# Patient Record
Sex: Male | Born: 1965 | Race: White | Hispanic: No | Marital: Married | Smoking: Former smoker
Health system: Southern US, Community
[De-identification: ages and names within clinical notes are randomized; demographics above are authoritative.]

## PROBLEM LIST (undated history)

## (undated) DIAGNOSIS — E785 Hyperlipidemia, unspecified: Secondary | ICD-10-CM

## (undated) DIAGNOSIS — R002 Palpitations: Secondary | ICD-10-CM

## (undated) DIAGNOSIS — R609 Edema, unspecified: Secondary | ICD-10-CM

## (undated) DIAGNOSIS — I447 Left bundle-branch block, unspecified: Secondary | ICD-10-CM

## (undated) DIAGNOSIS — I2510 Atherosclerotic heart disease of native coronary artery without angina pectoris: Secondary | ICD-10-CM

## (undated) DIAGNOSIS — E118 Type 2 diabetes mellitus with unspecified complications: Secondary | ICD-10-CM

## (undated) DIAGNOSIS — I700 Atherosclerosis of aorta: Secondary | ICD-10-CM

## (undated) DIAGNOSIS — I10 Essential (primary) hypertension: Secondary | ICD-10-CM

## (undated) DIAGNOSIS — G8929 Other chronic pain: Secondary | ICD-10-CM

## (undated) DIAGNOSIS — J449 Chronic obstructive pulmonary disease, unspecified: Secondary | ICD-10-CM

## (undated) DIAGNOSIS — M5135 Other intervertebral disc degeneration, thoracolumbar region: Secondary | ICD-10-CM

## (undated) DIAGNOSIS — K635 Polyp of colon: Secondary | ICD-10-CM

## (undated) DIAGNOSIS — Z1379 Encounter for other screening for genetic and chromosomal anomalies: Principal | ICD-10-CM

## (undated) DIAGNOSIS — M109 Gout, unspecified: Secondary | ICD-10-CM

## (undated) DIAGNOSIS — I872 Venous insufficiency (chronic) (peripheral): Secondary | ICD-10-CM

## (undated) DIAGNOSIS — E119 Type 2 diabetes mellitus without complications: Secondary | ICD-10-CM

## (undated) DIAGNOSIS — G4733 Obstructive sleep apnea (adult) (pediatric): Secondary | ICD-10-CM

## (undated) DIAGNOSIS — I471 Supraventricular tachycardia, unspecified: Secondary | ICD-10-CM

## (undated) DIAGNOSIS — M47814 Spondylosis without myelopathy or radiculopathy, thoracic region: Secondary | ICD-10-CM

## (undated) DIAGNOSIS — K219 Gastro-esophageal reflux disease without esophagitis: Secondary | ICD-10-CM

## (undated) DIAGNOSIS — C4492 Squamous cell carcinoma of skin, unspecified: Secondary | ICD-10-CM

## (undated) DIAGNOSIS — R600 Localized edema: Secondary | ICD-10-CM

## (undated) HISTORY — DX: Venous insufficiency (chronic) (peripheral): I87.2

## (undated) HISTORY — DX: Gout, unspecified: M10.9

## (undated) HISTORY — DX: Chronic obstructive pulmonary disease, unspecified: J44.9

## (undated) HISTORY — DX: Left bundle-branch block, unspecified: I44.7

## (undated) HISTORY — DX: Palpitations: R00.2

## (undated) HISTORY — PX: WISDOM TOOTH EXTRACTION: SHX21

## (undated) HISTORY — DX: Encounter for other screening for genetic and chromosomal anomalies: Z13.79

## (undated) HISTORY — DX: Supraventricular tachycardia, unspecified: I47.10

## (undated) HISTORY — DX: Squamous cell carcinoma of skin, unspecified: C44.92

## (undated) HISTORY — DX: Gastro-esophageal reflux disease without esophagitis: K21.9

## (undated) HISTORY — DX: Morbid (severe) obesity due to excess calories: E66.01

## (undated) HISTORY — DX: Polyp of colon: K63.5

## (undated) HISTORY — DX: Type 2 diabetes mellitus with unspecified complications: E11.8

---

## 2011-11-09 ENCOUNTER — Ambulatory Visit: Payer: Self-pay | Admitting: Family

## 2014-08-28 ENCOUNTER — Encounter: Payer: Self-pay | Admitting: Surgery

## 2014-09-08 ENCOUNTER — Encounter: Payer: Self-pay | Admitting: Surgery

## 2014-10-07 ENCOUNTER — Encounter
Admit: 2014-10-07 | Disposition: A | Discharge status: Home / Self Care | Payer: Self-pay | Attending: Surgery | Admitting: Surgery

## 2014-11-07 ENCOUNTER — Encounter
Admit: 2014-11-07 | Disposition: A | Discharge status: Home / Self Care | Payer: Self-pay | Attending: Surgery | Admitting: Surgery

## 2015-01-12 ENCOUNTER — Other Ambulatory Visit: Payer: Self-pay

## 2015-01-12 MED ORDER — PANTOPRAZOLE SODIUM 40 MG PO TBEC: 40.0000 mg | DELAYED_RELEASE_TABLET | Freq: Every day | ORAL | Status: DC

## 2015-01-12 MED ORDER — ALLOPURINOL 100 MG PO TABS: 100.0000 mg | ORAL_TABLET | Freq: Two times a day (BID) | ORAL | Status: DC

## 2015-01-12 NOTE — Telephone Encounter (Signed)
Dr. Manuella Ghazi approved both rx on printed rs refills and I sent them through system.

## 2015-03-05 ENCOUNTER — Telehealth: Payer: Self-pay | Admitting: Family Medicine

## 2015-03-05 MED ORDER — TORSEMIDE 20 MG PO TABS: 20.0000 mg | ORAL_TABLET | Freq: Two times a day (BID) | ORAL | Status: DC

## 2015-03-05 NOTE — Telephone Encounter (Signed)
Medication has been refilled and sent to [facility] and patient has an appointment scheduled for 05/11/2015

## 2015-04-20 ENCOUNTER — Telehealth: Payer: Self-pay | Admitting: Family Medicine

## 2015-04-20 ENCOUNTER — Other Ambulatory Visit: Payer: Self-pay

## 2015-04-20 MED ORDER — TORSEMIDE 20 MG PO TABS: 20.0000 mg | ORAL_TABLET | Freq: Two times a day (BID) | ORAL | Status: DC

## 2015-04-20 NOTE — Telephone Encounter (Signed)
Medication has been refilled and sent to [facility]

## 2015-04-20 NOTE — Telephone Encounter (Signed)
Patient requesting refill. 

## 2015-05-11 ENCOUNTER — Ambulatory Visit (INDEPENDENT_AMBULATORY_CARE_PROVIDER_SITE_OTHER): Payer: BLUE CROSS/BLUE SHIELD | Admitting: Family Medicine

## 2015-05-11 ENCOUNTER — Encounter: Payer: Self-pay | Admitting: Family Medicine

## 2015-05-11 VITALS — BP 140/76 | HR 77 | Temp 97.7°F | Resp 19 | Ht 70.0 in | Wt >= 6400 oz

## 2015-05-11 DIAGNOSIS — K219 Gastro-esophageal reflux disease without esophagitis: Secondary | ICD-10-CM | POA: Diagnosis not present

## 2015-05-11 DIAGNOSIS — M1A2720 Drug-induced chronic gout, left ankle and foot, without tophus (tophi): Secondary | ICD-10-CM

## 2015-05-11 DIAGNOSIS — J449 Chronic obstructive pulmonary disease, unspecified: Secondary | ICD-10-CM

## 2015-05-11 DIAGNOSIS — Z23 Encounter for immunization: Secondary | ICD-10-CM | POA: Diagnosis not present

## 2015-05-11 DIAGNOSIS — M109 Gout, unspecified: Secondary | ICD-10-CM | POA: Insufficient documentation

## 2015-05-11 DIAGNOSIS — E785 Hyperlipidemia, unspecified: Secondary | ICD-10-CM | POA: Insufficient documentation

## 2015-05-11 DIAGNOSIS — M7989 Other specified soft tissue disorders: Secondary | ICD-10-CM

## 2015-05-11 DIAGNOSIS — E7800 Pure hypercholesterolemia, unspecified: Secondary | ICD-10-CM

## 2015-05-11 MED ORDER — ALLOPURINOL 100 MG PO TABS: 100.0000 mg | ORAL_TABLET | Freq: Two times a day (BID) | ORAL | Status: DC

## 2015-05-11 MED ORDER — TORSEMIDE 20 MG PO TABS: 20.0000 mg | ORAL_TABLET | Freq: Two times a day (BID) | ORAL | Status: DC

## 2015-05-11 NOTE — Progress Notes (Signed)
Name: Michael Boyle   MRN: [id number]    DOB: [birth year]   Date:05/11/2015       Progress Note Subjective  Chief Complaint  Chief Complaint  Patient presents with  . Follow-up  . Edema  . Gastrophageal Reflux  . Gout  . COPD    Gastrophageal Reflux He complains of belching (frequent hiccups) and heartburn. He reports no abdominal pain, no chest pain, no coughing, no dysphagia, no nausea or no wheezing. This is a chronic problem. The problem has been gradually worsening. The symptoms are aggravated by certain foods. Pertinent negatives include no melena or weight loss. Risk factors include obesity. He has tried a PPI for the symptoms. The treatment provided moderate relief. Past procedures do not include an abdominal ultrasound or an EGD.  Bilateral Leg Swelling Chronic bilateral leg swelling, present for many years. Swelling when sedentary and resolves with walking. No shortness of breath, chest pain, or other symptoms.  Gout Chronic gout, no recent flare-ups. Last Uric Acid level was 8.7, following which the dose of Allopurinol was increased from 100 mg once daily to twice daily. Stable since then. Normal kidney function. COPD Pt. With chronic COPD, stable in regards to symptoms (no shortness of breath, chest pain, or wheezing). He has quit smoking since 2013, takes Advair once daily.   Past Medical History  Diagnosis Date  . GERD (gastroesophageal reflux disease)   . COPD (chronic obstructive pulmonary disease) ([place])    History reviewed. No pertinent past surgical history.  Family History  Problem Relation Age of Onset  . Cancer Sister     lymphoma  . Diabetes Sister   . Cirrhosis Sister     Social History   Social History  . Marital Status: Married    Spouse Name: N/A  . Number of Children: N/A  . Years of Education: N/A   Occupational History  . Not on file.   Social History Main Topics  . Smoking status: Former Smoker -- 3 years    Types: Cigarettes  .  Smokeless tobacco: Never Used  . Alcohol Use: No  . Drug Use: No  . Sexual Activity: Not on file   Other Topics Concern  . Not on file   Social History Narrative  . No narrative on file    Current outpatient prescriptions:  .  allopurinol (ZYLOPRIM) 100 MG tablet, Take 1 tablet (100 mg total) by mouth 2 (two) times daily., Disp: 180 tablet, Rfl: 1 .  aspirin 325 MG tablet, Take by mouth., Disp: , Rfl:  .  Fluticasone-Salmeterol (ADVAIR DISKUS) 500-50 MCG/DOSE AEPB, Inhale into the lungs., Disp: , Rfl:  .  pantoprazole (PROTONIX) 40 MG tablet, Take 1 tablet (40 mg total) by mouth daily., Disp: 90 tablet, Rfl: 1 .  potassium chloride SA (KLOR-CON M20) 20 MEQ tablet, Take by mouth., Disp: , Rfl:  .  torsemide (DEMADEX) 20 MG tablet, Take 1 tablet (20 mg total) by mouth 2 (two) times daily., Disp: 60 tablet, Rfl: 0 .  verapamil (CALAN-SR) 180 MG CR tablet, Take by mouth., Disp: , Rfl:   No Known Allergies   Review of Systems  Constitutional: Negative for fever, chills, weight loss and malaise/fatigue.  Respiratory: Negative for cough, shortness of breath and wheezing.   Cardiovascular: Negative for chest pain.  Gastrointestinal: Positive for heartburn. Negative for dysphagia, nausea, vomiting, abdominal pain, blood in stool and melena.  Musculoskeletal: Positive for back pain. Negative for joint pain.   Objective  Filed Vitals:  05/11/15 0813  BP: 140/76  Pulse: 77  Temp: 97.7 F (36.5 C)  TempSrc: Oral  Resp: 19  Height: 5\' 10"  (1.778 m)  Weight: 403 lb 11.2 oz (183.117 kg)  SpO2: 96%    Physical Exam  Constitutional: He is oriented to person, place, and time and well-developed, well-nourished, and in no distress.  Cardiovascular: Normal rate and regular rhythm.   Pulmonary/Chest: Effort normal and breath sounds normal.  Abdominal: Soft. Bowel sounds are normal.  Musculoskeletal: Normal range of motion.       Right ankle: He exhibits swelling. Tenderness.       Left  ankle: He exhibits swelling. Tenderness.  Neurological: He is alert and oriented to person, place, and time.  Nursing note and vitals reviewed.  Assessment & Plan  1. Need for immunization against influenza  - Flu Vaccine QUAD 36+ mos PF IM (Fluarix & Fluzone Quad PF)  2. Mild chronic obstructive pulmonary disease ([place]) Patient using Advair daily. Symptoms are stable.  3. Gastroesophageal reflux disease, esophagitis presence not specified Breakthrough and recurrent symptoms of acid reflux on high-dose PPI therapy. Patient has tried Dexilant in the past. Referral to gastroenterology for endoscopy. - Ambulatory referral to Gastroenterology  4. 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 - Uric acid - allopurinol (ZYLOPRIM) 100 MG tablet; Take 1 tablet (100 mg total) by mouth 2 (two) times daily.  Dispense: 180 tablet; Refill: 1  5. Hypercholesterolemia  - Comprehensive Metabolic Panel (CMET) - Lipid Profile  6. Morbid obesity due to excess calories [facility]) Patient admits to "binge eating", especially at night and referral to [place] for management of obesity with dietary and lifestyle changes. - Amb ref to Medical Nutrition Therapy-MNT  7. Leg swelling  - torsemide (DEMADEX) 20 MG tablet; Take 1 tablet (20 mg total) by mouth 2 (two) times daily.  Dispense: 180 tablet; Refill: 0   [name] [facility] 05/11/2015 8:28 AM

## 2015-05-12 LAB — COMPREHENSIVE METABOLIC PANEL
A/G RATIO: 1.2 (ref 1.1–2.5)
ALT: 27 IU/L (ref 0–44)
AST: 22 IU/L (ref 0–40)
Albumin: 4.1 g/dL (ref 3.5–5.5)
Alkaline Phosphatase: 77 IU/L (ref 39–117)
BILIRUBIN TOTAL: 0.4 mg/dL (ref 0.0–1.2)
BUN/Creatinine Ratio: 11 (ref 9–20)
BUN: 11 mg/dL (ref 6–24)
CO2: 27 mmol/L (ref 18–29)
Calcium: 9.4 mg/dL (ref 8.7–10.2)
Chloride: 98 mmol/L (ref 97–108)
Creatinine, Ser: 1.02 mg/dL (ref 0.76–1.27)
GFR calc Af Amer: 99 mL/min/{1.73_m2} (ref >59)
GFR, EST NON AFRICAN AMERICAN: 85 mL/min/{1.73_m2} (ref >59)
Globulin, Total: 3.3 g/dL (ref 1.5–4.5)
Glucose: 119 mg/dL — ABNORMAL HIGH (ref 65–99)
POTASSIUM: 4.4 mmol/L (ref 3.5–5.2)
SODIUM: 143 mmol/L (ref 134–144)
TOTAL PROTEIN: 7.4 g/dL (ref 6.0–8.5)

## 2015-05-12 LAB — URIC ACID: URIC ACID: 7.6 mg/dL (ref 3.7–8.6)

## 2015-05-12 LAB — LIPID PANEL
CHOL/HDL RATIO: 4.6 ratio (ref 0.0–5.0)
Cholesterol, Total: 167 mg/dL (ref 100–199)
HDL: 36 mg/dL — ABNORMAL LOW (ref >39)
LDL CALC: 104 mg/dL — AB (ref 0–99)
Triglycerides: 133 mg/dL (ref 0–149)
VLDL CHOLESTEROL CAL: 27 mg/dL (ref 5–40)

## 2015-06-08 ENCOUNTER — Encounter: Payer: BLUE CROSS/BLUE SHIELD | Attending: Family Medicine | Admitting: Dietician

## 2015-06-08 ENCOUNTER — Encounter: Payer: Self-pay | Admitting: Dietician

## 2015-06-08 NOTE — Patient Instructions (Addendum)
Balance meals with protein, 3-4 servings of carbohydrate and "free vegetables". Estimate carbohydrate servings at meals and snacks. Include 3-4 servings of carbohydrate at meals and 1-2 at snacks. Spread 15-17 servings of carbohydrate over 3 meals and 1-2snacks. Measure some portions of carbohydrate foods, especially starches. Read labels for carbohydrate grams remembering that 15 gms of carbohydrate equals one serving. If eating a higher fat food at a meal, balance with "free" vegetables, fruit or yogurt. Limit added fats such as salad dressings, mayonnaise, sour cream and margarine.

## 2015-06-08 NOTE — Progress Notes (Signed)
Medical Nutrition Therapy: Visit start time: 13:30  end time: 14:30 Assessment:  Diagnosis: obesity Past medical history: mild COPD, GERD,  Psychosocial issues/ stress concerns: Rates his stress as low and indicates "very well" as to how he is dealing with his stress. Preferred learning method:  . Visual  Current weight: 403.9 lbs  Height: 70 in Medications, supplements: see list Progress and evaluation: Patient in for initial nutrition assessment. Reports that he turned 39 in July and decided for his health he had better make some diet changes in order to lose weight. Reports that he has been overweight all his life, and stayed stable at 340-350 lbs for years until he quit smoking 3 years ago and has since gained at least 50 lbs. He has tried extremely carbohydrate restrictive diets in the past and has lost weight but always regains even more than his pre-diet weight once he starts re-introducing carbohydrates. On week days he eats all of his lunch meals "out" usually making high fat choices. He eats a smaller dinner but states that excessive snacking in the evening while watching TV is a problem area for him. He likes most fruits and vegetables but his diet is often low in both. He rates his motivation to lose weight as a "9" on a 0-10 scale.  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  Dietary Intake:  Usual eating pattern includes 2 meals and 3-4 snacks per day. Dining out frequency: 6 meals per week.  Breakfast: 7:30am- "nabs" , water or Sprite Lunch: 11:30am- [employer] - chicken pie or turkey/dressing with  mac.'n cheese, sweet potato casserole, or fried chicken sandwich, fries, soda or spaghetti, salad with thousand island drsg Supper: 2 ham or bologna sandwiches, chips, decaff. Tea or 2 Manwich sandwiches with tator tots or fries (baked) for example Snack: chips,cookies, candy Beverages: water, Sprite, Decaff.  tea  Nutrition Care Education:  Weight control: Instructed on a meal plan with a range of 2000-2200 calories to promote weight loss including carbohydrate counting and the need to balance carbohydrate, protein and non-starchy vegetables. Discussed calorie content of some of his high fat choices and discussed lower fat choices as well as combining a higher fat choice with a lower fat choice with examples. Encouraged him to take advantage of fact that he likes a variety of fruits/vegetables so these can be balanced with higher calorie foods.  Nutritional Diagnosis:  NI-1.5 Excessive energy intake As related to high calorie lunch meals and evening snacking.  As evidenced by diet history..  Intervention: Balance meals with protein, 3-4 servings of carbohydrate and "free vegetables". Estimate carbohydrate servings at meals and snacks. Include 3-4 servings of carbohydrate at meals and 1-2 at snacks. Spread 15-17 servings of carbohydrate over 3 meals and 1-2snacks. Measure some portions of carbohydrate foods, especially starches. Read labels for carbohydrate grams remembering that 15 gms of carbohydrate equals one serving. If eating a higher fat food at a meal, balance with "free" vegetables, fruit or yogurt. Limit added fats such as salad dressings, mayonnaise, sour cream and margarine.  Education Materials given:  .  Marland Kitchen Food lists/ Planning A Balanced Meal . Sample meal pattern/ menus . Goals/ instructions  Learner/ who was taught:  . Patient   Level of understanding: . Partial understanding; needs review/ practice Learning barriers: . None  Willingness to learn/ readiness for change: . Acceptance, ready for change  Monitoring and Evaluation:  Dietary intake, exercise,and body weight  follow up: 07/07/15 at 1:30pm

## 2015-06-18 DIAGNOSIS — I10 Essential (primary) hypertension: Secondary | ICD-10-CM | POA: Insufficient documentation

## 2015-06-18 DIAGNOSIS — I499 Cardiac arrhythmia, unspecified: Secondary | ICD-10-CM | POA: Insufficient documentation

## 2015-06-18 DIAGNOSIS — Z8679 Personal history of other diseases of the circulatory system: Secondary | ICD-10-CM | POA: Insufficient documentation

## 2015-07-07 ENCOUNTER — Ambulatory Visit: Payer: BLUE CROSS/BLUE SHIELD | Admitting: Dietician

## 2015-07-30 ENCOUNTER — Telehealth: Payer: Self-pay | Admitting: Family Medicine

## 2015-07-30 MED ORDER — PANTOPRAZOLE SODIUM 40 MG PO TBEC: 40.0000 mg | DELAYED_RELEASE_TABLET | Freq: Every day | ORAL | Status: DC

## 2015-07-30 NOTE — Telephone Encounter (Signed)
I have sent refill for 3 month supply to pt pharmacy.

## 2015-07-30 NOTE — Telephone Encounter (Signed)
Pt needs refill on Pentadrazole to be sent to [facility] in [place].

## 2015-07-30 NOTE — Telephone Encounter (Signed)
LMOM to inform pt °

## 2015-08-14 ENCOUNTER — Ambulatory Visit: Payer: BC Managed Care – PPO | Admitting: Anesthesiology

## 2015-08-14 ENCOUNTER — Ambulatory Visit
Admission: RE | Admit: 2015-08-14 | Discharge: 2015-08-14 | Disposition: A | Discharge status: Home / Self Care | Payer: BC Managed Care – PPO | Source: Ambulatory Visit | Attending: Gastroenterology | Admitting: Gastroenterology

## 2015-08-14 ENCOUNTER — Encounter: Payer: Self-pay | Admitting: *Deleted

## 2015-08-14 ENCOUNTER — Encounter
Admission: RE | Disposition: A | Discharge status: Home / Self Care | Payer: Self-pay | Source: Ambulatory Visit | Attending: Gastroenterology

## 2015-08-14 DIAGNOSIS — K635 Polyp of colon: Secondary | ICD-10-CM | POA: Diagnosis not present

## 2015-08-14 DIAGNOSIS — D122 Benign neoplasm of ascending colon: Secondary | ICD-10-CM | POA: Insufficient documentation

## 2015-08-14 DIAGNOSIS — Z7982 Long term (current) use of aspirin: Secondary | ICD-10-CM | POA: Insufficient documentation

## 2015-08-14 DIAGNOSIS — Z79899 Other long term (current) drug therapy: Secondary | ICD-10-CM | POA: Diagnosis not present

## 2015-08-14 DIAGNOSIS — K5730 Diverticulosis of large intestine without perforation or abscess without bleeding: Secondary | ICD-10-CM | POA: Insufficient documentation

## 2015-08-14 DIAGNOSIS — J449 Chronic obstructive pulmonary disease, unspecified: Secondary | ICD-10-CM | POA: Insufficient documentation

## 2015-08-14 DIAGNOSIS — K228 Other specified diseases of esophagus: Secondary | ICD-10-CM | POA: Insufficient documentation

## 2015-08-14 DIAGNOSIS — D124 Benign neoplasm of descending colon: Secondary | ICD-10-CM | POA: Diagnosis not present

## 2015-08-14 DIAGNOSIS — K621 Rectal polyp: Secondary | ICD-10-CM | POA: Insufficient documentation

## 2015-08-14 DIAGNOSIS — K219 Gastro-esophageal reflux disease without esophagitis: Secondary | ICD-10-CM | POA: Diagnosis present

## 2015-08-14 DIAGNOSIS — I4891 Unspecified atrial fibrillation: Secondary | ICD-10-CM | POA: Insufficient documentation

## 2015-08-14 DIAGNOSIS — D125 Benign neoplasm of sigmoid colon: Secondary | ICD-10-CM | POA: Diagnosis not present

## 2015-08-14 DIAGNOSIS — K2950 Unspecified chronic gastritis without bleeding: Secondary | ICD-10-CM | POA: Diagnosis not present

## 2015-08-14 DIAGNOSIS — Z1211 Encounter for screening for malignant neoplasm of colon: Secondary | ICD-10-CM | POA: Diagnosis not present

## 2015-08-14 DIAGNOSIS — D123 Benign neoplasm of transverse colon: Secondary | ICD-10-CM | POA: Insufficient documentation

## 2015-08-14 HISTORY — PX: ESOPHAGOGASTRODUODENOSCOPY (EGD) WITH PROPOFOL: SHX5813

## 2015-08-14 HISTORY — PX: COLONOSCOPY WITH PROPOFOL: SHX5780

## 2015-08-14 SURGERY — COLONOSCOPY WITH PROPOFOL
Anesthesia: General

## 2015-08-14 MED ORDER — GLYCOPYRROLATE 0.2 MG/ML IJ SOLN
INTRAMUSCULAR | Status: DC | PRN
  Administered 2015-08-14: 0.2 mg via INTRAVENOUS

## 2015-08-14 MED ORDER — PROPOFOL 10 MG/ML IV BOLUS
INTRAVENOUS | Status: DC | PRN
  Administered 2015-08-14: 40 mg via INTRAVENOUS
  Administered 2015-08-14 (×3): 50 mg via INTRAVENOUS
  Administered 2015-08-14: 40 mg via INTRAVENOUS
  Administered 2015-08-14: 50 mg via INTRAVENOUS
  Administered 2015-08-14: 150 mg via INTRAVENOUS

## 2015-08-14 MED ORDER — PROPOFOL 500 MG/50ML IV EMUL
INTRAVENOUS | Status: DC | PRN
  Administered 2015-08-14: 150 ug/kg/min via INTRAVENOUS

## 2015-08-14 MED ORDER — SODIUM CHLORIDE 0.9 % IV SOLN: INTRAVENOUS | Status: DC

## 2015-08-14 MED ORDER — ASPIRIN 325 MG PO TABS: 325.0000 mg | ORAL_TABLET | Freq: Every day | ORAL | Status: DC

## 2015-08-14 MED ORDER — LIDOCAINE HCL (CARDIAC) 20 MG/ML IV SOLN
INTRAVENOUS | Status: DC | PRN
  Administered 2015-08-14: 100 mg via INTRAVENOUS

## 2015-08-14 MED ORDER — SODIUM CHLORIDE 0.9 % IV SOLN
INTRAVENOUS | Status: DC
  Administered 2015-08-14 (×2): via INTRAVENOUS

## 2015-08-14 NOTE — Op Note (Signed)
[facility] Gastroenterology Patient Name: Michael Boyle Procedure Date: 08/14/2015 1:52 PM MRN: [id number] Account #: [id number] Date of Birth: June 11, 1966 Admit Type: Outpatient Age: 50 Room: [facility] ENDO ROOM 3 Gender: Male Note Status: Finalized Procedure:         Colonoscopy Indications:       Screening for colorectal malignant neoplasm, This is the                     patient's first colonoscopy Providers:         Lollie Sails, MD Referring MD:      Otila Back. Manuella Ghazi (Referring MD) Medicines:         Monitored Anesthesia Care Complications:     No immediate complications. Procedure:         Pre-Anesthesia Assessment:                    - ASA Grade Assessment: III - A patient with severe                     systemic disease.                    After obtaining informed consent, the colonoscope was                     passed under direct vision. Throughout the procedure, the                     patient's blood pressure, pulse, and oxygen saturations                     were monitored continuously. The Olympus PCF-H180AL                     colonoscope ( S#: [id number] ) was introduced through the                     anus and advanced to the the ileocecal valve. The                     colonoscopy was unusually difficult due to poor bowel                     prep, significant looping, a tortuous colon and the                     patient's body habitus. Successful completion of the                     procedure was aided by applying abdominal pressure and                     lavage. Findings:      A 3 mm polyp was found in the sigmoid colon. The polyp was sessile. The       polyp was removed with a cold biopsy forceps. Resection and retrieval       were complete.      A 3 mm polyp was found in the descending colon. The polyp was sessile.       The polyp was removed with a cold biopsy forceps. Resection and       retrieval were complete.      A 5 mm polyp was found  in the transverse colon. The polyp was  pedunculated. The polyp was removed with a cold snare. Resection and       retrieval were complete.      A 6 mm polyp was found in the proximal transverse colon. The polyp was       pedunculated. The polyp was removed with a cold snare. Resection and       retrieval were complete.      A 9 mm polyp was found in the proximal transverse colon. The polyp was       pedunculated. The polyp was removed with a hot snare. Resection and       retrieval were complete.      A 4 mm polyp was found in the proximal transverse colon. The polyp was       sessile. The polyp was removed with a cold snare. Resection and       retrieval were complete.      Two pedunculated polyps were found in the distal ascending colon. The       polyps were 3 to 6 mm in size. These polyps were removed with a cold       snare. Resection and retrieval were complete.      A 10 mm polyp was found in the distal ascending colon. The polyp was       sessile. The polyp was removed with a hot snare. Resection and retrieval       were complete. For hemostasis, one hemostatic clip was successfully       placed. There was no bleeding at the end of the maneuver.      A 11 mm polyp was found in the transverse colon. The polyp was       pedunculated. The polyp was removed with a hot snare. Resection and       retrieval were complete. For hemostasis, one hemostatic clip was       successfully placed. There was no bleeding at the end of the maneuver.      Four pedunculated polyps were found at the splenic flexure. The polyps       were 3 to 8 mm in size. These polyps were removed with a hot snare.       Resection and retrieval were complete. For hemostasis, one hemostatic       clip was successfully placed. There was no bleeding at the end of the       maneuver.      A 13 mm polyp was found in the descending colon. The polyp was       semi-pedunculated. The polyp was removed with a hot snare.  Resection and       retrieval were complete. For hemostasis, one hemostatic clip was       successfully placed. There was no bleeding at the end of the maneuver.      A 5 mm polyp was found in the mid descending colon. The polyp was       semi-pedunculated. The polyp was removed with a cold snare. Resection       and retrieval were complete.      A 3 mm polyp was found in the distal descending colon. The polyp was       sessile. The polyp was removed with a cold biopsy forceps. Resection and       retrieval were complete.      Two sessile polyps were found in the rectum. The polyps were 2 mm in  size. These polyps were removed with a cold biopsy forceps. Resection       and retrieval were complete.      The digital rectal exam was normal.      Multiple medium-mouthed diverticula were found in the sigmoid colon and       in the descending colon. Impression:        - One 3 mm polyp in the sigmoid colon. Resected and                     retrieved.                    - One 3 mm polyp in the descending colon. Resected and                     retrieved.                    - One 5 mm polyp in the transverse colon. Resected and                     retrieved.                    - One 6 mm polyp in the proximal transverse colon.                     Resected and retrieved.                    - One 9 mm polyp in the proximal transverse colon.                     Resected and retrieved.                    - One 4 mm polyp in the proximal transverse colon.                     Resected and retrieved.                    - Two 3 to 6 mm polyps in the distal ascending colon.                     Resected and retrieved.                    - One 10 mm polyp in the distal ascending colon. Resected                     and retrieved. Clip was placed.                    - One 11 mm polyp in the transverse colon. Resected and                     retrieved. Clip was placed.                    - Four 3 to  8 mm polyps at the splenic flexure. Resected                     and retrieved. Clip was placed.                    - One 13 mm polyp in  the descending colon. Resected and                     retrieved. Clip was placed.                    - One 5 mm polyp in the mid descending colon. Resected and                     retrieved.                    - One 3 mm polyp in the distal descending colon. Resected                     and retrieved.                    - Two 2 mm polyps in the rectum. Resected and retrieved. Recommendation:    - Discharge patient to home.                    - Clear liquid diet for 2 days.                    - Full liquid diet for 3 days.                    - Soft diet for 1 week. Procedure Code(s): --- Professional ---                    [phone], Colonoscopy, flexible; with removal of tumor(s),                     polyp(s), or other lesion(s) by snare technique                    [id number], 45, Colonoscopy, flexible; with biopsy, single or                     multiple CPT copyright 2014 American Medical Association. All rights reserved. The codes documented in this report are preliminary and upon coder review may  be revised to meet current compliance requirements. Lollie Sails, MD 08/14/2015 3:58:09 PM This report has been signed electronically. Number of Addenda: 0 Note Initiated On: 08/14/2015 1:52 PM Total Procedure Duration: 1 hour 31 minutes 15 seconds       [facility]

## 2015-08-14 NOTE — Transfer of Care (Signed)
Immediate Anesthesia Transfer of Care Note  Patient: Michael Boyle  Procedure(s) Performed: Procedure(s): COLONOSCOPY WITH PROPOFOL (N/A) ESOPHAGOGASTRODUODENOSCOPY (EGD) WITH PROPOFOL (N/A)  Patient Location: Endoscopy Unit  Anesthesia Type:General  Level of Consciousness: awake and alert   Airway & Oxygen Therapy: Patient Spontanous Breathing and Patient connected to nasal cannula oxygen  Post-op Assessment: Report given to RN and Post -op Vital signs reviewed and stable  Post vital signs: Reviewed and stable  Last Vitals:  Filed Vitals:   08/14/15 1326  BP: 157/83  Pulse: 67  Temp: 36.7 C  Resp: 18    Complications: No apparent anesthesia complications

## 2015-08-14 NOTE — Anesthesia Preprocedure Evaluation (Signed)
Anesthesia Evaluation  Patient identified by MRN, date of birth, ID band Patient awake    Reviewed: Allergy & Precautions, H&P , NPO status , Patient's Chart, lab work & pertinent test results, reviewed documented beta blocker date and time   History of Anesthesia Complications Negative for: history of anesthetic complications  Airway Mallampati: II  TM Distance: >3 FB Neck ROM: full    Dental no notable dental hx. (+) Partial Upper, Missing   Pulmonary neg shortness of breath, sleep apnea and Continuous Positive Airway Pressure Ventilation , COPD, neg recent URI, former smoker,    Pulmonary exam normal breath sounds clear to auscultation       Cardiovascular Exercise Tolerance: Good (-) hypertension(-) angina(-) CAD, (-) Past MI, (-) Cardiac Stents and (-) CABG Normal cardiovascular exam+ dysrhythmias Atrial Fibrillation (-) Valvular Problems/Murmurs Rhythm:regular Rate:Normal     Neuro/Psych negative neurological ROS  negative psych ROS   GI/Hepatic Neg liver ROS, GERD  Medicated and Controlled,  Endo/Other  neg diabetesMorbid obesity  Renal/GU negative Renal ROS  negative genitourinary   Musculoskeletal   Abdominal   Peds  Hematology negative hematology ROS (+)   Anesthesia Other Findings Past Medical History:   GERD (gastroesophageal reflux disease)                       COPD (chronic obstructive pulmonary disease) (*              Atrial fibrillation (HCC)                                    Reproductive/Obstetrics negative OB ROS                             Anesthesia Physical Anesthesia Plan  ASA: III  Anesthesia Plan: General   Post-op Pain Management:    Induction:   Airway Management Planned:   Additional Equipment:   Intra-op Plan:   Post-operative Plan:   Informed Consent: I have reviewed the patients History and Physical, chart, labs and discussed the procedure  including the risks, benefits and alternatives for the proposed anesthesia with the patient or authorized representative who has indicated his/her understanding and acceptance.   Dental Advisory Given  Plan Discussed with: Anesthesiologist, CRNA and Surgeon  Anesthesia Plan Comments:         Anesthesia Quick Evaluation

## 2015-08-14 NOTE — H&P (Addendum)
Outpatient short stay form Pre-procedure 08/14/2015 1:52 PM Lollie Sails MD  Primary Physician: Dr. Keith Rake  Reason for visit:  EGD and colonoscopy  History of present illness:  Patient is a 50 year old male presenting today for screening colonoscopy as well as further evaluation in regards to his reflux symptoms. He seems worse over the period past 6 months or so. He is currently taking Protonix 40 mg once a day. He tolerated his prep well. Stop taking aspirin about 10 days ago.  He denies use of other aspirin products or blood thinning agents.   Current facility-administered medications:  .  0.9 %  sodium chloride infusion, , Intravenous, Continuous, Lollie Sails, MD, Last Rate: 20 mL/hr at 08/14/15 1344 .  0.9 %  sodium chloride infusion, , Intravenous, Continuous, Lollie Sails, MD  Prescriptions prior to admission  Medication Sig Dispense Refill Last Dose  . allopurinol (ZYLOPRIM) 100 MG tablet Take 1 tablet (100 mg total) by mouth 2 (two) times daily. 180 tablet 1 08/13/2015 at 2100  . verapamil (CALAN-SR) 180 MG CR tablet Take by mouth.   08/13/2015 at 2100  . aspirin 325 MG tablet Take by mouth.   08/03/2015  . Fluticasone-Salmeterol (ADVAIR DISKUS) 500-50 MCG/DOSE AEPB Inhale into the lungs.   Taking  . pantoprazole (PROTONIX) 40 MG tablet Take 1 tablet (40 mg total) by mouth daily. 90 tablet 0   . potassium chloride SA (KLOR-CON M20) 20 MEQ tablet Take by mouth.   Taking  . torsemide (DEMADEX) 20 MG tablet Take 1 tablet (20 mg total) by mouth 2 (two) times daily. 180 tablet 0      No Known Allergies   Past Medical History  Diagnosis Date  . GERD (gastroesophageal reflux disease)   . COPD (chronic obstructive pulmonary disease) ([place])   . Atrial fibrillation ([place])     Review of systems:      Physical Exam    Heart and lungs: Regular rate and rhythm without rub or gallop, lungs are bilaterally clear   HEENT: Normocephalic atraumatic eyes are anicteric  Other: Morbidly obese   Pertinant exam for procedure: Regular rate and rhythm without rub or gallop lungs bilaterally clear abdomen is nontender bowel sounds are positive. Is not possible to palpate internal organs.   Planned proceedures: EGD and colonoscopy with indicated procedures.   Lollie Sails, MD Gastroenterology 08/14/2015  1:52 PM      I have discussed the risks benefits and complications of procedures to include not limited to bleeding, infection, perforation and the risk of sedation and the patient wishes to proceed. I have discussed the risks benefits and complications of procedures to include not limited to bleeding, infection, perforation and the risk of sedation and the patient wishes to proceed.

## 2015-08-14 NOTE — Op Note (Signed)
[facility] Gastroenterology Patient Name: Michael Boyle Procedure Date: 08/14/2015 1:52 PM MRN: [id number] Account #: [id number] Date of Birth: 09/07/1965 Admit Type: Outpatient Age: 50 Room: [facility] ENDO ROOM 3 Gender: Male Note Status: Finalized Procedure:         Upper GI endoscopy Indications:       Gastro-esophageal reflux disease Providers:         Lollie Sails, MD Referring MD:      Otila Back. Manuella Ghazi (Referring MD) Medicines:         Monitored Anesthesia Care Complications:     No immediate complications. Procedure:         Pre-Anesthesia Assessment:                    - ASA Grade Assessment: III - A patient with severe                     systemic disease.                    After obtaining informed consent, the endoscope was passed                     under direct vision. Throughout the procedure, the                     patient's blood pressure, pulse, and oxygen saturations                     were monitored continuously. The Endoscope was introduced                     through the mouth, and advanced to the third part of                     duodenum. The patient tolerated the procedure well. Findings:      The Z-line was irregular. Biopsies were taken with a cold forceps for       histology.      The exam of the esophagus was otherwise normal.      Patchy mild inflammation characterized by congestion (edema) and       erythema was found in the gastric antrum. Biopsies were taken with a       cold forceps for histology. Biopsies were taken with a cold forceps for       Helicobacter pylori testing.      Minimal inflammation characterized by congestion (edema) was found in       the gastric body. Biopsies were taken with a cold forceps for histology.      The cardia and gastric fundus were normal on retroflexion.      The examined duodenum was normal. Impression:        - Z-line irregular. Biopsied.                    - Gastritis. Biopsied.         - Gastritis. Biopsied.                    - Normal examined duodenum. Recommendation:    - Return to GI clinic in 1 month.                    - Use Protonix (pantoprazole) 40 mg PO BID daily. Procedure Code(s): --- Professional ---  [id number], Esophagogastroduodenoscopy, flexible, transoral;                     with biopsy, single or multiple Diagnosis Code(s): --- Professional ---                    K22.8, Other specified diseases of esophagus                    K29.70, Gastritis, unspecified, without bleeding                    K21.9, Gastro-esophageal reflux disease without esophagitis CPT copyright 2014 American Medical Association. All rights reserved. The codes documented in this report are preliminary and upon coder review may  be revised to meet current compliance requirements. Lollie Sails, MD 08/14/2015 2:10:40 PM This report has been signed electronically. Number of Addenda: 0 Note Initiated On: 08/14/2015 1:52 PM      [facility]

## 2015-08-16 NOTE — Anesthesia Postprocedure Evaluation (Signed)
Anesthesia Post Note  Patient: Louann Liv  Procedure(s) Performed: Procedure(s) (LRB): COLONOSCOPY WITH PROPOFOL (N/A) ESOPHAGOGASTRODUODENOSCOPY (EGD) WITH PROPOFOL (N/A)  Patient location during evaluation: Endoscopy Anesthesia Type: General Level of consciousness: awake and alert Pain management: pain level controlled Vital Signs Assessment: post-procedure vital signs reviewed and stable Respiratory status: spontaneous breathing, nonlabored ventilation, respiratory function stable and patient connected to nasal cannula oxygen Cardiovascular status: blood pressure returned to baseline and stable Postop Assessment: no signs of nausea or vomiting Anesthetic complications: no    Last Vitals:  Filed Vitals:   08/14/15 1609 08/14/15 1619  BP: 116/71 133/75  Pulse: 54 58  Temp:    Resp: 15 15    Last Pain:  Filed Vitals:   08/15/15 1019  PainSc: 0-No pain                 Martha Clan

## 2015-08-18 LAB — SURGICAL PATHOLOGY

## 2015-08-20 ENCOUNTER — Encounter: Payer: Self-pay | Admitting: Gastroenterology

## 2015-08-26 ENCOUNTER — Ambulatory Visit: Payer: Self-pay | Admitting: Dietician

## 2015-08-28 ENCOUNTER — Encounter: Payer: Self-pay | Admitting: Dietician

## 2015-08-28 ENCOUNTER — Other Ambulatory Visit: Payer: Self-pay | Admitting: Family Medicine

## 2015-08-28 DIAGNOSIS — M7989 Other specified soft tissue disorders: Secondary | ICD-10-CM

## 2015-08-28 NOTE — Telephone Encounter (Signed)
Patient is requesting refill on Verapamil and torsemide. Stated that the pharmacy has been requesting these prescriptions since last week. He will be completely out of verapamil by Monday. Please send to [facility]

## 2015-09-01 MED ORDER — TORSEMIDE 20 MG PO TABS: 20.0000 mg | ORAL_TABLET | Freq: Two times a day (BID) | ORAL | Status: DC

## 2015-09-01 MED ORDER — VERAPAMIL HCL ER 180 MG PO TBCR: 180.0000 mg | EXTENDED_RELEASE_TABLET | Freq: Every day | ORAL | Status: DC

## 2015-09-01 NOTE — Telephone Encounter (Signed)
Medication has been refilled and sent to [facility]

## 2015-09-01 NOTE — Telephone Encounter (Signed)
PT HAS CALLED AGAIN DUE TO HAS NOT RECEIVED ANY RESPONSE BACK ABOUT HIS MEDICATION REQUEST. HE WILL BE OUT TONIGHT OF HIS VERAPAMIL ( FOR RAPID HEART BEAT)Also, he is going to need the other rx to but has a few of them left. HE SAYS THAT THE PHARM ( [facility] IN [place]) HAS ALSO SENT OVER REQUEST WITH NO ANSWER BACK PER PATIENT.

## 2015-09-14 ENCOUNTER — Other Ambulatory Visit: Payer: Self-pay

## 2015-09-14 MED ORDER — VERAPAMIL HCL ER 180 MG PO TBCR: 180.0000 mg | EXTENDED_RELEASE_TABLET | Freq: Two times a day (BID) | ORAL | Status: DC

## 2015-09-23 ENCOUNTER — Encounter: Payer: Self-pay | Admitting: Family Medicine

## 2015-09-23 ENCOUNTER — Ambulatory Visit
Admission: RE | Admit: 2015-09-23 | Discharge: 2015-09-23 | Disposition: A | Discharge status: Home / Self Care | Payer: BC Managed Care – PPO | Source: Ambulatory Visit | Attending: Family Medicine | Admitting: Family Medicine

## 2015-09-23 ENCOUNTER — Ambulatory Visit (INDEPENDENT_AMBULATORY_CARE_PROVIDER_SITE_OTHER): Payer: BC Managed Care – PPO | Admitting: Family Medicine

## 2015-09-23 VITALS — BP 132/79 | HR 68 | Temp 98.0°F | Resp 18 | Ht 71.0 in | Wt >= 6400 oz

## 2015-09-23 DIAGNOSIS — G8929 Other chronic pain: Secondary | ICD-10-CM

## 2015-09-23 DIAGNOSIS — I471 Supraventricular tachycardia: Secondary | ICD-10-CM | POA: Insufficient documentation

## 2015-09-23 DIAGNOSIS — M546 Pain in thoracic spine: Secondary | ICD-10-CM | POA: Diagnosis not present

## 2015-09-23 DIAGNOSIS — R9431 Abnormal electrocardiogram [ECG] [EKG]: Secondary | ICD-10-CM

## 2015-09-23 DIAGNOSIS — E876 Hypokalemia: Secondary | ICD-10-CM | POA: Diagnosis not present

## 2015-09-23 DIAGNOSIS — M1A2720 Drug-induced chronic gout, left ankle and foot, without tophus (tophi): Secondary | ICD-10-CM | POA: Diagnosis not present

## 2015-09-23 DIAGNOSIS — M7989 Other specified soft tissue disorders: Secondary | ICD-10-CM

## 2015-09-23 DIAGNOSIS — E7800 Pure hypercholesterolemia, unspecified: Secondary | ICD-10-CM | POA: Diagnosis not present

## 2015-09-23 DIAGNOSIS — I479 Paroxysmal tachycardia, unspecified: Secondary | ICD-10-CM

## 2015-09-23 DIAGNOSIS — T502X5A Adverse effect of carbonic-anhydrase inhibitors, benzothiadiazides and other diuretics, initial encounter: Secondary | ICD-10-CM

## 2015-09-23 MED ORDER — VERAPAMIL HCL ER 180 MG PO TBCR: 180.0000 mg | EXTENDED_RELEASE_TABLET | Freq: Two times a day (BID) | ORAL | Status: DC

## 2015-09-23 MED ORDER — POTASSIUM CHLORIDE CRYS ER 20 MEQ PO TBCR: 20.0000 meq | EXTENDED_RELEASE_TABLET | Freq: Every day | ORAL | Status: DC

## 2015-09-23 MED ORDER — ALLOPURINOL 100 MG PO TABS: 100.0000 mg | ORAL_TABLET | Freq: Two times a day (BID) | ORAL | Status: DC

## 2015-09-23 MED ORDER — TORSEMIDE 20 MG PO TABS: 20.0000 mg | ORAL_TABLET | Freq: Two times a day (BID) | ORAL | Status: DC

## 2015-09-23 NOTE — Progress Notes (Signed)
Name: Michael Boyle   MRN: [id number]    DOB: Jan 16, 1966   Date:09/23/2015       Progress Note  Subjective  Chief Complaint  Chief Complaint  Patient presents with  . Follow-up    6 mo  . Gastroesophageal Reflux  . Gout    HPI  Leg Swelling Bilateral leg swelling, uses support hoses, takes Torsemide daily, relieves swelling.  Gout No active attack at this time, long history of gout. Last Uric Acid level was 7.6 in October 2016. He does eat some red meat but does eat cheese. Taking Allopurinol BID. Tachycardia  Pt. Has long history of tachycardia (over 15 years), controlled on Verapamil 180mg  two tablets at bedtime. Last episode of tachycardia was 3-4 months ago, lasted for 10 seconds or more. Does not see a Film/video editor.  Past Medical History  Diagnosis Date  . GERD (gastroesophageal reflux disease)   . COPD (chronic obstructive pulmonary disease) ([place])   . Atrial fibrillation [facility])     Past Surgical History  Procedure Laterality Date  . Wisdom tooth extraction    . Colonoscopy with propofol N/A 08/14/2015    Procedure: COLONOSCOPY WITH PROPOFOL;  Surgeon: Lollie Sails, MD;  Location: [facility] ENDOSCOPY;  Service: Endoscopy;  Laterality: N/A;  . Esophagogastroduodenoscopy (egd) with propofol N/A 08/14/2015    Procedure: ESOPHAGOGASTRODUODENOSCOPY (EGD) WITH PROPOFOL;  Surgeon: Lollie Sails, MD;  Location: [facility] ENDOSCOPY;  Service: Endoscopy;  Laterality: N/A;    Family History  Problem Relation Age of Onset  . Cancer Sister     lymphoma  . Diabetes Sister   . Cirrhosis Sister     Social History   Social History  . Marital Status: Married    Spouse Name: N/A  . Number of Children: N/A  . Years of Education: N/A   Occupational History  . Not on file.   Social History Main Topics  . Smoking status: Former Smoker -- 3 years    Types: Cigarettes    Quit date: 06/13/2012  . Smokeless tobacco: Never Used  . Alcohol Use: No  . Drug Use: No  . Sexual  Activity: Not on file   Other Topics Concern  . Not on file   Social History Narrative     Current outpatient prescriptions:  .  allopurinol (ZYLOPRIM) 100 MG tablet, Take 1 tablet (100 mg total) by mouth 2 (two) times daily., Disp: 180 tablet, Rfl: 1 .  aspirin 325 MG tablet, Take 1 tablet (325 mg total) by mouth daily. Do not restart for 6 days after colonoscopy., Disp: 30 tablet, Rfl: 11 .  Fluticasone-Salmeterol (ADVAIR DISKUS) 500-50 MCG/DOSE AEPB, Inhale into the lungs., Disp: , Rfl:  .  pantoprazole (PROTONIX) 40 MG tablet, Take 1 tablet (40 mg total) by mouth daily., Disp: 90 tablet, Rfl: 0 .  potassium chloride SA (KLOR-CON M20) 20 MEQ tablet, Take by mouth., Disp: , Rfl:  .  torsemide (DEMADEX) 20 MG tablet, Take 1 tablet (20 mg total) by mouth 2 (two) times daily., Disp: 60 tablet, Rfl: 0 .  verapamil (CALAN-SR) 180 MG CR tablet, Take 1 tablet (180 mg total) by mouth 2 (two) times daily., Disp: 60 tablet, Rfl: 0  No Known Allergies   Review of Systems  Constitutional: Negative for fever, chills and weight loss.  Cardiovascular: Negative for chest pain and palpitations.  Gastrointestinal: Negative for heartburn and abdominal pain.  Musculoskeletal: Positive for back pain (Upper back pain on-going for several months, mostly at night, mostly supine,  improves with activity, No radiclular symptoms.). Negative for joint pain.    Objective  Filed Vitals:   09/23/15 0846  BP: 132/79  Pulse: 68  Temp: 98 F (36.7 C)  TempSrc: Oral  Resp: 18  Height: 5\' 11"  (1.803 m)  Weight: 401 lb (181.892 kg)  SpO2: 96%    Physical Exam  Constitutional: He is oriented to person, place, and time and well-developed, well-nourished, and in no distress.  Cardiovascular: Normal rate and regular rhythm.   Pulmonary/Chest: Effort normal and breath sounds normal.  Musculoskeletal: He exhibits edema (1+ pitting edema).       Thoracic back: He exhibits tenderness. He exhibits no pain and no  spasm.       Back:  Neurological: He is alert and oriented to person, place, and time.  Nursing note and vitals reviewed.       Assessment & Plan  1. 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 Discussed dietary measures to avoid foods which in uric acid. Repeat levels today - Uric acid - allopurinol (ZYLOPRIM) 100 MG tablet; Take 1 tablet (100 mg total) by mouth 2 (two) times daily.  Dispense: 180 tablet; Refill: 1  2. Hypercholesterolemia LDL above goal at 104, recheck today, and calculate 10 year risk of CVD and consider starting on statin tx. - Lipid Profile  3. Leg swelling  - torsemide (DEMADEX) 20 MG tablet; Take 1 tablet (20 mg total) by mouth 2 (two) times daily.  Dispense: 60 tablet; Refill: 0  4. Tachycardia, paroxysmal (HCC)  - EKG 12-Lead - verapamil (CALAN-SR) 180 MG CR tablet; Take 1 tablet (180 mg total) by mouth 2 (two) times daily.  Dispense: 60 tablet; Refill: 0  5. Diuretic-induced hypokalemia  - Comprehensive Metabolic Panel (CMET) - potassium chloride SA (KLOR-CON M20) 20 MEQ tablet; Take 1 tablet (20 mEq total) by mouth daily.  Dispense: 90 tablet; Refill: 1  6. Chronic right-sided thoracic back pain Likely musculoskeletal. Rule out arthritis - DG Thoracic Spine W/Swimmers; Future  7. Abnormal finding on EKG EKG shows LBBB. No prior EKG available for comparison. Referral to cardiology - Ambulatory referral to Cardiology   [facility] 09/23/2015 8:55 AM

## 2015-09-24 LAB — LIPID PANEL
CHOL/HDL RATIO: 5 ratio (ref 0.0–5.0)
Cholesterol, Total: 184 mg/dL (ref 100–199)
HDL: 37 mg/dL — AB (ref >39)
LDL Calculated: 119 mg/dL — ABNORMAL HIGH (ref 0–99)
Triglycerides: 138 mg/dL (ref 0–149)
VLDL CHOLESTEROL CAL: 28 mg/dL (ref 5–40)

## 2015-09-24 LAB — COMPREHENSIVE METABOLIC PANEL
A/G RATIO: 1.4 (ref 1.1–2.5)
ALT: 24 IU/L (ref 0–44)
AST: 17 IU/L (ref 0–40)
Albumin: 4.3 g/dL (ref 3.5–5.5)
Alkaline Phosphatase: 88 IU/L (ref 39–117)
BUN/Creatinine Ratio: 10 (ref 9–20)
BUN: 10 mg/dL (ref 6–24)
Bilirubin Total: 0.4 mg/dL (ref 0.0–1.2)
CO2: 26 mmol/L (ref 18–29)
Calcium: 9.1 mg/dL (ref 8.7–10.2)
Chloride: 97 mmol/L (ref 96–106)
Creatinine, Ser: 0.98 mg/dL (ref 0.76–1.27)
GFR calc non Af Amer: 90 mL/min/{1.73_m2} (ref >59)
GFR, EST AFRICAN AMERICAN: 103 mL/min/{1.73_m2} (ref >59)
Globulin, Total: 3 g/dL (ref 1.5–4.5)
Glucose: 106 mg/dL — ABNORMAL HIGH (ref 65–99)
POTASSIUM: 4.3 mmol/L (ref 3.5–5.2)
Sodium: 140 mmol/L (ref 134–144)
TOTAL PROTEIN: 7.3 g/dL (ref 6.0–8.5)

## 2015-09-24 LAB — URIC ACID: URIC ACID: 7.1 mg/dL (ref 3.7–8.6)

## 2015-10-01 ENCOUNTER — Encounter: Payer: Self-pay | Admitting: *Deleted

## 2015-11-09 ENCOUNTER — Ambulatory Visit: Payer: BLUE CROSS/BLUE SHIELD | Admitting: Family Medicine

## 2015-11-13 ENCOUNTER — Encounter: Payer: Self-pay | Admitting: Cardiovascular Disease

## 2015-11-13 ENCOUNTER — Ambulatory Visit (INDEPENDENT_AMBULATORY_CARE_PROVIDER_SITE_OTHER): Payer: BC Managed Care – PPO | Admitting: Cardiovascular Disease

## 2015-11-13 ENCOUNTER — Other Ambulatory Visit: Payer: Self-pay | Admitting: Family Medicine

## 2015-11-13 VITALS — BP 138/74 | HR 58 | Ht 71.0 in | Wt >= 6400 oz

## 2015-11-13 DIAGNOSIS — I479 Paroxysmal tachycardia, unspecified: Secondary | ICD-10-CM | POA: Diagnosis not present

## 2015-11-13 DIAGNOSIS — R9431 Abnormal electrocardiogram [ECG] [EKG]: Secondary | ICD-10-CM

## 2015-11-13 DIAGNOSIS — R0602 Shortness of breath: Secondary | ICD-10-CM

## 2015-11-13 NOTE — Patient Instructions (Signed)
Medication Instructions:  Your physician recommends that you continue on your current medications as directed. Please refer to the Current Medication list given to you today.   Labwork: none  Testing/Procedures: Your physician has requested that you have an echocardiogram. Echocardiography is a painless test that uses sound waves to create images of your heart. It provides your doctor with information about the size and shape of your heart and how well your heart's chambers and valves are working. This procedure takes approximately one hour. There are no restrictions for this procedure.    Follow-Up: Your physician recommends that you schedule a follow-up appointment as needed.    Any Other Special Instructions Will Be Listed Below (If Applicable).     If you need a refill on your cardiac medications before your next appointment, please call your pharmacy.  Echocardiogram An echocardiogram, or echocardiography, uses sound waves (ultrasound) to produce an image of your heart. The echocardiogram is simple, painless, obtained within a short period of time, and offers valuable information to your health care provider. The images from an echocardiogram can provide information such as:  Evidence of coronary artery disease (CAD).  Heart size.  Heart muscle function.  Heart valve function.  Aneurysm detection.  Evidence of a past heart attack.  Fluid buildup around the heart.  Heart muscle thickening.  Assess heart valve function. LET YOUR HEALTH CARE PROVIDER KNOW ABOUT:  Any allergies you have.  All medicines you are taking, including vitamins, herbs, eye drops, creams, and over-the-counter medicines.  Previous problems you or members of your family have had with the use of anesthetics.  Any blood disorders you have.  Previous surgeries you have had.  Medical conditions you have.  Possibility of pregnancy, if this applies. BEFORE THE PROCEDURE  No special  preparation is needed. Eat and drink normally.  PROCEDURE   In order to produce an image of your heart, gel will be applied to your chest and a wand-like tool (transducer) will be moved over your chest. The gel will help transmit the sound waves from the transducer. The sound waves will harmlessly bounce off your heart to allow the heart images to be captured in real-time motion. These images will then be recorded.  You may need an IV to receive a medicine that improves the quality of the pictures. AFTER THE PROCEDURE You may return to your normal schedule including diet, activities, and medicines, unless your health care provider tells you otherwise.   This information is not intended to replace advice given to you by your health care provider. Make sure you discuss any questions you have with your health care provider.   Document Released: 07/22/2000 Document Revised: 08/15/2014 Document Reviewed: 04/01/2013 Elsevier Interactive Patient Education 2016 Elsevier Inc.  

## 2015-11-13 NOTE — Progress Notes (Signed)
Cardiology Office Note   Date:  11/13/2015   ID:  Michael Boyle, DOB 02-02-1966, MRN [id number]  PCP:  Keith Rake, MD  Cardiologist:  New   Chief Complaint  Patient presents with  . other    Ref by Dr. Manuella Ghazi for abnormal EKG. Pt. has been to Dr. Ubaldo Glassing in the past about 10 years ago for rapid heart beats. Pt. c/o rapid heart beats at times.       History of Present Illness: Michael Boyle is a 50 y.o. male who was referred by Dr. Manuella Ghazi for evaluation of abnormal EKG. The patient reports previous history of paroxysmal tachycardia likely paroxysmal supraventricular tachycardia as he describes having to receive adenosine for termination. He was seen in the past by Dr. Ubaldo Glassing  who prescribed verapamil. The patient had no recurrent tachycardia on this medication. He has chronic medical conditions that include chronic leg edema, morbid obesity and previous tobacco use.   he had a routine EKG done recently which showed incomplete left bundle branch block. The patient's symptoms include mild exertional dyspnea without orthopnea or PND. He takes torsemide 20 mg twice daily for leg edema. He denies any substernal chest pain or discomfort.   he quit smoking more than 3 years ago. He has no family history of premature coronary artery disease.   Past Medical History  Diagnosis Date  . GERD (gastroesophageal reflux disease)   . COPD (chronic obstructive pulmonary disease) ([place])   . Atrial fibrillation [facility])     Past Surgical History  Procedure Laterality Date  . Wisdom tooth extraction    . Colonoscopy with propofol N/A 08/14/2015    Procedure: COLONOSCOPY WITH PROPOFOL;  Surgeon: Lollie Sails, MD;  Location: [facility] ENDOSCOPY;  Service: Endoscopy;  Laterality: N/A;  . Esophagogastroduodenoscopy (egd) with propofol N/A 08/14/2015    Procedure: ESOPHAGOGASTRODUODENOSCOPY (EGD) WITH PROPOFOL;  Surgeon: Lollie Sails, MD;  Location: [facility] ENDOSCOPY;  Service: Endoscopy;  Laterality: N/A;      Current Outpatient Prescriptions  Medication Sig Dispense Refill  . allopurinol (ZYLOPRIM) 100 MG tablet Take 1 tablet (100 mg total) by mouth 2 (two) times daily. 180 tablet 1  . aspirin 325 MG tablet Take 1 tablet (325 mg total) by mouth daily. Do not restart for 6 days after colonoscopy. 30 tablet 11  . Fluticasone-Salmeterol (ADVAIR DISKUS) 500-50 MCG/DOSE AEPB Inhale into the lungs.    . pantoprazole (PROTONIX) 40 MG tablet Take 1 tablet (40 mg total) by mouth daily. 90 tablet 0  . potassium chloride SA (KLOR-CON M20) 20 MEQ tablet Take 1 tablet (20 mEq total) by mouth daily. 90 tablet 1  . torsemide (DEMADEX) 20 MG tablet Take 1 tablet (20 mg total) by mouth 2 (two) times daily. 60 tablet 0  . verapamil (CALAN-SR) 180 MG CR tablet Take 1 tablet (180 mg total) by mouth 2 (two) times daily. 60 tablet 0   No current facility-administered medications for this visit.    Allergies:   Review of patient's allergies indicates no known allergies.    Social History:  The patient  reports that he quit smoking about 3 years ago. His smoking use included Cigarettes. He quit after 3 years of use. He has never used smokeless tobacco. He reports that he does not drink alcohol or use illicit drugs.   Family History:  The patient's family history includes Cancer in his sister; Cirrhosis in his sister; Diabetes in his sister.    ROS:  Please see the  history of present illness.   Otherwise, review of systems are positive for none.   All other systems are reviewed and negative.    PHYSICAL EXAM: VS:  Ht 5\' 11"  (1.803 m)  Wt 403 lb (182.8 kg)  BMI 56.23 kg/m2  SpO2 96% , BMI Body mass index is 56.23 kg/(m^2). GEN: Well nourished, well developed, in no acute distress HEENT: normal Neck: no JVD, carotid bruits, or masses Cardiac: RRR; no murmurs, rubs, or gallops. There is mild leg edema  Respiratory:  clear to auscultation bilaterally, normal work of breathing GI: soft, nontender,  nondistended, + BS MS: no deformity or atrophy Skin: warm and dry, no rash Neuro:  Strength and sensation are intact Psych: euthymic mood, full affect   EKG:  EKG is ordered today. The ekg ordered today demonstratesSinus rhythm with sinus arrhythmia and borderline prolonged QRS   Recent Labs: 09/23/2015: ALT 24; BUN 10; Creatinine, Ser 0.98; Potassium 4.3; Sodium 140    Lipid Panel    Component Value Date/Time   CHOL 184 09/23/2015 1011   TRIG 138 09/23/2015 1011   HDL 37* 09/23/2015 1011   CHOLHDL 5.0 09/23/2015 1011   LDLCALC 119* 09/23/2015 1011      Wt Readings from Last 3 Encounters:  11/13/15 403 lb (182.8 kg)  09/23/15 401 lb (181.892 kg)  08/14/15 400 lb (181.439 kg)       ASSESSMENT AND PLAN:  1.  Exertional dyspnea: Mildly abnormal EKG with incomplete left bundle branch block. The patient's symptoms could be due to physical deconditioning. I requested an echocardiogram for evaluation. The patient has no symptoms of chest pain and overall suspicion for ischemic heart diseases is low. Thus, I did not request ischemic cardiac evaluation.  2. Paroxysmal supraventricular tachycardia: No recurrent episodes on verapamil.  3. Morbid obesity: Weight loss is recommended.    Disposition:   FU with me as needed.    Signed,  Kathlyn Sacramento, MD  11/13/2015 2:17 PM    [facility]

## 2015-11-16 ENCOUNTER — Telehealth: Payer: Self-pay | Admitting: Family Medicine

## 2015-11-16 NOTE — Telephone Encounter (Signed)
Patient is completely out of Torsemide and Verapamil. One is his heart medication and is requesting to refill it right away. Please send to [place] and call to inform him that it has been completed.

## 2015-11-17 NOTE — Telephone Encounter (Signed)
Medication has been refilled on 11/16/2015 and sent to [facility]

## 2015-11-25 ENCOUNTER — Other Ambulatory Visit: Payer: Self-pay

## 2015-11-25 ENCOUNTER — Ambulatory Visit (INDEPENDENT_AMBULATORY_CARE_PROVIDER_SITE_OTHER): Payer: BC Managed Care – PPO

## 2015-11-25 DIAGNOSIS — R0602 Shortness of breath: Secondary | ICD-10-CM | POA: Diagnosis not present

## 2015-12-15 ENCOUNTER — Other Ambulatory Visit: Payer: Self-pay | Admitting: Family Medicine

## 2015-12-16 ENCOUNTER — Other Ambulatory Visit: Payer: Self-pay | Admitting: Family Medicine

## 2015-12-22 ENCOUNTER — Encounter: Payer: Self-pay | Admitting: Family Medicine

## 2015-12-22 ENCOUNTER — Ambulatory Visit (INDEPENDENT_AMBULATORY_CARE_PROVIDER_SITE_OTHER): Payer: BC Managed Care – PPO | Admitting: Family Medicine

## 2015-12-22 VITALS — BP 126/80 | HR 68 | Temp 97.9°F | Resp 18 | Ht 71.0 in | Wt >= 6400 oz

## 2015-12-22 DIAGNOSIS — M7989 Other specified soft tissue disorders: Secondary | ICD-10-CM | POA: Diagnosis not present

## 2015-12-22 DIAGNOSIS — M1A2720 Drug-induced chronic gout, left ankle and foot, without tophus (tophi): Secondary | ICD-10-CM

## 2015-12-22 DIAGNOSIS — I479 Paroxysmal tachycardia, unspecified: Secondary | ICD-10-CM

## 2015-12-22 MED ORDER — TORSEMIDE 20 MG PO TABS: 20.0000 mg | ORAL_TABLET | Freq: Two times a day (BID) | ORAL | Status: DC

## 2015-12-22 MED ORDER — VERAPAMIL HCL ER 180 MG PO TBCR: 180.0000 mg | EXTENDED_RELEASE_TABLET | Freq: Every day | ORAL | Status: DC

## 2015-12-22 NOTE — Progress Notes (Signed)
Name: Michael Boyle   MRN: [id number]    DOB: [birth year]   Date:12/22/2015       Progress Note  Subjective  Chief Complaint  Chief Complaint  Patient presents with  . Hypertension    3 month follow up    HPI  Chronic Gout: Last Uric Acid level was 7.1 in February 2017, now on Allopurinol 100 mg twice daily, no gout attacks. Will repeat Uric Acid level today.  Tachycardia: Evaluated by Cardiology, normal Echo, continue on Verapamil 180 mg tablet. No episodes of tachycardia.  Leg Swelling: Bilateral lower extremity edema, controlled on Torsemide 20 mg twice daily.  Past Medical History  Diagnosis Date  . GERD (gastroesophageal reflux disease)   . COPD (chronic obstructive pulmonary disease) ([place])   . Atrial fibrillation [facility])     Past Surgical History  Procedure Laterality Date  . Wisdom tooth extraction    . Colonoscopy with propofol N/A 08/14/2015    Procedure: COLONOSCOPY WITH PROPOFOL;  Surgeon: Lollie Sails, MD;  Location: [facility] ENDOSCOPY;  Service: Endoscopy;  Laterality: N/A;  . Esophagogastroduodenoscopy (egd) with propofol N/A 08/14/2015    Procedure: ESOPHAGOGASTRODUODENOSCOPY (EGD) WITH PROPOFOL;  Surgeon: Lollie Sails, MD;  Location: [facility] ENDOSCOPY;  Service: Endoscopy;  Laterality: N/A;    Family History  Problem Relation Age of Onset  . Cancer Sister     lymphoma  . Diabetes Sister   . Cirrhosis Sister     Social History   Social History  . Marital Status: Married    Spouse Name: N/A  . Number of Children: N/A  . Years of Education: N/A   Occupational History  . Not on file.   Social History Main Topics  . Smoking status: Former Smoker -- 3 years    Types: Cigarettes    Quit date: 06/13/2012  . Smokeless tobacco: Never Used  . Alcohol Use: No  . Drug Use: No  . Sexual Activity: Not on file   Other Topics Concern  . Not on file   Social History Narrative     Current outpatient prescriptions:  .  allopurinol (ZYLOPRIM) 100 MG  tablet, Take 1 tablet (100 mg total) by mouth 2 (two) times daily., Disp: 180 tablet, Rfl: 1 .  aspirin 325 MG tablet, Take 1 tablet (325 mg total) by mouth daily. Do not restart for 6 days after colonoscopy., Disp: 30 tablet, Rfl: 11 .  Fluticasone-Salmeterol (ADVAIR DISKUS) 500-50 MCG/DOSE AEPB, Inhale into the lungs., Disp: , Rfl:  .  pantoprazole (PROTONIX) 40 MG tablet, Take 1 tablet (40 mg total) by mouth daily., Disp: 90 tablet, Rfl: 0 .  potassium chloride SA (KLOR-CON M20) 20 MEQ tablet, Take 1 tablet (20 mEq total) by mouth daily., Disp: 90 tablet, Rfl: 1 .  torsemide (DEMADEX) 20 MG tablet, Take 1 tablet (20 mg total) by mouth 2 (two) times daily., Disp: 60 tablet, Rfl: 0 .  verapamil (CALAN-SR) 180 MG CR tablet, Take 1 tablet (180 mg total) by mouth 2 (two) times daily., Disp: 60 tablet, Rfl: 0  No Known Allergies   Review of Systems  Constitutional: Negative for fever, chills and malaise/fatigue.  Respiratory: Negative for cough and shortness of breath.   Cardiovascular: Negative for chest pain, palpitations and leg swelling.  Musculoskeletal: Negative for joint pain.    Objective  Filed Vitals:   12/22/15 0930  BP: 126/80  Pulse: 68  Temp: 97.9 F (36.6 C)  TempSrc: Oral  Resp: 18  Height: 5\' 11"  (1.803  m)  Weight: 405 lb 9.6 oz (183.979 kg)  SpO2: 93%    Physical Exam  Constitutional: He is well-developed, well-nourished, and in no distress.  Cardiovascular: Normal heart sounds.   Pulmonary/Chest: Breath sounds normal.  Musculoskeletal:       Right ankle: He exhibits swelling. Tenderness.       Left ankle: He exhibits swelling. Tenderness.  1+ pitting edema bilateral lower extremities.  Nursing note and vitals reviewed.     Assessment & Plan  1. 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 Tachycardia, paroxysmal (HCC)  - verapamil (CALAN-SR) 180 MG CR tablet; Take 1 tablet (180 mg total) by mouth at bedtime.  Dispense:  90 tablet; Refill: 1  3. Leg swelling  - torsemide (DEMADEX) 20 MG tablet; Take 1 tablet (20 mg total) by mouth 2 (two) times daily.  Dispense: 180 tablet; Refill: 1   Kreg Earhart [facility] 12/22/2015 9:38 AM

## 2015-12-23 LAB — URIC ACID: URIC ACID: 7.4 mg/dL (ref 3.7–8.6)

## 2016-01-11 ENCOUNTER — Telehealth: Payer: Self-pay | Admitting: Family Medicine

## 2016-01-11 ENCOUNTER — Telehealth: Payer: Self-pay

## 2016-01-11 DIAGNOSIS — I479 Paroxysmal tachycardia, unspecified: Secondary | ICD-10-CM

## 2016-01-11 MED ORDER — VERAPAMIL HCL ER 180 MG PO TBCR: 180.0000 mg | EXTENDED_RELEASE_TABLET | Freq: Two times a day (BID) | ORAL | Status: DC

## 2016-01-11 MED ORDER — FLUTICASONE-SALMETEROL 500-50 MCG/DOSE IN AEPB: 1.0000 | INHALATION_SPRAY | Freq: Two times a day (BID) | RESPIRATORY_TRACT | Status: DC

## 2016-01-11 NOTE — Telephone Encounter (Signed)
Patient was prescribed Verapamil. It was sent in as once daily, however it is normally 2x daily. The pharmacy went ahead and gave him the prescription that you had written but it will only hold him for 15 days. Please send corrected prescription to [facility].

## 2016-01-11 NOTE — Telephone Encounter (Signed)
Medication has been refilled and sent to [place] [facility]

## 2016-01-11 NOTE — Telephone Encounter (Signed)
Corrected prescription for verapamil 180 mg 2 times daily is sent to patient's pharmacy

## 2016-01-11 NOTE — Telephone Encounter (Signed)
Patient informed that prescription has been sent to pharmacy 

## 2016-01-15 ENCOUNTER — Other Ambulatory Visit: Payer: Self-pay | Admitting: Family Medicine

## 2016-03-24 ENCOUNTER — Ambulatory Visit (INDEPENDENT_AMBULATORY_CARE_PROVIDER_SITE_OTHER): Payer: BC Managed Care – PPO | Admitting: Family Medicine

## 2016-03-24 ENCOUNTER — Encounter: Payer: Self-pay | Admitting: Family Medicine

## 2016-03-24 VITALS — BP 122/76 | HR 72 | Temp 98.5°F | Resp 16 | Wt 398.7 lb

## 2016-03-24 DIAGNOSIS — J449 Chronic obstructive pulmonary disease, unspecified: Secondary | ICD-10-CM | POA: Diagnosis not present

## 2016-03-24 DIAGNOSIS — T502X5A Adverse effect of carbonic-anhydrase inhibitors, benzothiadiazides and other diuretics, initial encounter: Secondary | ICD-10-CM | POA: Diagnosis not present

## 2016-03-24 DIAGNOSIS — K219 Gastro-esophageal reflux disease without esophagitis: Secondary | ICD-10-CM

## 2016-03-24 DIAGNOSIS — I479 Paroxysmal tachycardia, unspecified: Secondary | ICD-10-CM | POA: Diagnosis not present

## 2016-03-24 DIAGNOSIS — M7989 Other specified soft tissue disorders: Secondary | ICD-10-CM | POA: Diagnosis not present

## 2016-03-24 DIAGNOSIS — M1A2720 Drug-induced chronic gout, left ankle and foot, without tophus (tophi): Secondary | ICD-10-CM

## 2016-03-24 DIAGNOSIS — E876 Hypokalemia: Secondary | ICD-10-CM | POA: Diagnosis not present

## 2016-03-24 MED ORDER — TORSEMIDE 20 MG PO TABS: 20.0000 mg | ORAL_TABLET | Freq: Two times a day (BID) | ORAL | 1 refills | Status: DC

## 2016-03-24 MED ORDER — VERAPAMIL HCL ER 180 MG PO TBCR: 180.0000 mg | EXTENDED_RELEASE_TABLET | Freq: Two times a day (BID) | ORAL | 1 refills | Status: DC

## 2016-03-24 MED ORDER — POTASSIUM CHLORIDE CRYS ER 20 MEQ PO TBCR: 20.0000 meq | EXTENDED_RELEASE_TABLET | Freq: Every day | ORAL | 1 refills | Status: DC

## 2016-03-24 MED ORDER — PANTOPRAZOLE SODIUM 40 MG PO TBEC: 40.0000 mg | DELAYED_RELEASE_TABLET | Freq: Every day | ORAL | 0 refills | Status: DC

## 2016-03-24 MED ORDER — ALLOPURINOL 100 MG PO TABS: 100.0000 mg | ORAL_TABLET | Freq: Two times a day (BID) | ORAL | 1 refills | Status: DC

## 2016-03-24 MED ORDER — FLUTICASONE-SALMETEROL 500-50 MCG/DOSE IN AEPB: 1.0000 | INHALATION_SPRAY | Freq: Two times a day (BID) | RESPIRATORY_TRACT | 0 refills | Status: DC

## 2016-03-24 NOTE — Progress Notes (Signed)
Name: Michael Boyle   MRN: [id number]    DOB: 09-01-1965   Date:03/24/2016       Progress Note  Subjective  Chief Complaint  Chief Complaint  Patient presents with  . Follow-up  . Hypertension    HPI  Supraventricular Tachycardia: Initially diagnosed with SVT many years ago, started on Verapamil 180 mg twice daily. Heart rate stays within normal range for the most part but sometimes has a brief run of 'flutter' which resolves spontaneously.  He has followed with Cardiology, had EKG and Echocardiogram and everything was fine.   Past Medical History:  Diagnosis Date  . Atrial fibrillation ([place])   . COPD (chronic obstructive pulmonary disease) ([place])   . GERD (gastroesophageal reflux disease)     Past Surgical History:  Procedure Laterality Date  . COLONOSCOPY WITH PROPOFOL N/A 08/14/2015   Procedure: COLONOSCOPY WITH PROPOFOL;  Surgeon: Lollie Sails, MD;  Location: [facility] ENDOSCOPY;  Service: Endoscopy;  Laterality: N/A;  . ESOPHAGOGASTRODUODENOSCOPY (EGD) WITH PROPOFOL N/A 08/14/2015   Procedure: ESOPHAGOGASTRODUODENOSCOPY (EGD) WITH PROPOFOL;  Surgeon: Lollie Sails, MD;  Location: [facility] ENDOSCOPY;  Service: Endoscopy;  Laterality: N/A;  . WISDOM TOOTH EXTRACTION      Family History  Problem Relation Age of Onset  . Cancer Sister     lymphoma  . Diabetes Sister   . Cirrhosis Sister     Social History   Social History  . Marital status: Married    Spouse name: N/A  . Number of children: N/A  . Years of education: N/A   Occupational History  . Not on file.   Social History Main Topics  . Smoking status: Former Smoker    Years: 3.00    Types: Cigarettes    Quit date: 06/13/2012  . Smokeless tobacco: Never Used  . Alcohol use No  . Drug use: No  . Sexual activity: Not on file   Other Topics Concern  . Not on file   Social History Narrative  . No narrative on file     Current Outpatient Prescriptions:  .  allopurinol (ZYLOPRIM) 100 MG tablet, Take  1 tablet (100 mg total) by mouth 2 (two) times daily., Disp: 180 tablet, Rfl: 1 .  aspirin 325 MG tablet, Take 1 tablet (325 mg total) by mouth daily. Do not restart for 6 days after colonoscopy., Disp: 30 tablet, Rfl: 11 .  Fluticasone-Salmeterol (ADVAIR DISKUS) 500-50 MCG/DOSE AEPB, Inhale 1 puff into the lungs 2 (two) times daily., Disp: 60 each, Rfl: 0 .  pantoprazole (PROTONIX) 40 MG tablet, Take 1 tablet (40 mg total) by mouth daily., Disp: 90 tablet, Rfl: 0 .  potassium chloride SA (KLOR-CON M20) 20 MEQ tablet, Take 1 tablet (20 mEq total) by mouth daily., Disp: 90 tablet, Rfl: 1 .  torsemide (DEMADEX) 20 MG tablet, Take 1 tablet (20 mg total) by mouth 2 (two) times daily., Disp: 180 tablet, Rfl: 1 .  verapamil (CALAN-SR) 180 MG CR tablet, Take 1 tablet (180 mg total) by mouth 2 (two) times daily., Disp: 180 tablet, Rfl: 1  No Known Allergies   Review of Systems  Constitutional: Negative for chills, fever and malaise/fatigue.  Respiratory: Negative for shortness of breath and wheezing.   Cardiovascular: Positive for palpitations and leg swelling. Negative for chest pain.  Gastrointestinal: Negative for abdominal pain, heartburn, nausea and vomiting.  Musculoskeletal: Negative for joint pain.    Objective  Vitals:   03/24/16 0850  BP: 122/76  Pulse: 72  Resp: 16  Temp: 98.5 F (36.9 C)  TempSrc: Oral  SpO2: 96%  Weight: (!) 398 lb 11.2 oz (180.8 kg)    Physical Exam  Constitutional: He is oriented to person, place, and time and well-developed, well-nourished, and in no distress.  HENT:  Head: Normocephalic and atraumatic.  Cardiovascular: Normal rate, regular rhythm and normal heart sounds.   No murmur heard. Pulmonary/Chest: Effort normal and breath sounds normal. He has no wheezes.  Abdominal: Soft. Bowel sounds are normal. There is no tenderness.  Musculoskeletal: He exhibits edema and tenderness.  Neurological: He is alert and oriented to person, place, and time.   Psychiatric: Mood, memory, affect and judgment normal.  Nursing note and vitals reviewed.   Assessment & Plan  1. Diuretic-induced hypokalemia  - potassium chloride SA (KLOR-CON M20) 20 MEQ tablet; Take 1 tablet (20 mEq total) by mouth daily.  Dispense: 90 tablet; Refill: 1  2. 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  Dispense: 180 tablet; Refill: 1  3. Leg swelling  - torsemide (DEMADEX) 20 MG tablet; Take 1 tablet (20 mg total) by mouth 2 (two) times daily.  Dispense: 180 tablet; Refill: 1  4. Tachycardia, paroxysmal (HCC) Story of SVT, stable on verapamil, encouraged to follow up with cardiology annually. - verapamil (CALAN-SR) 180 MG CR tablet; Take 1 tablet (180 mg total) by mouth 2 (two) times daily.  Dispense: 180 tablet; Refill: 1  5. Chronic obstructive pulmonary disease, unspecified COPD type (HCC)  - Fluticasone-Salmeterol (ADVAIR DISKUS) 500-50 MCG/DOSE AEPB; Inhale 1 puff into the lungs 2 (two) times daily.  Dispense: 60 each; Refill: 0  6. Gastroesophageal reflux disease, esophagitis presence not specified  - pantoprazole (PROTONIX) 40 MG tablet; Take 1 tablet (40 mg total) by mouth daily.  Dispense: 90 tablet; Refill: 0 .  Diedre Maclellan Asad A. [facility] 03/24/2016 8:58 AM

## 2016-05-30 ENCOUNTER — Other Ambulatory Visit: Payer: Self-pay | Admitting: Family Medicine

## 2016-06-29 ENCOUNTER — Other Ambulatory Visit: Payer: Self-pay | Admitting: Family Medicine

## 2016-06-29 DIAGNOSIS — K219 Gastro-esophageal reflux disease without esophagitis: Secondary | ICD-10-CM

## 2016-08-09 ENCOUNTER — Other Ambulatory Visit: Payer: Self-pay | Admitting: Family Medicine

## 2016-09-23 ENCOUNTER — Encounter: Payer: Self-pay | Admitting: Family Medicine

## 2016-09-23 ENCOUNTER — Ambulatory Visit (INDEPENDENT_AMBULATORY_CARE_PROVIDER_SITE_OTHER): Payer: BC Managed Care – PPO | Admitting: Family Medicine

## 2016-09-23 VITALS — BP 130/72 | HR 81 | Temp 98.0°F | Resp 18 | Ht 71.0 in | Wt >= 6400 oz

## 2016-09-23 DIAGNOSIS — I479 Paroxysmal tachycardia, unspecified: Secondary | ICD-10-CM

## 2016-09-23 DIAGNOSIS — M7989 Other specified soft tissue disorders: Secondary | ICD-10-CM | POA: Diagnosis not present

## 2016-09-23 DIAGNOSIS — M1A2720 Drug-induced chronic gout, left ankle and foot, without tophus (tophi): Secondary | ICD-10-CM

## 2016-09-23 DIAGNOSIS — T502X5A Adverse effect of carbonic-anhydrase inhibitors, benzothiadiazides and other diuretics, initial encounter: Secondary | ICD-10-CM | POA: Diagnosis not present

## 2016-09-23 DIAGNOSIS — E876 Hypokalemia: Secondary | ICD-10-CM

## 2016-09-23 DIAGNOSIS — K219 Gastro-esophageal reflux disease without esophagitis: Secondary | ICD-10-CM | POA: Diagnosis not present

## 2016-09-23 DIAGNOSIS — R739 Hyperglycemia, unspecified: Secondary | ICD-10-CM | POA: Diagnosis not present

## 2016-09-23 DIAGNOSIS — E7800 Pure hypercholesterolemia, unspecified: Secondary | ICD-10-CM

## 2016-09-23 DIAGNOSIS — J449 Chronic obstructive pulmonary disease, unspecified: Secondary | ICD-10-CM | POA: Diagnosis not present

## 2016-09-23 LAB — LIPID PANEL
Cholesterol: 158 mg/dL (ref <200)
HDL: 36 mg/dL — AB (ref >40)
LDL Cholesterol: 98 mg/dL (ref <100)
TRIGLYCERIDES: 118 mg/dL (ref <150)
Total CHOL/HDL Ratio: 4.4 Ratio (ref <5.0)
VLDL: 24 mg/dL (ref <30)

## 2016-09-23 LAB — COMPLETE METABOLIC PANEL WITH GFR
ALT: 30 U/L (ref 9–46)
AST: 25 U/L (ref 10–35)
Albumin: 4 g/dL (ref 3.6–5.1)
Alkaline Phosphatase: 79 U/L (ref 40–115)
BILIRUBIN TOTAL: 0.5 mg/dL (ref 0.2–1.2)
BUN: 9 mg/dL (ref 7–25)
CALCIUM: 9.2 mg/dL (ref 8.6–10.3)
CO2: 29 mmol/L (ref 20–31)
CREATININE: 0.97 mg/dL (ref 0.70–1.33)
Chloride: 99 mmol/L (ref 98–110)
GFR, Est African American: >89 mL/min (ref >=60)
GFR, Est Non African American: >89 mL/min (ref >=60)
Glucose, Bld: 118 mg/dL — ABNORMAL HIGH (ref 65–99)
Potassium: 4.5 mmol/L (ref 3.5–5.3)
Sodium: 140 mmol/L (ref 135–146)
TOTAL PROTEIN: 7.4 g/dL (ref 6.1–8.1)

## 2016-09-23 LAB — POCT GLYCOSYLATED HEMOGLOBIN (HGB A1C): Hemoglobin A1C: 7.5

## 2016-09-23 LAB — URIC ACID: URIC ACID, SERUM: 6.6 mg/dL (ref 4.0–8.0)

## 2016-09-23 MED ORDER — TORSEMIDE 20 MG PO TABS: 20.0000 mg | ORAL_TABLET | Freq: Two times a day (BID) | ORAL | 1 refills | Status: DC

## 2016-09-23 MED ORDER — ALLOPURINOL 100 MG PO TABS: 100.0000 mg | ORAL_TABLET | Freq: Two times a day (BID) | ORAL | 1 refills | Status: DC

## 2016-09-23 MED ORDER — VERAPAMIL HCL ER 180 MG PO TBCR: 180.0000 mg | EXTENDED_RELEASE_TABLET | Freq: Two times a day (BID) | ORAL | 1 refills | Status: DC

## 2016-09-23 MED ORDER — FLUTICASONE-SALMETEROL 500-50 MCG/DOSE IN AEPB: 1.0000 | INHALATION_SPRAY | Freq: Two times a day (BID) | RESPIRATORY_TRACT | 0 refills | Status: DC

## 2016-09-23 MED ORDER — PANTOPRAZOLE SODIUM 40 MG PO TBEC: 40.0000 mg | DELAYED_RELEASE_TABLET | Freq: Every day | ORAL | 0 refills | Status: DC

## 2016-09-23 MED ORDER — POTASSIUM CHLORIDE CRYS ER 20 MEQ PO TBCR: 20.0000 meq | EXTENDED_RELEASE_TABLET | Freq: Every day | ORAL | 1 refills | Status: DC

## 2016-09-23 NOTE — Progress Notes (Signed)
Name: Michael Boyle   MRN: [id number]    DOB: 03/08/1966   Date:09/23/2016       Progress Note  Subjective  Chief Complaint  Chief Complaint  Patient presents with  . Follow-up    6 month recheck  . Medication Refill    HPI  COPD: Pt. Presents for follow up on COPD, gets short of breath with strenuous activity, has a rescue inhaler for emergency use. Otherwise takes Advair inhaler every day. No dyspnea at rest, no cough, increased sputum production or fevers/chills.  Paroxysmal Tachycardia: Pt. Has paroxysmal tachycardia, has been followed by a cardiologist, last echo was within normal limits, had tow episodes of tachycardia in the last month each lasting for less than 10 seconds.   GERD: Pt. Has gastroesophageal reflux takes Pantoprazole 40 mg daily, no symptoms of heartburn, cough, sore throat, abdominal pain, nausea or vomiting. Had an endoscopy last year was normal.  Gout: Pt. has chronic gout, takes Allopurinol 100 mg twice daily, no recent gout attack. Last Uric acid level was 7.4 mg/dL.   Past Medical History:  Diagnosis Date  . Atrial fibrillation ([place])   . COPD (chronic obstructive pulmonary disease) ([place])   . GERD (gastroesophageal reflux disease)     Past Surgical History:  Procedure Laterality Date  . COLONOSCOPY WITH PROPOFOL N/A 08/14/2015   Procedure: COLONOSCOPY WITH PROPOFOL;  Surgeon: Lollie Sails, MD;  Location: [facility] ENDOSCOPY;  Service: Endoscopy;  Laterality: N/A;  . ESOPHAGOGASTRODUODENOSCOPY (EGD) WITH PROPOFOL N/A 08/14/2015   Procedure: ESOPHAGOGASTRODUODENOSCOPY (EGD) WITH PROPOFOL;  Surgeon: Lollie Sails, MD;  Location: [facility] ENDOSCOPY;  Service: Endoscopy;  Laterality: N/A;  . WISDOM TOOTH EXTRACTION      Family History  Problem Relation Age of Onset  . Cancer Sister     lymphoma  . Diabetes Sister   . Cirrhosis Sister     Social History   Social History  . Marital status: Married    Spouse name: N/A  . Number of children: N/A   . Years of education: N/A   Occupational History  . Not on file.   Social History Main Topics  . Smoking status: Former Smoker    Years: 3.00    Types: Cigarettes    Quit date: 06/13/2012  . Smokeless tobacco: Never Used  . Alcohol use No  . Drug use: No  . Sexual activity: Not on file   Other Topics Concern  . Not on file   Social History Narrative  . No narrative on file     Current Outpatient Prescriptions:  .  allopurinol (ZYLOPRIM) 100 MG tablet, Take 1 tablet (100 mg total) by mouth 2 (two) times daily., Disp: 180 tablet, Rfl: 1 .  aspirin 325 MG tablet, Take 1 tablet (325 mg total) by mouth daily. Do not restart for 6 days after colonoscopy., Disp: 30 tablet, Rfl: 11 .  Fluticasone-Salmeterol (ADVAIR DISKUS) 500-50 MCG/DOSE AEPB, Inhale 1 puff into the lungs 2 (two) times daily., Disp: 60 each, Rfl: 0 .  potassium chloride SA (KLOR-CON M20) 20 MEQ tablet, Take 1 tablet (20 mEq total) by mouth daily., Disp: 90 tablet, Rfl: 1 .  torsemide (DEMADEX) 20 MG tablet, Take 1 tablet (20 mg total) by mouth 2 (two) times daily., Disp: 180 tablet, Rfl: 1 .  verapamil (CALAN-SR) 180 MG CR tablet, Take 1 tablet (180 mg total) by mouth 2 (two) times daily., Disp: 180 tablet, Rfl: 1 .  pantoprazole (PROTONIX) 40 MG tablet, Take 1 tablet (40  mg total) by mouth daily. (Patient not taking: Reported on 09/23/2016), Disp: 90 tablet, Rfl: 0  No Known Allergies   ROS  Please see history of present illness for complete discussion of ROS  Objective  Vitals:   09/23/16 0822  BP: 130/72  Pulse: 81  Resp: 18  Temp: 98 F (36.7 C)  TempSrc: Oral  SpO2: 94%  Weight: (!) 415 lb 12.8 oz (188.6 kg)  Height: 5\' 11"  (1.803 m)    Physical Exam  Constitutional: He is oriented to person, place, and time and well-developed, well-nourished, and in no distress.  HENT:  Head: Normocephalic and atraumatic.  Cardiovascular: Normal rate, regular rhythm and normal heart sounds.   No murmur  heard. Pulmonary/Chest: Effort normal and breath sounds normal. He has no wheezes.  Musculoskeletal:       Right ankle: He exhibits swelling. Tenderness.       Left ankle: He exhibits swelling. Tenderness.  Neurological: He is alert and oriented to person, place, and time.  Psychiatric: Mood, memory, affect and judgment normal.  Nursing note and vitals reviewed.     Assessment & Plan  1. Chronic obstructive pulmonary disease, unspecified COPD type ([place]) Stable, taking maintenance therapy every day, has not needed rescue inhaler as much. Refills provided - Fluticasone-Salmeterol (ADVAIR DISKUS) 500-50 MCG/DOSE AEPB; Inhale 1 puff into the lungs 2 (two) times daily.  Dispense: 60 each; Refill: 0  2. Gastroesophageal reflux disease, esophagitis presence not specified Symptoms responsive to PPI - pantoprazole (PROTONIX) 40 MG tablet; Take 1 tablet (40 mg total) by mouth daily.  Dispense: 90 tablet; Refill: 0  3. 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 - allopurinol (ZYLOPRIM) 100 MG tablet; Take 1 tablet (100 mg total) by mouth 2 (two) times daily.  Dispense: 180 tablet; Refill: 1 - Uric acid - COMPLETE METABOLIC PANEL WITH GFR  4. Diuretic-induced hypokalemia  - potassium chloride SA (KLOR-CON M20) 20 MEQ tablet; Take 1 tablet (20 mEq total) by mouth daily.  Dispense: 90 tablet; Refill: 1  5. Leg swelling Stable on diuretic therapy - torsemide (DEMADEX) 20 MG tablet; Take 1 tablet (20 mg total) by mouth 2 (two) times daily.  Dispense: 180 tablet; Refill: 1  6. Tachycardia, paroxysmal (HCC) Infrequent episodes, being followed by cardiology. Continue on verapamil as prescribed - verapamil (CALAN-SR) 180 MG CR tablet; Take 1 tablet (180 mg total) by mouth 2 (two) times daily.  Dispense: 180 tablet; Refill: 1  7. Hypercholesterolemia  - Lipid panel  8. Hyperglycemia A1c  7.5%, consistent with diabetes, we will schedule an office visit to discuss and start treatment. - POCT glycosylated hemoglobin (Hb A1C)   Symone Cornman Asad A. [facility] 09/23/2016 8:28 AM

## 2016-10-03 ENCOUNTER — Encounter: Payer: Self-pay | Admitting: Family Medicine

## 2016-10-03 ENCOUNTER — Ambulatory Visit (INDEPENDENT_AMBULATORY_CARE_PROVIDER_SITE_OTHER): Payer: BC Managed Care – PPO | Admitting: Family Medicine

## 2016-10-03 DIAGNOSIS — IMO0001 Reserved for inherently not codable concepts without codable children: Secondary | ICD-10-CM

## 2016-10-03 DIAGNOSIS — E1165 Type 2 diabetes mellitus with hyperglycemia: Secondary | ICD-10-CM | POA: Diagnosis not present

## 2016-10-03 DIAGNOSIS — E785 Hyperlipidemia, unspecified: Secondary | ICD-10-CM | POA: Insufficient documentation

## 2016-10-03 MED ORDER — METFORMIN HCL 500 MG PO TABS: 500.0000 mg | ORAL_TABLET | Freq: Every day | ORAL | 0 refills | Status: DC

## 2016-10-03 NOTE — Progress Notes (Signed)
Name: Michael Boyle   MRN: [id number]    DOB: 1966/03/09   Date:10/03/2016       Progress Note  Subjective  Chief Complaint  Chief Complaint  Patient presents with  . Follow-up    Discuss medication for elevated A1C    Diabetes  He presents for his initial diabetic visit. He has type 2 diabetes mellitus. Disease course: new diagnosis. There are no hypoglycemic associated symptoms. Pertinent negatives for hypoglycemia include no tremors. Pertinent negatives for diabetes include no chest pain, no fatigue, no foot paresthesias, no polydipsia, no polyphagia, no polyuria and no visual change. Pertinent negatives for diabetic complications include no CVA, heart disease, impotence, peripheral neuropathy or retinopathy. When asked about current treatments, none were reported. His weight is decreasing steadily. He is following a generally unhealthy diet. Frequency home blood tests: does not check blood glucose.     Past Medical History:  Diagnosis Date  . Atrial fibrillation ([place])   . COPD (chronic obstructive pulmonary disease) ([place])   . GERD (gastroesophageal reflux disease)     Past Surgical History:  Procedure Laterality Date  . COLONOSCOPY WITH PROPOFOL N/A 08/14/2015   Procedure: COLONOSCOPY WITH PROPOFOL;  Surgeon: Lollie Sails, MD;  Location: [facility] ENDOSCOPY;  Service: Endoscopy;  Laterality: N/A;  . ESOPHAGOGASTRODUODENOSCOPY (EGD) WITH PROPOFOL N/A 08/14/2015   Procedure: ESOPHAGOGASTRODUODENOSCOPY (EGD) WITH PROPOFOL;  Surgeon: Lollie Sails, MD;  Location: [facility] ENDOSCOPY;  Service: Endoscopy;  Laterality: N/A;  . WISDOM TOOTH EXTRACTION      Family History  Problem Relation Age of Onset  . Cancer Sister     lymphoma  . Diabetes Sister   . Cirrhosis Sister     Social History   Social History  . Marital status: Married    Spouse name: N/A  . Number of children: N/A  . Years of education: N/A   Occupational History  . Not on file.   Social History Main Topics   . Smoking status: Former Smoker    Years: 3.00    Types: Cigarettes    Quit date: 06/13/2012  . Smokeless tobacco: Never Used  . Alcohol use No  . Drug use: No  . Sexual activity: Not on file   Other Topics Concern  . Not on file   Social History Narrative  . No narrative on file     Current Outpatient Prescriptions:  .  allopurinol (ZYLOPRIM) 100 MG tablet, Take 1 tablet (100 mg total) by mouth 2 (two) times daily., Disp: 180 tablet, Rfl: 1 .  aspirin 325 MG tablet, Take 1 tablet (325 mg total) by mouth daily. Do not restart for 6 days after colonoscopy., Disp: 30 tablet, Rfl: 11 .  Fluticasone-Salmeterol (ADVAIR DISKUS) 500-50 MCG/DOSE AEPB, Inhale 1 puff into the lungs 2 (two) times daily., Disp: 60 each, Rfl: 0 .  pantoprazole (PROTONIX) 40 MG tablet, Take 1 tablet (40 mg total) by mouth daily., Disp: 90 tablet, Rfl: 0 .  potassium chloride SA (KLOR-CON M20) 20 MEQ tablet, Take 1 tablet (20 mEq total) by mouth daily., Disp: 90 tablet, Rfl: 1 .  torsemide (DEMADEX) 20 MG tablet, Take 1 tablet (20 mg total) by mouth 2 (two) times daily., Disp: 180 tablet, Rfl: 1 .  verapamil (CALAN-SR) 180 MG CR tablet, Take 1 tablet (180 mg total) by mouth 2 (two) times daily., Disp: 180 tablet, Rfl: 1  No Known Allergies   Review of Systems  Constitutional: Negative for fatigue.  Cardiovascular: Negative for chest pain.  Genitourinary: Negative for impotence.  Neurological: Negative for tremors.  Endo/Heme/Allergies: Negative for polydipsia and polyphagia.    Objective  Vitals:   10/03/16 1017  BP: 132/73  Pulse: 68  Resp: 16  Temp: 97.6 F (36.4 C)  TempSrc: Oral  SpO2: 98%  Weight: (!) 403 lb 8 oz (183 kg)  Height: 5\' 11"  (1.803 m)    Physical Exam  Constitutional: He is oriented to person, place, and time and well-developed, well-nourished, and in no distress.  HENT:  Head: Normocephalic and atraumatic.  Cardiovascular: Normal rate, regular rhythm and normal heart  sounds.   No murmur heard. Pulmonary/Chest: Effort normal and breath sounds normal. He has no wheezes.  Abdominal: Soft. Bowel sounds are normal. There is no tenderness.  Neurological: He is alert and oriented to person, place, and time.  Psychiatric: Mood, memory, affect and judgment normal.  Nursing note and vitals reviewed.        Assessment & Plan  1. Uncontrolled type 2 diabetes mellitus without complication, without long-term current use of insulin ([place]) Newly dx T2DM with likely insulin resistance. Start on Metformin 500 mg daily, advised to start physical activity to lose weight, dietary changes to decrease sugar consumption, follow up in 3 months. - metFORMIN (GLUCOPHAGE) 500 MG tablet; Take 1 tablet (500 mg total) by mouth daily after supper.  Dispense: 90 tablet; Refill: 0   Jesyka Slaght Asad A. [facility] 10/03/2016 10:46 AM

## 2016-11-28 ENCOUNTER — Other Ambulatory Visit: Payer: Self-pay | Admitting: Family Medicine

## 2016-11-28 DIAGNOSIS — J449 Chronic obstructive pulmonary disease, unspecified: Secondary | ICD-10-CM

## 2017-01-03 ENCOUNTER — Ambulatory Visit (INDEPENDENT_AMBULATORY_CARE_PROVIDER_SITE_OTHER): Payer: BC Managed Care – PPO | Admitting: Family Medicine

## 2017-01-03 ENCOUNTER — Encounter: Payer: Self-pay | Admitting: Family Medicine

## 2017-01-03 VITALS — BP 134/73 | HR 69 | Temp 98.1°F | Resp 16 | Ht 71.0 in | Wt 395.9 lb

## 2017-01-03 DIAGNOSIS — E119 Type 2 diabetes mellitus without complications: Secondary | ICD-10-CM

## 2017-01-03 DIAGNOSIS — T502X5A Adverse effect of carbonic-anhydrase inhibitors, benzothiadiazides and other diuretics, initial encounter: Secondary | ICD-10-CM

## 2017-01-03 DIAGNOSIS — E876 Hypokalemia: Secondary | ICD-10-CM | POA: Diagnosis not present

## 2017-01-03 DIAGNOSIS — I479 Paroxysmal tachycardia, unspecified: Secondary | ICD-10-CM | POA: Diagnosis not present

## 2017-01-03 DIAGNOSIS — K219 Gastro-esophageal reflux disease without esophagitis: Secondary | ICD-10-CM

## 2017-01-03 DIAGNOSIS — M7989 Other specified soft tissue disorders: Secondary | ICD-10-CM

## 2017-01-03 DIAGNOSIS — J449 Chronic obstructive pulmonary disease, unspecified: Secondary | ICD-10-CM

## 2017-01-03 DIAGNOSIS — M1A2720 Drug-induced chronic gout, left ankle and foot, without tophus (tophi): Secondary | ICD-10-CM

## 2017-01-03 LAB — GLUCOSE, POCT (MANUAL RESULT ENTRY): POC Glucose: 122 mg/dl — AB (ref 70–99)

## 2017-01-03 LAB — POCT GLYCOSYLATED HEMOGLOBIN (HGB A1C): HEMOGLOBIN A1C: 6.6

## 2017-01-03 MED ORDER — FLUTICASONE-SALMETEROL 500-50 MCG/DOSE IN AEPB: 1.0000 | INHALATION_SPRAY | Freq: Two times a day (BID) | RESPIRATORY_TRACT | 0 refills | Status: DC

## 2017-01-03 MED ORDER — POTASSIUM CHLORIDE CRYS ER 20 MEQ PO TBCR: 20.0000 meq | EXTENDED_RELEASE_TABLET | Freq: Every day | ORAL | 1 refills | Status: DC

## 2017-01-03 MED ORDER — TORSEMIDE 20 MG PO TABS: 20.0000 mg | ORAL_TABLET | Freq: Two times a day (BID) | ORAL | 1 refills | Status: DC

## 2017-01-03 MED ORDER — VERAPAMIL HCL ER 180 MG PO TBCR: 180.0000 mg | EXTENDED_RELEASE_TABLET | Freq: Two times a day (BID) | ORAL | 1 refills | Status: DC

## 2017-01-03 MED ORDER — METFORMIN HCL 500 MG PO TABS: 500.0000 mg | ORAL_TABLET | Freq: Every day | ORAL | 0 refills | Status: DC

## 2017-01-03 MED ORDER — PANTOPRAZOLE SODIUM 40 MG PO TBEC: 40.0000 mg | DELAYED_RELEASE_TABLET | Freq: Every day | ORAL | 0 refills | Status: DC

## 2017-01-03 MED ORDER — ALLOPURINOL 100 MG PO TABS: 100.0000 mg | ORAL_TABLET | Freq: Two times a day (BID) | ORAL | 1 refills | Status: DC

## 2017-01-03 NOTE — Progress Notes (Signed)
Name: Michael Boyle   MRN: [id number]    DOB: [birth year]   Date:01/03/2017       Progress Note  Subjective  Chief Complaint  Chief Complaint  Patient presents with  . Follow-up    3 mo  . Medication Refill    Diabetes  He presents for his follow-up diabetic visit. He has type 2 diabetes mellitus. His disease course has been stable. There are no hypoglycemic associated symptoms. Pertinent negatives for diabetes include no fatigue, no foot paresthesias, no polydipsia and no polyuria. Current diabetic treatment includes oral agent (monotherapy) and diet. He is following a diabetic and generally healthy (cut down on soft drinks etc) diet. He monitors blood glucose at home 1-2 x per day. His breakfast blood glucose range is generally 130-140 mg/dl. An ACE inhibitor/angiotensin II receptor blocker is not being taken. Eye exam is not current.   COPD: Pt. presents for follow up on COPD, gets somewhat short of breath with strenuous activity, has a rescue inhaler for emergency use (hasn't used it in years). Otherwise takes Advair inhaler every day. No dyspnea at rest, no cough, increased sputum production or fevers/chills.  Paroxysmal Tachycardia: Pt. has paroxysmal tachycardia, has been followed by a cardiologist, last echo was within normal limits, had no episodes of tachycardia in the last month   GERD: Pt. Has gastroesophageal reflux takes Pantoprazole 40 mg daily, no symptoms of heartburn, cough, sore throat, abdominal pain, nausea or vomiting. Had an endoscopy last year was normal.  Gout: Pt. has chronic gout, takes Allopurinol 100 mg twice daily, no recent gout attack. Last Uric acid level was 7.4 mg/dL.   Past Medical History:  Diagnosis Date  . Atrial fibrillation ([place])   . COPD (chronic obstructive pulmonary disease) ([place])   . GERD (gastroesophageal reflux disease)     Past Surgical History:  Procedure Laterality Date  . COLONOSCOPY WITH PROPOFOL N/A 08/14/2015   Procedure:  COLONOSCOPY WITH PROPOFOL;  Surgeon: Lollie Sails, MD;  Location: [facility] ENDOSCOPY;  Service: Endoscopy;  Laterality: N/A;  . ESOPHAGOGASTRODUODENOSCOPY (EGD) WITH PROPOFOL N/A 08/14/2015   Procedure: ESOPHAGOGASTRODUODENOSCOPY (EGD) WITH PROPOFOL;  Surgeon: Lollie Sails, MD;  Location: [facility] ENDOSCOPY;  Service: Endoscopy;  Laterality: N/A;  . WISDOM TOOTH EXTRACTION      Family History  Problem Relation Age of Onset  . Cancer Sister        lymphoma  . Diabetes Sister   . Cirrhosis Sister     Social History   Social History  . Marital status: Married    Spouse name: N/A  . Number of children: N/A  . Years of education: N/A   Occupational History  . Not on file.   Social History Main Topics  . Smoking status: Former Smoker    Years: 3.00    Types: Cigarettes    Quit date: 06/13/2012  . Smokeless tobacco: Never Used  . Alcohol use No  . Drug use: No  . Sexual activity: Not on file   Other Topics Concern  . Not on file   Social History Narrative  . No narrative on file     Current Outpatient Prescriptions:  .  ADVAIR DISKUS 500-50 MCG/DOSE AEPB, Inhale 1 puff into the lungs 2 (two) times daily., Disp: 60 each, Rfl: 0 .  allopurinol (ZYLOPRIM) 100 MG tablet, Take 1 tablet (100 mg total) by mouth 2 (two) times daily., Disp: 180 tablet, Rfl: 1 .  aspirin 325 MG tablet, Take 1 tablet (325 mg total)  by mouth daily. Do not restart for 6 days after colonoscopy., Disp: 30 tablet, Rfl: 11 .  metFORMIN (GLUCOPHAGE) 500 MG tablet, Take 1 tablet (500 mg total) by mouth daily after supper., Disp: 90 tablet, Rfl: 0 .  pantoprazole (PROTONIX) 40 MG tablet, Take 1 tablet (40 mg total) by mouth daily., Disp: 90 tablet, Rfl: 0 .  potassium chloride SA (KLOR-CON M20) 20 MEQ tablet, Take 1 tablet (20 mEq total) by mouth daily., Disp: 90 tablet, Rfl: 1 .  torsemide (DEMADEX) 20 MG tablet, Take 1 tablet (20 mg total) by mouth 2 (two) times daily., Disp: 180 tablet, Rfl: 1 .  verapamil  (CALAN-SR) 180 MG CR tablet, Take 1 tablet (180 mg total) by mouth 2 (two) times daily., Disp: 180 tablet, Rfl: 1  No Known Allergies   Review of Systems  Constitutional: Negative for fatigue.  Endo/Heme/Allergies: Negative for polydipsia.     Objective  Vitals:   01/03/17 0825  BP: 134/73  Pulse: 69  Resp: 16  Temp: 98.1 F (36.7 C)  TempSrc: Oral  SpO2: 96%  Weight: (!) 395 lb 14.4 oz (179.6 kg)  Height: 5\' 11"  (1.803 m)    Physical Exam  Constitutional: He is oriented to person, place, and time and well-developed, well-nourished, and in no distress.  HENT:  Head: Normocephalic and atraumatic.  Cardiovascular: Normal rate, regular rhythm and normal heart sounds.   No murmur heard. Pulmonary/Chest: Effort normal and breath sounds normal. He has no wheezes.  Musculoskeletal:       Right ankle: He exhibits swelling. Tenderness.       Left ankle: He exhibits swelling. Tenderness.  Neurological: He is alert and oriented to person, place, and time.  Psychiatric: Mood, memory, affect and judgment normal.  Nursing note and vitals reviewed.   Recent Results (from the past 2160 hour(s))  POCT Glucose (CBG)     Status: Abnormal   Collection Time: 01/03/17  8:27 AM  Result Value Ref Range   POC Glucose 122 (A) 70 - 99 mg/dl  POCT HgB A1C     Status: Abnormal   Collection Time: 01/03/17  8:29 AM  Result Value Ref Range   Hemoglobin A1C 6.6      Assessment & Plan  1. Type 2 diabetes mellitus without complication, without long-term current use of insulin (HCC) Point-of-care A1c 6.6, well-controlled diabetes, encouraged to continue with dietary and lifestyle changes % - POCT HgB A1C - POCT Glucose (CBG) - metFORMIN (GLUCOPHAGE) 500 MG tablet; Take 1 tablet (500 mg total) by mouth daily after supper.  Dispense: 90 tablet; Refill: 0 - Urine Microalbumin w/creat. ratio  2. Chronic obstructive pulmonary disease, unspecified COPD type (HCC)  - Fluticasone-Salmeterol  (ADVAIR DISKUS) 500-50 MCG/DOSE AEPB; Inhale 1 puff into the lungs 2 (two) times daily.  Dispense: 60 each; Refill: 0  3. Diuretic-induced hypokalemia  - potassium chloride SA (KLOR-CON M20) 20 MEQ tablet; Take 1 tablet (20 mEq total) by mouth daily.  Dispense: 90 tablet; Refill: 1  4. 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  Dispense: 180 tablet; Refill: 1  5. Gastroesophageal reflux disease, esophagitis presence not specified On PPI - pantoprazole (PROTONIX) 40 MG tablet; Take 1 tablet (40 mg total) by mouth daily.  Dispense: 90 tablet; Refill: 0  6. Leg swelling Stable, continue on Demadex 20 mg twice a day, refills provided - torsemide (DEMADEX) 20 MG  tablet; Take 1 tablet (20 mg total) by mouth 2 (two) times daily.  Dispense: 180 tablet; Refill: 1  7. Tachycardia, paroxysmal (HCC)  - verapamil (CALAN-SR) 180 MG CR tablet; Take 1 tablet (180 mg total) by mouth 2 (two) times daily.  Dispense: 180 tablet; Refill: 1   Dayne Dekay Asad A. [facility] 01/03/2017 8:35 AM

## 2017-01-04 LAB — MICROALBUMIN / CREATININE URINE RATIO
CREATININE, URINE: 67 mg/dL (ref 20–370)
MICROALB UR: 0.3 mg/dL
Microalb Creat Ratio: 4 mcg/mg creat (ref <30)

## 2017-03-24 ENCOUNTER — Ambulatory Visit (INDEPENDENT_AMBULATORY_CARE_PROVIDER_SITE_OTHER): Payer: BC Managed Care – PPO | Admitting: Family Medicine

## 2017-03-24 ENCOUNTER — Encounter: Payer: Self-pay | Admitting: Family Medicine

## 2017-03-24 DIAGNOSIS — Z0000 Encounter for general adult medical examination without abnormal findings: Secondary | ICD-10-CM | POA: Insufficient documentation

## 2017-03-24 LAB — CBC WITH DIFFERENTIAL/PLATELET
BASOS ABS: 85 {cells}/uL (ref 0–200)
Basophils Relative: 1 %
EOS PCT: 2 %
Eosinophils Absolute: 170 cells/uL (ref 15–500)
HEMATOCRIT: 45.6 % (ref 38.5–50.0)
Hemoglobin: 14.7 g/dL (ref 13.2–17.1)
LYMPHS ABS: 2125 {cells}/uL (ref 850–3900)
LYMPHS PCT: 25 %
MCH: 26.7 pg — AB (ref 27.0–33.0)
MCHC: 32.2 g/dL (ref 32.0–36.0)
MCV: 82.9 fL (ref 80.0–100.0)
MPV: 9.3 fL (ref 7.5–12.5)
Monocytes Absolute: 680 cells/uL (ref 200–950)
Monocytes Relative: 8 %
NEUTROS PCT: 64 %
Neutro Abs: 5440 cells/uL (ref 1500–7800)
Platelets: 312 10*3/uL (ref 140–400)
RBC: 5.5 MIL/uL (ref 4.20–5.80)
RDW: 14.6 % (ref 11.0–15.0)
WBC: 8.5 10*3/uL (ref 3.8–10.8)

## 2017-03-24 LAB — TSH: TSH: 1.47 m[IU]/L (ref 0.40–4.50)

## 2017-03-24 NOTE — Progress Notes (Signed)
Name: Michael Boyle   MRN: [id number]    DOB: 1965-11-06   Date:03/24/2017       Progress Note  Subjective  Chief Complaint  Chief Complaint  Patient presents with  . Annual Exam    CPE    HPI  Patient presents for complete physical.  His colonoscopy was January 2017, multiple polyps were found, returns for colonoscopy shortly. He is due for prostate cancer screening.    Past Medical History:  Diagnosis Date  . Atrial fibrillation ([place])   . COPD (chronic obstructive pulmonary disease) ([place])   . GERD (gastroesophageal reflux disease)     Past Surgical History:  Procedure Laterality Date  . COLONOSCOPY WITH PROPOFOL N/A 08/14/2015   Procedure: COLONOSCOPY WITH PROPOFOL;  Surgeon: Lollie Sails, MD;  Location: [facility] ENDOSCOPY;  Service: Endoscopy;  Laterality: N/A;  . ESOPHAGOGASTRODUODENOSCOPY (EGD) WITH PROPOFOL N/A 08/14/2015   Procedure: ESOPHAGOGASTRODUODENOSCOPY (EGD) WITH PROPOFOL;  Surgeon: Lollie Sails, MD;  Location: [facility] ENDOSCOPY;  Service: Endoscopy;  Laterality: N/A;  . WISDOM TOOTH EXTRACTION      Family History  Problem Relation Age of Onset  . Cancer Sister        lymphoma  . Diabetes Sister   . Cirrhosis Sister     Social History   Social History  . Marital status: Married    Spouse name: N/A  . Number of children: N/A  . Years of education: N/A   Occupational History  . Not on file.   Social History Main Topics  . Smoking status: Former Smoker    Years: 3.00    Types: Cigarettes    Quit date: 06/13/2012  . Smokeless tobacco: Never Used  . Alcohol use No  . Drug use: No  . Sexual activity: Not on file   Other Topics Concern  . Not on file   Social History Narrative  . No narrative on file     Current Outpatient Prescriptions:  .  allopurinol (ZYLOPRIM) 100 MG tablet, Take 1 tablet (100 mg total) by mouth 2 (two) times daily., Disp: 180 tablet, Rfl: 1 .  aspirin 325 MG tablet, Take 1 tablet (325 mg total) by mouth daily. Do  not restart for 6 days after colonoscopy., Disp: 30 tablet, Rfl: 11 .  Fluticasone-Salmeterol (ADVAIR DISKUS) 500-50 MCG/DOSE AEPB, Inhale 1 puff into the lungs 2 (two) times daily., Disp: 60 each, Rfl: 0 .  metFORMIN (GLUCOPHAGE) 500 MG tablet, Take 1 tablet (500 mg total) by mouth daily after supper., Disp: 90 tablet, Rfl: 0 .  pantoprazole (PROTONIX) 40 MG tablet, Take 1 tablet (40 mg total) by mouth daily., Disp: 90 tablet, Rfl: 0 .  potassium chloride SA (KLOR-CON M20) 20 MEQ tablet, Take 1 tablet (20 mEq total) by mouth daily., Disp: 90 tablet, Rfl: 1 .  torsemide (DEMADEX) 20 MG tablet, Take 1 tablet (20 mg total) by mouth 2 (two) times daily., Disp: 180 tablet, Rfl: 1 .  verapamil (CALAN-SR) 180 MG CR tablet, Take 1 tablet (180 mg total) by mouth 2 (two) times daily., Disp: 180 tablet, Rfl: 1  No Known Allergies   Review of Systems  Constitutional: Negative for chills, fever and malaise/fatigue.  HENT: Negative for congestion and sore throat.   Eyes: Negative for blurred vision and double vision.  Respiratory: Negative for cough and shortness of breath.   Cardiovascular: Negative for chest pain and leg swelling.  Gastrointestinal: Negative for diarrhea, nausea and vomiting.  Genitourinary: Negative for dysuria and hematuria.  Musculoskeletal: Negative for back pain (occasional low back pain) and neck pain.  Neurological: Negative for dizziness and headaches.  Psychiatric/Behavioral: Negative for depression. The patient is not nervous/anxious.       Objective  Vitals:   03/24/17 0906  BP: 132/75  Pulse: 66  Resp: 16  Temp: (!) 97.5 F (36.4 C)  TempSrc: Oral  SpO2: 94%  Weight: (!) 394 lb 14.4 oz (179.1 kg)  Height: 5\' 11"  (1.803 m)    Physical Exam  Constitutional: He is oriented to person, place, and time and well-developed, well-nourished, and in no distress.  HENT:  Head: Normocephalic and atraumatic.  Right Ear: External ear normal.  Left Ear: External ear  normal.  Mouth/Throat: Oropharynx is clear and moist.  Eyes: Pupils are equal, round, and reactive to light. Conjunctivae are normal.  Neck: Neck supple. No thyromegaly present.  Cardiovascular: Normal rate, regular rhythm and normal heart sounds.   No murmur heard. Pulmonary/Chest: Effort normal and breath sounds normal. He has no wheezes.  Abdominal: Soft. Bowel sounds are normal. He exhibits no distension.  Musculoskeletal: He exhibits edema. He exhibits no tenderness.  Neurological: He is alert and oriented to person, place, and time.  Psychiatric: Mood, memory, affect and judgment normal.  Nursing note and vitals reviewed.     Assessment & Plan  1. Annual physical exam Obtain age appropriate lab work - CBC with Differential/Platelet - TSH - VITAMIN D 25 Hydroxy (Vit-D Deficiency, Fractures) - PSA   Kameko Hukill Asad A. [facility] 03/24/2017 9:14 AM

## 2017-03-25 LAB — PSA: PSA: 0.5 ng/mL (ref <=4.0)

## 2017-03-25 LAB — VITAMIN D 25 HYDROXY (VIT D DEFICIENCY, FRACTURES): VIT D 25 HYDROXY: 25 ng/mL — AB (ref 30–100)

## 2017-04-03 ENCOUNTER — Other Ambulatory Visit: Payer: Self-pay | Admitting: Family Medicine

## 2017-04-03 DIAGNOSIS — E119 Type 2 diabetes mellitus without complications: Secondary | ICD-10-CM

## 2017-04-03 DIAGNOSIS — K219 Gastro-esophageal reflux disease without esophagitis: Secondary | ICD-10-CM

## 2017-04-11 ENCOUNTER — Telehealth: Payer: Self-pay

## 2017-04-11 MED ORDER — VITAMIN D (ERGOCALCIFEROL) 1.25 MG (50000 UNIT) PO CAPS: 50000.0000 [IU] | ORAL_CAPSULE | ORAL | 0 refills | Status: DC

## 2017-04-11 NOTE — Telephone Encounter (Signed)
Patient has been notified of lab results and a prescription for vitamin D3 50,000 units take 1 capsule once a week x12 weeks has been sent to [facility] per Dr. Manuella Ghazi, patient verbalized udnerstanding

## 2017-04-24 ENCOUNTER — Ambulatory Visit (INDEPENDENT_AMBULATORY_CARE_PROVIDER_SITE_OTHER): Payer: BC Managed Care – PPO | Admitting: Family Medicine

## 2017-04-24 ENCOUNTER — Encounter: Payer: Self-pay | Admitting: Family Medicine

## 2017-04-24 VITALS — BP 136/78 | HR 71 | Temp 97.8°F | Resp 16 | Ht 71.0 in | Wt >= 6400 oz

## 2017-04-24 DIAGNOSIS — E7800 Pure hypercholesterolemia, unspecified: Secondary | ICD-10-CM | POA: Diagnosis not present

## 2017-04-24 DIAGNOSIS — E118 Type 2 diabetes mellitus with unspecified complications: Secondary | ICD-10-CM

## 2017-04-24 DIAGNOSIS — K219 Gastro-esophageal reflux disease without esophagitis: Secondary | ICD-10-CM | POA: Diagnosis not present

## 2017-04-24 LAB — GLUCOSE, POCT (MANUAL RESULT ENTRY): POC GLUCOSE: 121 mg/dL — AB (ref 70–99)

## 2017-04-24 LAB — POCT GLYCOSYLATED HEMOGLOBIN (HGB A1C): Hemoglobin A1C: 6.7

## 2017-04-24 NOTE — Progress Notes (Signed)
Name: Michael Boyle   MRN: [id number]    DOB: 1965/11/01   Date:04/24/2017       Progress Note  Subjective  Chief Complaint  Chief Complaint  Patient presents with  . Follow-up    1 mo  . Diabetes  . Gastroesophageal Reflux    Diabetes  He presents for his follow-up diabetic visit. He has type 2 diabetes mellitus. His disease course has been improving. There are no hypoglycemic associated symptoms. Pertinent negatives for diabetes include no fatigue, no foot paresthesias, no polydipsia and no polyuria. Current diabetic treatment includes oral agent (monotherapy). He is following a generally healthy diet. He monitors blood glucose at home 3-4 x per week. His breakfast blood glucose range is generally 110-130 mg/dl. An ACE inhibitor/angiotensin II receptor blocker is not being taken.  Gastroesophageal Reflux  He reports no abdominal pain, no belching or no heartburn. This is a chronic problem. Pertinent negatives include no fatigue. He has tried a PPI for the symptoms. Past procedures do not include esophageal manometry.     Past Medical History:  Diagnosis Date  . Atrial fibrillation ([place])   . COPD (chronic obstructive pulmonary disease) ([place])   . GERD (gastroesophageal reflux disease)     Past Surgical History:  Procedure Laterality Date  . COLONOSCOPY WITH PROPOFOL N/A 08/14/2015   Procedure: COLONOSCOPY WITH PROPOFOL;  Surgeon: Lollie Sails, MD;  Location: [facility] ENDOSCOPY;  Service: Endoscopy;  Laterality: N/A;  . ESOPHAGOGASTRODUODENOSCOPY (EGD) WITH PROPOFOL N/A 08/14/2015   Procedure: ESOPHAGOGASTRODUODENOSCOPY (EGD) WITH PROPOFOL;  Surgeon: Lollie Sails, MD;  Location: [facility] ENDOSCOPY;  Service: Endoscopy;  Laterality: N/A;  . WISDOM TOOTH EXTRACTION      Family History  Problem Relation Age of Onset  . Cancer Sister        lymphoma  . Diabetes Sister   . Cirrhosis Sister     Social History   Social History  . Marital status: Married    Spouse name: N/A  .  Number of children: N/A  . Years of education: N/A   Occupational History  . Not on file.   Social History Main Topics  . Smoking status: Former Smoker    Years: 3.00    Types: Cigarettes    Quit date: 06/13/2012  . Smokeless tobacco: Never Used  . Alcohol use No  . Drug use: No  . Sexual activity: Not on file   Other Topics Concern  . Not on file   Social History Narrative  . No narrative on file     Current Outpatient Prescriptions:  .  allopurinol (ZYLOPRIM) 100 MG tablet, Take 1 tablet (100 mg total) by mouth 2 (two) times daily., Disp: 180 tablet, Rfl: 1 .  aspirin 325 MG tablet, Take 1 tablet (325 mg total) by mouth daily. Do not restart for 6 days after colonoscopy., Disp: 30 tablet, Rfl: 11 .  Fluticasone-Salmeterol (ADVAIR DISKUS) 500-50 MCG/DOSE AEPB, Inhale 1 puff into the lungs 2 (two) times daily., Disp: 60 each, Rfl: 0 .  metFORMIN (GLUCOPHAGE) 500 MG tablet, Take 1 tablet (500 mg total) by mouth daily after supper., Disp: 90 tablet, Rfl: 0 .  pantoprazole (PROTONIX) 40 MG tablet, Take 1 tablet (40 mg total) by mouth daily., Disp: 90 tablet, Rfl: 0 .  potassium chloride SA (KLOR-CON M20) 20 MEQ tablet, Take 1 tablet (20 mEq total) by mouth daily., Disp: 90 tablet, Rfl: 1 .  torsemide (DEMADEX) 20 MG tablet, Take 1 tablet (20 mg total) by mouth  2 (two) times daily., Disp: 180 tablet, Rfl: 1 .  verapamil (CALAN-SR) 180 MG CR tablet, Take 1 tablet (180 mg total) by mouth 2 (two) times daily., Disp: 180 tablet, Rfl: 1 .  Vitamin D, Ergocalciferol, (DRISDOL) 50000 units CAPS capsule, Take 1 capsule (50,000 Units total) by mouth once a week. For 12 weeks, Disp: 12 capsule, Rfl: 0  No Known Allergies   Review of Systems  Constitutional: Negative for fatigue.  Gastrointestinal: Negative for abdominal pain and heartburn.  Endo/Heme/Allergies: Negative for polydipsia.     Objective  Vitals:   04/24/17 0842  BP: 136/78  Pulse: 71  Resp: 16  Temp: 97.8 F (36.6  C)  TempSrc: Oral  SpO2: 96%  Weight: (!) 401 lb (181.9 kg)  Height: 5\' 11"  (1.803 m)    Physical Exam  Constitutional: He is oriented to person, place, and time and well-developed, well-nourished, and in no distress.  Cardiovascular: Normal rate, regular rhythm and normal heart sounds.   No murmur heard. Pulmonary/Chest: Effort normal and breath sounds normal. He has no wheezes.  Abdominal: Soft. Bowel sounds are normal. There is no tenderness.  Musculoskeletal: He exhibits edema.  Neurological: He is alert and oriented to person, place, and time.  Psychiatric: Mood, memory, affect and judgment normal.  Nursing note and vitals reviewed.       Assessment & Plan  1. Controlled type 2 diabetes mellitus with complication, without long-term current use of insulin (HCC) Point-of-care A1c 6.7%, well-controlled diabetes - POCT HgB A1C - POCT Glucose (CBG)  2. Hypercholesterolemia Obtain FLP, consider starting on statin - Lipid panel - COMPLETE METABOLIC PANEL WITH GFR  3. Gastroesophageal reflux disease, esophagitis presence not specified Symptoms stable on PPI   Jazia Faraci Asad A. [facility] 04/24/2017 8:55 AM

## 2017-04-25 LAB — COMPLETE METABOLIC PANEL WITH GFR
AG Ratio: 1.1 (calc) (ref 1.0–2.5)
ALKALINE PHOSPHATASE (APISO): 64 U/L (ref 40–115)
ALT: 26 U/L (ref 9–46)
AST: 20 U/L (ref 10–35)
Albumin: 4.2 g/dL (ref 3.6–5.1)
BUN: 10 mg/dL (ref 7–25)
CO2: 31 mmol/L (ref 20–32)
CREATININE: 0.93 mg/dL (ref 0.70–1.33)
Calcium: 9.2 mg/dL (ref 8.6–10.3)
Chloride: 100 mmol/L (ref 98–110)
GFR, EST AFRICAN AMERICAN: 109 mL/min/{1.73_m2} (ref >=60)
GFR, Est Non African American: 94 mL/min/{1.73_m2} (ref >=60)
GLUCOSE: 124 mg/dL — AB (ref 65–99)
Globulin: 3.7 g/dL (calc) (ref 1.9–3.7)
Potassium: 4.4 mmol/L (ref 3.5–5.3)
SODIUM: 140 mmol/L (ref 135–146)
Total Bilirubin: 0.4 mg/dL (ref 0.2–1.2)
Total Protein: 7.9 g/dL (ref 6.1–8.1)

## 2017-04-25 LAB — LIPID PANEL
CHOL/HDL RATIO: 4.3 (calc) (ref <5.0)
Cholesterol: 172 mg/dL (ref <200)
HDL: 40 mg/dL — ABNORMAL LOW (ref >40)
LDL CHOLESTEROL (CALC): 108 mg/dL — AB
Non-HDL Cholesterol (Calc): 132 mg/dL (calc) — ABNORMAL HIGH (ref <130)
Triglycerides: 126 mg/dL (ref <150)

## 2017-06-15 ENCOUNTER — Other Ambulatory Visit: Payer: Self-pay | Admitting: Family Medicine

## 2017-06-15 DIAGNOSIS — K219 Gastro-esophageal reflux disease without esophagitis: Secondary | ICD-10-CM

## 2017-06-15 DIAGNOSIS — J449 Chronic obstructive pulmonary disease, unspecified: Secondary | ICD-10-CM

## 2017-06-15 NOTE — Telephone Encounter (Signed)
Dr. Manuella Ghazi prescribed a 90 day supply of the PPI on 04/03/17, so he shouldn't be out of this yet I approved the Advair

## 2017-06-21 ENCOUNTER — Other Ambulatory Visit: Payer: Self-pay | Admitting: Family Medicine

## 2017-06-21 DIAGNOSIS — K219 Gastro-esophageal reflux disease without esophagitis: Secondary | ICD-10-CM

## 2017-07-07 ENCOUNTER — Other Ambulatory Visit: Payer: Self-pay | Admitting: Family Medicine

## 2017-07-07 DIAGNOSIS — E119 Type 2 diabetes mellitus without complications: Secondary | ICD-10-CM

## 2017-07-22 ENCOUNTER — Encounter: Payer: Self-pay | Admitting: Family Medicine

## 2017-07-22 ENCOUNTER — Ambulatory Visit: Payer: BC Managed Care – PPO | Admitting: Family Medicine

## 2017-07-22 VITALS — BP 136/78 | Temp 97.7°F | Resp 16 | Wt >= 6400 oz

## 2017-07-22 DIAGNOSIS — Z23 Encounter for immunization: Secondary | ICD-10-CM | POA: Diagnosis not present

## 2017-07-22 DIAGNOSIS — K219 Gastro-esophageal reflux disease without esophagitis: Secondary | ICD-10-CM

## 2017-07-22 DIAGNOSIS — M1A2720 Drug-induced chronic gout, left ankle and foot, without tophus (tophi): Secondary | ICD-10-CM | POA: Diagnosis not present

## 2017-07-22 DIAGNOSIS — E876 Hypokalemia: Secondary | ICD-10-CM | POA: Diagnosis not present

## 2017-07-22 DIAGNOSIS — I479 Paroxysmal tachycardia, unspecified: Secondary | ICD-10-CM

## 2017-07-22 DIAGNOSIS — T502X5A Adverse effect of carbonic-anhydrase inhibitors, benzothiadiazides and other diuretics, initial encounter: Secondary | ICD-10-CM

## 2017-07-22 DIAGNOSIS — M7989 Other specified soft tissue disorders: Secondary | ICD-10-CM

## 2017-07-22 MED ORDER — ALLOPURINOL 100 MG PO TABS: 100.0000 mg | ORAL_TABLET | Freq: Two times a day (BID) | ORAL | 1 refills | Status: DC

## 2017-07-22 MED ORDER — PANTOPRAZOLE SODIUM 40 MG PO TBEC: 40.0000 mg | DELAYED_RELEASE_TABLET | Freq: Every day | ORAL | 0 refills | Status: DC

## 2017-07-22 MED ORDER — POTASSIUM CHLORIDE CRYS ER 20 MEQ PO TBCR: 20.0000 meq | EXTENDED_RELEASE_TABLET | Freq: Every day | ORAL | 1 refills | Status: DC

## 2017-07-22 MED ORDER — VERAPAMIL HCL ER 180 MG PO TBCR: 180.0000 mg | EXTENDED_RELEASE_TABLET | Freq: Two times a day (BID) | ORAL | 1 refills | Status: DC

## 2017-07-22 MED ORDER — TORSEMIDE 20 MG PO TABS: 20.0000 mg | ORAL_TABLET | Freq: Two times a day (BID) | ORAL | 1 refills | Status: DC

## 2017-07-22 NOTE — Progress Notes (Signed)
Name: Michael Boyle   MRN: [id number]    DOB: Dec 14, 1965   Date:07/22/2017       Progress Note  Subjective  Chief Complaint  Chief Complaint  Patient presents with  . Diabetes    f/u  . Gout    f/u  . Gastroesophageal Reflux    f/u    Diabetes  He presents for his follow-up diabetic visit. He has type 2 diabetes mellitus. His disease course has been stable. There are no hypoglycemic associated symptoms. Pertinent negatives for diabetes include no fatigue, no foot paresthesias, no polydipsia and no polyuria. Risk factors for coronary artery disease include dyslipidemia. Current diabetic treatment includes oral agent (monotherapy). He is following a diabetic diet. His breakfast blood glucose range is generally 110-130 mg/dl. An ACE inhibitor/angiotensin II receptor blocker is not being taken.  Gastroesophageal Reflux  He reports no belching, no coughing, no heartburn, no nausea or no sore throat. This is a chronic problem. The problem occurs constantly. The symptoms are aggravated by certain foods. Pertinent negatives include no fatigue. Risk factors include obesity. Past procedures do not include an EGD or esophageal manometry.   He has chronic paroxysmal tachycardia, takes Verapamil 180 mg daily, no recurrent episodes, take medication as directed  He has chronic gout, has not had any attack in a long time, sometimes gets sore in toes or ankles but it does not last, last Uric Acid level was 6.6 mg/dL,  He takes Allopurinol 200 mg at bedtime. Reports medication working well.   Past Medical History:  Diagnosis Date  . Atrial fibrillation ([place])   . COPD (chronic obstructive pulmonary disease) ([place])   . GERD (gastroesophageal reflux disease)     Past Surgical History:  Procedure Laterality Date  . COLONOSCOPY WITH PROPOFOL N/A 08/14/2015   Procedure: COLONOSCOPY WITH PROPOFOL;  Surgeon: Lollie Sails, MD;  Location: [facility] ENDOSCOPY;  Service: Endoscopy;  Laterality: N/A;  .  ESOPHAGOGASTRODUODENOSCOPY (EGD) WITH PROPOFOL N/A 08/14/2015   Procedure: ESOPHAGOGASTRODUODENOSCOPY (EGD) WITH PROPOFOL;  Surgeon: Lollie Sails, MD;  Location[facility] ENDOSCOPY;  Service: Endoscopy;  Laterality: N/A;  . WISDOM TOOTH EXTRACTION      Family History  Problem Relation Age of Onset  . Cancer Sister        lymphoma  . Diabetes Sister   . Cirrhosis Sister     Social History   Socioeconomic History  . Marital status: Married    Spouse name: Not on file  . Number of children: Not on file  . Years of education: Not on file  . Highest education level: Not on file  Social Needs  . Financial resource strain: Not on file  . Food insecurity - worry: Not on file  . Food insecurity - inability: Not on file  . Transportation needs - medical: Not on file  . Transportation needs - non-medical: Not on file  Occupational History  . Not on file  Tobacco Use  . Smoking status: Former Smoker    Years: 3.00    Types: Cigarettes    Last attempt to quit: 06/13/2012    Years since quitting: 5.1  . Smokeless tobacco: Never Used  Substance and Sexual Activity  . Alcohol use: No    Alcohol/week: 0.0 oz  . Drug use: No  . Sexual activity: Not on file  Other Topics Concern  . Not on file  Social History Narrative  . Not on file     Current Outpatient Medications:  .  ADVAIR DISKUS  500-50 MCG/DOSE AEPB, Inhale 1 puff into the lungs 2 (two) times daily., Disp: 60 each, Rfl: 0 .  allopurinol (ZYLOPRIM) 100 MG tablet, Take 1 tablet (100 mg total) by mouth 2 (two) times daily., Disp: 180 tablet, Rfl: 1 .  aspirin 325 MG tablet, Take 1 tablet (325 mg total) by mouth daily. Do not restart for 6 days after colonoscopy., Disp: 30 tablet, Rfl: 11 .  metFORMIN (GLUCOPHAGE) 500 MG tablet, Take 1 tablet (500 mg total) by mouth daily after supper., Disp: 90 tablet, Rfl: 0 .  pantoprazole (PROTONIX) 40 MG tablet, Take 1 tablet (40 mg total) by mouth daily., Disp: 90 tablet, Rfl: 0 .   torsemide (DEMADEX) 20 MG tablet, Take 1 tablet (20 mg total) by mouth 2 (two) times daily., Disp: 180 tablet, Rfl: 1 .  verapamil (CALAN-SR) 180 MG CR tablet, Take 1 tablet (180 mg total) by mouth 2 (two) times daily., Disp: 180 tablet, Rfl: 1 .  pantoprazole (PROTONIX) 40 MG tablet, Take 1 tablet (40 mg total) by mouth daily. (Patient not taking: Reported on 07/22/2017), Disp: 90 tablet, Rfl: 0 .  potassium chloride SA (KLOR-CON M20) 20 MEQ tablet, Take 1 tablet (20 mEq total) by mouth daily., Disp: 90 tablet, Rfl: 1 .  Vitamin D, Ergocalciferol, (DRISDOL) 50000 units CAPS capsule, Take 1 capsule (50,000 Units total) by mouth once a week. For 12 weeks (Patient not taking: Reported on 07/22/2017), Disp: 12 capsule, Rfl: 0  No Known Allergies   Review of Systems  Constitutional: Negative for fatigue.  HENT: Negative for sore throat.   Respiratory: Negative for cough.   Gastrointestinal: Negative for heartburn and nausea.  Endo/Heme/Allergies: Negative for polydipsia.    Objective  Vitals:   07/22/17 0951  BP: 136/78  Resp: 16  Temp: 97.7 F (36.5 C)  TempSrc: Oral  SpO2: 96%  Weight: (!) 409 lb 6.4 oz (185.7 kg)    Physical Exam  Constitutional: He is oriented to person, place, and time and well-developed, well-nourished, and in no distress.  HENT:  Head: Normocephalic and atraumatic.  Cardiovascular: Normal rate, regular rhythm and normal heart sounds.  No murmur heard. Pulmonary/Chest: Effort normal and breath sounds normal. He has no wheezes.  Musculoskeletal: He exhibits no edema.       Right ankle: He exhibits swelling.       Left ankle: He exhibits no swelling.  Neurological: He is alert and oriented to person, place, and time.  Psychiatric: Mood, memory, affect and judgment normal.  Nursing note and vitals reviewed.      Recent Results (from the past 2160 hour(s))  POCT Glucose (CBG)     Status: Abnormal   Collection Time: 04/24/17  8:47 AM  Result Value Ref  Range   POC Glucose 121 (A) 70 - 99 mg/dl  POCT HgB A1C     Status: Abnormal   Collection Time: 04/24/17  8:51 AM  Result Value Ref Range   Hemoglobin A1C 6.7   Lipid panel     Status: Abnormal   Collection Time: 04/24/17  2:15 PM  Result Value Ref Range   Cholesterol 172 <200 mg/dL   HDL 40 (L) >40 mg/dL   Triglycerides 126 <150 mg/dL   LDL Cholesterol (Calc) 108 (H) mg/dL (calc)    Comment: Reference range: <100 . Desirable range <100 mg/dL for primary prevention;   <70 mg/dL for patients with CHD or diabetic patients  with > or = 2 CHD risk factors. Marland Kitchen LDL-C is now calculated  using the Martin-Hopkins  calculation, which is a validated novel method providing  better accuracy than the Friedewald equation in the  estimation of LDL-C.  Cresenciano Genre et al. Annamaria Helling. 8242;353(61): 2061-2068  (http://education.[facility].com/faq/FAQ164)    Total CHOL/HDL Ratio 4.3 <5.0 (calc)   Non-HDL Cholesterol (Calc) 132 (H) <130 mg/dL (calc)    Comment: For patients with diabetes plus 1 major ASCVD risk  factor, treating to a non-HDL-C goal of <100 mg/dL  (LDL-C of <70 mg/dL) is considered a therapeutic  option.   COMPLETE METABOLIC PANEL WITH GFR     Status: Abnormal   Collection Time: 04/24/17  2:15 PM  Result Value Ref Range   Glucose, Bld 124 (H) 65 - 99 mg/dL    Comment: .            Fasting reference interval . For someone without known diabetes, a glucose value between 100 and 125 mg/dL is consistent with prediabetes and should be confirmed with a follow-up test. .    BUN 10 7 - 25 mg/dL   Creat 0.93 0.70 - 1.33 mg/dL    Comment: For patients >42 years of age, the reference limit for Creatinine is approximately 13% higher for people identified as African-American. .    GFR, Est Non African American 94 > OR = 60 mL/min/1.71m2   GFR, Est African American 109 > OR = 60 mL/min/1.12m2   BUN/Creatinine Ratio NOT APPLICABLE 6 - 22 (calc)   Sodium 140 135 - 146 mmol/L    Potassium 4.4 3.5 - 5.3 mmol/L   Chloride 100 98 - 110 mmol/L   CO2 31 20 - 32 mmol/L   Calcium 9.2 8.6 - 10.3 mg/dL   Total Protein 7.9 6.1 - 8.1 g/dL   Albumin 4.2 3.6 - 5.1 g/dL   Globulin 3.7 1.9 - 3.7 g/dL (calc)   AG Ratio 1.1 1.0 - 2.5 (calc)   Total Bilirubin 0.4 0.2 - 1.2 mg/dL   Alkaline phosphatase (APISO) 64 40 - 115 U/L   AST 20 10 - 35 U/L   ALT 26 9 - 46 U/L     Assessment & Plan  1. Needs flu shot  - Flu Vaccine QUAD 6+ mos PF IM (Fluarix Quad PF)  2. Diuretic-induced hypokalemia  - potassium chloride SA (KLOR-CON M20) 20 MEQ tablet; Take 1 tablet (20 mEq total) by mouth daily.  Dispense: 90 tablet; Refill: 1  3. 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  Dispense: 180 tablet; Refill: 1  4. Gastroesophageal reflux disease, esophagitis presence not specified - pantoprazole (PROTONIX) 40 MG tablet; Take 1 tablet (40 mg total) by mouth daily.  Dispense: 90 tablet; Refill: 0  5. Leg swelling  - torsemide (DEMADEX) 20 MG tablet; Take 1 tablet (20 mg total) by mouth 2 (two) times daily.  Dispense: 180 tablet; Refill: 1  6. Tachycardia, paroxysmal (HCC)  - verapamil (CALAN-SR) 180 MG CR tablet; Take 1 tablet (180 mg total) by mouth 2 (two) times daily.  Dispense: 180 tablet; Refill: 1   Conan Mcmanaway Asad A. [facility] 07/22/2017 9:58 AM

## 2017-07-24 ENCOUNTER — Ambulatory Visit: Payer: BC Managed Care – PPO | Admitting: Family Medicine

## 2017-08-22 ENCOUNTER — Other Ambulatory Visit: Payer: Self-pay | Admitting: Family Medicine

## 2017-08-22 DIAGNOSIS — J449 Chronic obstructive pulmonary disease, unspecified: Secondary | ICD-10-CM

## 2017-10-02 ENCOUNTER — Encounter: Payer: Self-pay | Admitting: Physician Assistant

## 2017-10-02 ENCOUNTER — Telehealth: Payer: Self-pay | Admitting: Cardiovascular Disease

## 2017-10-02 NOTE — Telephone Encounter (Signed)
Notified Rhonda at [facility] that patient was last seen April 2017. He had an echo and was to follow up PRN. Unable to provide clearance without an appointment. [facility] will reschedule colonoscopy.  I spoke with patient who is agreeable to Thursday, February 28, 1:30pm office visit with Christell Faith, PA-C. Added to schedule.

## 2017-10-02 NOTE — Telephone Encounter (Signed)
° °  [facility] Pre-operative Risk Assessment    Request for surgical clearance:  1. What type of surgery is being performed? Colonoscopy   2. When is this surgery scheduled? 10/05/17  3. What type of clearance is required (medical clearance vs. Pharmacy clearance to hold med vs. Both)? Not listed   4. Are there any medications that need to be held prior to surgery and how long? Not listed   5. Practice name and name of physician performing surgery? [facility]  6. What is your office phone and fax number? Phone [phone] Fax [phone]  7. Anesthesia type (None, local, MAC, general) ? Monitored   Placed in nurses bin

## 2017-10-02 NOTE — Progress Notes (Signed)
Cardiology Office Note Date:  10/05/2017  Patient ID:  Michael Boyle, Michael Boyle 04/06/1966, MRN [id number] PCP:  Roselee Nova, MD (Inactive)  Cardiologist:  Dr. Fletcher Anon, MD    Chief Complaint: Pre-procedure cardiac evaluation  History of Present Illness: Michael Boyle is a 52 y.o. male with history of paroxysmal tachycardia, incomplete LBBB, DM, chronic venous insufficiency, morbid obesity, COPD from prior tobacco abuse for 30 years at 2 packs daily quitting 06/2012, GERD, and gout who presents for pre-procedural cardiac evaluation for colonoscopy.   Patient was previously followed by Dr. Ubaldo Glassing, though transitioned to Dr. Fletcher Anon, last being seen on 11/13/2015 for evaluation of abnormal EKG. 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 He was managed on verapamil while being followed by Dr. Ubaldo Glassing without recurrent symptoms. Prior EKG in 2017 was done at his PCP's office which showed an incomplete LBBB. He denied any chest pain or discomfort at that time. He did note mild exertional dyspnea and was taking torsemide 20 mg daily for lower extremity edema. He underwent echo in 11/2015 that showed an EF of 60-65%, unable to assess wall motion, normal LV diastolic function parameters, mildly calcifief mitral annulus. Ischemic evaluation was not felt to be indicated given the patient had no symptoms of chest pain and the overall suspicion for ischemic heart disease was low. He was continued on verapamil.   Labs 04/2017: A1c 6.7, LDL 108, SCr 0.97, K+ 4.5, LFT normal  He comes in doing well today.  No exertional chest pain.  He does note exertional dyspnea when he is walking upstairs or up an incline.  He is a symptomatically when walking on flat ground.  This has been stable for many years.  He denies any symptoms of palpitations.  He reports verapamil works quite well for him.  He describes a long history of tachypalpitations that  would require administration of adenosine.  No formal cardiac monitoring available for review.  He remains on full dose aspirin 325 mg daily.  He is scheduled for repeat diagnostic colonoscopy due to prior study showing polyps.  He is able to achieve greater than 4 METs.  He does not have any concerns at this time.   Past Medical History:  Diagnosis Date  . COPD (chronic obstructive pulmonary disease) ([place])   . Diabetes mellitus with complication ([place])   . GERD (gastroesophageal reflux disease)   . Gout   . Incomplete left bundle branch block (LBBB)    a. TTE 4/17: EF of 60-65%, unable to assess wall motion, normal LV diastolic function parameters, mildly calcifief mitral annulus  . Morbid obesity ([place])   . Palpitations    a. felt to be pSVT  . Venous insufficiency     Past Surgical History:  Procedure Laterality Date  . COLONOSCOPY WITH PROPOFOL N/A 08/14/2015   Procedure: COLONOSCOPY WITH PROPOFOL;  Surgeon: Lollie Sails, MD;  Location: [facility] ENDOSCOPY;  Service: Endoscopy;  Laterality: N/A;  . ESOPHAGOGASTRODUODENOSCOPY (EGD) WITH PROPOFOL N/A 08/14/2015   Procedure: ESOPHAGOGASTRODUODENOSCOPY (EGD) WITH PROPOFOL;  Surgeon: Lollie Sails, MD;  Location: [facility] ENDOSCOPY;  Service: Endoscopy;  Laterality: N/A;  . WISDOM TOOTH EXTRACTION      Current Meds  Medication Sig  . ADVAIR DISKUS 500-50 MCG/DOSE AEPB Inhale 1 puff into the lungs 2 (two) times daily.  Marland Kitchen allopurinol (ZYLOPRIM) 100 MG tablet Take 1 tablet (100 mg total) by mouth 2 (two) times daily.  Marland Kitchen  metFORMIN (GLUCOPHAGE) 500 MG tablet Take 1 tablet (500 mg total) by mouth daily after supper.  . pantoprazole (PROTONIX) 40 MG tablet Take 1 tablet (40 mg total) by mouth daily.  . potassium chloride SA (KLOR-CON M20) 20 MEQ tablet Take 1 tablet (20 mEq total) by mouth daily.  Marland Kitchen torsemide (DEMADEX) 20 MG tablet Take 1 tablet (20 mg total) by mouth 2 (two) times daily.  . verapamil (CALAN-SR) 180 MG CR tablet Take 1 tablet  (180 mg total) by mouth 2 (two) times daily.  . Vitamin D, Ergocalciferol, (DRISDOL) 50000 units CAPS capsule Take 1 capsule (50,000 Units total) by mouth once a week. For 12 weeks  . [DISCONTINUED] aspirin 325 MG tablet Take 1 tablet (325 mg total) by mouth daily. Do not restart for 6 days after colonoscopy.    Allergies:   Patient has no known allergies.   Social History:  The patient  reports that he quit smoking about 5 years ago. His smoking use included cigarettes. He quit after 3.00 years of use. he has never used smokeless tobacco. He reports that he does not drink alcohol or use drugs.   Family History:  The patient's family history includes Cancer in his sister; Cirrhosis in his sister; Diabetes in his sister.  ROS:   Review of Systems  Constitutional: Positive for malaise/fatigue. Negative for chills, diaphoresis, fever and weight loss.  HENT: Negative for congestion.   Eyes: Negative for discharge and redness.  Respiratory: Positive for shortness of breath. Negative for cough, hemoptysis, sputum production and wheezing.   Cardiovascular: Positive for leg swelling. Negative for chest pain, palpitations, orthopnea, claudication and PND.  Gastrointestinal: Negative for abdominal pain, blood in stool, heartburn, melena, nausea and vomiting.  Genitourinary: Negative for hematuria.  Musculoskeletal: Negative for falls and myalgias.  Skin: Negative for rash.  Neurological: Negative for dizziness, tingling, tremors, sensory change, speech change, focal weakness, loss of consciousness and weakness.  Endo/Heme/Allergies: Does not bruise/bleed easily.  Psychiatric/Behavioral: Negative for substance abuse. The patient is not nervous/anxious.   All other systems reviewed and are negative.    PHYSICAL EXAM:  VS:  BP 130/74 (BP Location: Left Arm, Patient Position: Sitting, Cuff Size: Large)   Pulse 66   Ht 5\' 11"  (1.803 m)   Wt (!) 413 lb 8 oz (187.6 kg)   BMI 57.67 kg/m  BMI: Body  mass index is 57.67 kg/m.  Physical Exam  Constitutional: He is oriented to person, place, and time. He appears well-developed and well-nourished.  HENT:  Head: Normocephalic and atraumatic.  Eyes: Right eye exhibits no discharge. Left eye exhibits no discharge.  Neck: Normal range of motion. No JVD present.  Cardiovascular: Normal rate, regular rhythm, S1 normal, S2 normal and normal heart sounds. Exam reveals no distant heart sounds, no friction rub, no midsystolic click and no opening snap.  No murmur heard. Pulmonary/Chest: Effort normal and breath sounds normal. No respiratory distress. He has no decreased breath sounds. He has no wheezes. He has no rales. He exhibits no tenderness.  Abdominal: Soft. He exhibits no distension. There is no tenderness.  Musculoskeletal: He exhibits edema.  Trace bilateral pretibial edema  Neurological: He is alert and oriented to person, place, and time.  Skin: Skin is warm and dry. No cyanosis. Nails show no clubbing.  Psychiatric: He has a normal mood and affect. His speech is normal and behavior is normal. Judgment and thought content normal.     EKG:  Was ordered and interpreted by me  today. Shows NSR, 66 bpm, nonspecific lateral st/t changes  Recent Labs: 03/24/2017: Hemoglobin 14.7; Platelets 312; TSH 1.47 04/24/2017: ALT 26; BUN 10; Creat 0.93; Potassium 4.4; Sodium 140  04/24/2017: Cholesterol 172; HDL 40; Total CHOL/HDL Ratio 4.3; Triglycerides 126   CrCl cannot be calculated (Patient's most recent lab result is older than the maximum 21 days allowed.).   Wt Readings from Last 3 Encounters:  10/05/17 (!) 413 lb 8 oz (187.6 kg)  07/22/17 (!) 409 lb 6.4 oz (185.7 kg)  04/24/17 (!) 401 lb (181.9 kg)     Other studies reviewed: Additional studies/records reviewed today include: summarized above  ASSESSMENT AND PLAN:  1. Pre-procedure cardiac exam: Patient is scheduled for diagnostic colonoscopy on 11/02/2017.  Given his shortness of  breath and lower extremity swelling we will schedule him for preprocedure echocardiogram to evaluate LV systolic function.  If LVEF is found to be preserved he would be low to moderate risk for low risk colonoscopy.  Defer preprocedure medical evaluation to his primary care physician.  2. Presumed paroxysmal SVT: No formal cardiac monitoring available for review.  He does describe episodes of symptomatic tachypalpitations that would require adenosine push with resolution of symptoms.  This is consistent with paroxysmal SVT.  He has been maintained on verapamil 180 mg twice daily for many years.  Some of his lower extremity swelling may be exacerbated by calcium channel blocker usage.  Following his colonoscopy, we will plan to taper him off of verapamil along with having him wear an event monitor to evaluate for ectopy/arrhythmia.  We could consider adding beta-blocker therapy in place of calcium channel blocker.  No documented evidence of atrial fibrillation.  Would not add on oral anticoagulation unless this is definitively proven.  3. Lower extremity swelling: Likely multifactorial including morbid obesity with venous insufficiency, and possible calcium channel blocker usage.  Recommend patient elevate legs and continue compression stockings.  Check echocardiogram as above.  Consider tapering off calcium channel blocker as above.  4. Medication management:  Decrease aspirin to 81 mg daily.  5. Diabetes mellitus: Per PCP.  Disposition: F/u with me in 5-6 weeks.  Current medicines are reviewed at length with the patient today.  The patient did not have any concerns regarding medicines.  Signed, Christell Faith, PA-C 10/05/2017 1:59 PM     [place], [place] [address] [phone]

## 2017-10-05 ENCOUNTER — Encounter: Payer: Self-pay | Admitting: Physician Assistant

## 2017-10-05 ENCOUNTER — Ambulatory Visit: Payer: BC Managed Care – PPO | Admitting: Physician Assistant

## 2017-10-05 VITALS — BP 130/74 | HR 66 | Ht 71.0 in | Wt >= 6400 oz

## 2017-10-05 DIAGNOSIS — Z01810 Encounter for preprocedural cardiovascular examination: Secondary | ICD-10-CM

## 2017-10-05 DIAGNOSIS — M7989 Other specified soft tissue disorders: Secondary | ICD-10-CM | POA: Diagnosis not present

## 2017-10-05 DIAGNOSIS — Z79899 Other long term (current) drug therapy: Secondary | ICD-10-CM | POA: Diagnosis not present

## 2017-10-05 DIAGNOSIS — I479 Paroxysmal tachycardia, unspecified: Secondary | ICD-10-CM | POA: Diagnosis not present

## 2017-10-05 DIAGNOSIS — R0602 Shortness of breath: Secondary | ICD-10-CM

## 2017-10-05 MED ORDER — ASPIRIN EC 81 MG PO TBEC: 81.0000 mg | DELAYED_RELEASE_TABLET | Freq: Every day | ORAL | 3 refills | Status: DC

## 2017-10-05 NOTE — Patient Instructions (Signed)
Medication Instructions:  Your physician has recommended you make the following change in your medication:  1- DECREASE Aspirin to 81 mg by mouth once a day.   Labwork: none  Testing/Procedures: Your physician has requested that you have an echocardiogram. Echocardiography is a painless test that uses sound waves to create images of your heart. It provides your doctor with information about the size and shape of your heart and how well your heart's chambers and valves are working. This procedure takes approximately one hour. There are no restrictions for this procedure.    Follow-Up: Your physician recommends that you schedule a follow-up appointment in: SOMETIME [place].  If you need a refill on your cardiac medications before your next appointment, please call your pharmacy.   Echocardiogram An echocardiogram, or echocardiography, uses sound waves (ultrasound) to produce an image of your heart. The echocardiogram is simple, painless, obtained within a short period of time, and offers valuable information to your health care provider. The images from an echocardiogram can provide information such as:  Evidence of coronary artery disease (CAD).  Heart size.  Heart muscle function.  Heart valve function.  Aneurysm detection.  Evidence of a past heart attack.  Fluid buildup around the heart.  Heart muscle thickening.  Assess heart valve function.  Tell a health care provider about:  Any allergies you have.  All medicines you are taking, including vitamins, herbs, eye drops, creams, and over-the-counter medicines.  Any problems you or family members have had with anesthetic medicines.  Any blood disorders you have.  Any surgeries you have had.  Any medical conditions you have.  Whether you are pregnant or may be pregnant. What happens before the procedure? No special preparation is needed. Eat and drink normally. What happens during the  procedure?  In order to produce an image of your heart, gel will be applied to your chest and a wand-like tool (transducer) will be moved over your chest. The gel will help transmit the sound waves from the transducer. The sound waves will harmlessly bounce off your heart to allow the heart images to be captured in real-time motion. These images will then be recorded.  You may need an IV to receive a medicine that improves the quality of the pictures. What happens after the procedure? You may return to your normal schedule including diet, activities, and medicines, unless your health care provider tells you otherwise. This information is not intended to replace advice given to you by your health care provider. Make sure you discuss any questions you have with your health care provider. Document Released: 07/22/2000 Document Revised: 03/12/2016 Document Reviewed: 04/01/2013 Elsevier Interactive Patient Education  2017 [employer].

## 2017-10-06 ENCOUNTER — Other Ambulatory Visit: Payer: Self-pay

## 2017-10-06 DIAGNOSIS — E119 Type 2 diabetes mellitus without complications: Secondary | ICD-10-CM

## 2017-10-06 DIAGNOSIS — J449 Chronic obstructive pulmonary disease, unspecified: Secondary | ICD-10-CM

## 2017-10-06 MED ORDER — METFORMIN HCL 500 MG PO TABS: 500.0000 mg | ORAL_TABLET | Freq: Every day | ORAL | 0 refills | Status: DC

## 2017-10-06 MED ORDER — FLUTICASONE-SALMETEROL 500-50 MCG/DOSE IN AEPB: INHALATION_SPRAY | RESPIRATORY_TRACT | 5 refills | Status: DC

## 2017-10-06 NOTE — Telephone Encounter (Signed)
Last Cr reviewed; A1c reviewed Rxs approved, but I changed the metformin to BEFORE supper

## 2017-10-09 NOTE — Telephone Encounter (Addendum)
Progress note from appointment with Christell Faith on 10/05/17 which addresses clearance routed to [place] at [phone].

## 2017-10-11 ENCOUNTER — Telehealth: Payer: Self-pay | Admitting: Family Medicine

## 2017-10-11 NOTE — Telephone Encounter (Signed)
Pt.notified

## 2017-10-11 NOTE — Telephone Encounter (Signed)
Please let pt know that we were sent a note from [place] concerning his pantoprazole Dr. Manuella Ghazi is no longer here, so I am addressing this Long-term use of pantoprazole has risks, so we're going to have him get any further treatment for heartburn / reflux from his GI specialist We encourage him to discuss the risks of pantoprazole with his GI doctor to see if another option is appropriate Thank you

## 2017-10-17 ENCOUNTER — Other Ambulatory Visit: Payer: Self-pay

## 2017-10-17 ENCOUNTER — Ambulatory Visit (INDEPENDENT_AMBULATORY_CARE_PROVIDER_SITE_OTHER): Payer: BC Managed Care – PPO

## 2017-10-17 DIAGNOSIS — Z01810 Encounter for preprocedural cardiovascular examination: Secondary | ICD-10-CM

## 2017-10-17 DIAGNOSIS — R0602 Shortness of breath: Secondary | ICD-10-CM | POA: Diagnosis not present

## 2017-10-20 ENCOUNTER — Ambulatory Visit: Payer: BC Managed Care – PPO | Admitting: Family Medicine

## 2017-10-26 ENCOUNTER — Telehealth: Payer: Self-pay | Admitting: Cardiovascular Disease

## 2017-10-26 NOTE — Telephone Encounter (Signed)
Clearance already addressed by Christell Faith, PA on the patient's echo report. A copy of Ryan's office note and the patient's echo report were faxed to [place] at [phone]. Confirmation received.

## 2017-10-26 NOTE — Telephone Encounter (Signed)
° °  [facility] Pre-operative Risk Assessment    Request for surgical clearance:  1. What type of surgery is being performed? Colonoscopy    2. When is this surgery scheduled? 11/02/17    3. What type of clearance is required (medical clearance vs. Pharmacy clearance to hold med vs. Both)? Both   4. Are there any medications that need to be held prior to surgery and how long?  Not listed   5. Practice name and name of physician performing surgery? [facility] Dr Donnella Sham   6. What is your office phone and fax number?  Phone [phone] Fax      [phone]   7. Anesthesia type (None, local, MAC, general) ? Monitored

## 2017-11-01 ENCOUNTER — Encounter: Payer: Self-pay | Admitting: *Deleted

## 2017-11-02 ENCOUNTER — Encounter: Payer: Self-pay | Admitting: *Deleted

## 2017-11-02 ENCOUNTER — Other Ambulatory Visit: Payer: Self-pay

## 2017-11-02 ENCOUNTER — Ambulatory Visit: Payer: BC Managed Care – PPO | Admitting: Anesthesiology

## 2017-11-02 ENCOUNTER — Encounter
Admission: RE | Disposition: A | Discharge status: Home / Self Care | Payer: Self-pay | Source: Ambulatory Visit | Attending: Gastroenterology

## 2017-11-02 ENCOUNTER — Ambulatory Visit
Admission: RE | Admit: 2017-11-02 | Discharge: 2017-11-02 | Disposition: A | Discharge status: Home / Self Care | Payer: BC Managed Care – PPO | Source: Ambulatory Visit | Attending: Gastroenterology | Admitting: Gastroenterology

## 2017-11-02 DIAGNOSIS — K219 Gastro-esophageal reflux disease without esophagitis: Secondary | ICD-10-CM | POA: Insufficient documentation

## 2017-11-02 DIAGNOSIS — J449 Chronic obstructive pulmonary disease, unspecified: Secondary | ICD-10-CM | POA: Diagnosis not present

## 2017-11-02 DIAGNOSIS — D124 Benign neoplasm of descending colon: Secondary | ICD-10-CM | POA: Diagnosis not present

## 2017-11-02 DIAGNOSIS — Z7984 Long term (current) use of oral hypoglycemic drugs: Secondary | ICD-10-CM | POA: Diagnosis not present

## 2017-11-02 DIAGNOSIS — E119 Type 2 diabetes mellitus without complications: Secondary | ICD-10-CM | POA: Insufficient documentation

## 2017-11-02 DIAGNOSIS — D123 Benign neoplasm of transverse colon: Secondary | ICD-10-CM | POA: Diagnosis not present

## 2017-11-02 DIAGNOSIS — K5730 Diverticulosis of large intestine without perforation or abscess without bleeding: Secondary | ICD-10-CM | POA: Insufficient documentation

## 2017-11-02 DIAGNOSIS — Z79899 Other long term (current) drug therapy: Secondary | ICD-10-CM | POA: Insufficient documentation

## 2017-11-02 DIAGNOSIS — K640 First degree hemorrhoids: Secondary | ICD-10-CM | POA: Diagnosis not present

## 2017-11-02 DIAGNOSIS — Z7982 Long term (current) use of aspirin: Secondary | ICD-10-CM | POA: Diagnosis not present

## 2017-11-02 DIAGNOSIS — D125 Benign neoplasm of sigmoid colon: Secondary | ICD-10-CM | POA: Diagnosis not present

## 2017-11-02 DIAGNOSIS — Z86010 Personal history of colonic polyps: Secondary | ICD-10-CM | POA: Diagnosis not present

## 2017-11-02 DIAGNOSIS — I447 Left bundle-branch block, unspecified: Secondary | ICD-10-CM | POA: Diagnosis not present

## 2017-11-02 DIAGNOSIS — Z1211 Encounter for screening for malignant neoplasm of colon: Secondary | ICD-10-CM | POA: Insufficient documentation

## 2017-11-02 DIAGNOSIS — Z87891 Personal history of nicotine dependence: Secondary | ICD-10-CM | POA: Diagnosis not present

## 2017-11-02 DIAGNOSIS — Z6841 Body Mass Index (BMI) 40.0 and over, adult: Secondary | ICD-10-CM | POA: Insufficient documentation

## 2017-11-02 DIAGNOSIS — I872 Venous insufficiency (chronic) (peripheral): Secondary | ICD-10-CM | POA: Diagnosis not present

## 2017-11-02 HISTORY — PX: COLONOSCOPY WITH PROPOFOL: SHX5780

## 2017-11-02 HISTORY — DX: Left bundle-branch block, unspecified: I44.7

## 2017-11-02 SURGERY — COLONOSCOPY WITH PROPOFOL
Anesthesia: General

## 2017-11-02 MED ORDER — SODIUM CHLORIDE 0.9 % IV SOLN
INTRAVENOUS | Status: DC
  Administered 2017-11-02: 08:00:00 via INTRAVENOUS

## 2017-11-02 MED ORDER — PROPOFOL 500 MG/50ML IV EMUL
INTRAVENOUS | Status: DC | PRN
  Administered 2017-11-02: 140 ug/kg/min via INTRAVENOUS

## 2017-11-02 MED ORDER — PROPOFOL 10 MG/ML IV BOLUS
INTRAVENOUS | Status: DC | PRN
  Administered 2017-11-02: 27 mg via INTRAVENOUS
  Administered 2017-11-02: 100 mg via INTRAVENOUS
  Administered 2017-11-02: 18 mg via INTRAVENOUS
  Administered 2017-11-02: 27 mg via INTRAVENOUS

## 2017-11-02 MED ORDER — LIDOCAINE HCL (PF) 2 % IJ SOLN
INTRAMUSCULAR | Status: AC
  Filled 2017-11-02: qty 10

## 2017-11-02 MED ORDER — LIDOCAINE HCL (CARDIAC) 20 MG/ML IV SOLN
INTRAVENOUS | Status: DC | PRN
  Administered 2017-11-02: 50 mg via INTRAVENOUS

## 2017-11-02 MED ORDER — PROPOFOL 500 MG/50ML IV EMUL
INTRAVENOUS | Status: AC
  Filled 2017-11-02: qty 50

## 2017-11-02 MED ORDER — SODIUM CHLORIDE 0.9 % IV SOLN: INTRAVENOUS | Status: DC

## 2017-11-02 NOTE — Anesthesia Postprocedure Evaluation (Signed)
Anesthesia Post Note  Patient: Michael Boyle  Procedure(s) Performed: COLONOSCOPY WITH PROPOFOL (N/A )  Patient location during evaluation: Endoscopy Anesthesia Type: General Level of consciousness: awake and alert Pain management: pain level controlled Vital Signs Assessment: post-procedure vital signs reviewed and stable Respiratory status: spontaneous breathing, nonlabored ventilation, respiratory function stable and patient connected to nasal cannula oxygen Cardiovascular status: blood pressure returned to baseline and stable Postop Assessment: no apparent nausea or vomiting Anesthetic complications: no     Last Vitals:  Vitals:   11/02/17 1030 11/02/17 1040  BP: 123/72 121/68  Pulse:    Resp:    Temp:    SpO2:      Last Pain:  Vitals:   11/02/17 1010  TempSrc: Tympanic  PainSc: 0-No pain                 Michael Boyle S

## 2017-11-02 NOTE — Op Note (Signed)
[facility] Gastroenterology Patient Name: Michael Boyle Procedure Date: 11/02/2017 8:52 AM MRN: [id number] Account #: [id number] Date of Birth: March 21, 1966 Admit Type: Outpatient Age: 52 Room: [facility] ENDO ROOM 1 Gender: Male Note Status: Finalized Procedure:            Colonoscopy Indications:          Personal history of colonic polyps Providers:            Lollie Sails, MD Referring MD:         Otila Back. Manuella Ghazi (Referring MD) Medicines:            Monitored Anesthesia Care Complications:        No immediate complications. Procedure:            Pre-Anesthesia Assessment:                       - ASA Grade Assessment: III - A patient with severe                        systemic disease.                       After obtaining informed consent, the colonoscope was                        passed under direct vision. Throughout the procedure,                        the patient's blood pressure, pulse, and oxygen                        saturations were monitored continuously. The                        Colonoscope was introduced through the anus and                        advanced to the the ileocecal valve. The quality of the                        bowel preparation was fair. Findings:      Multiple small-mouthed diverticula were found in the sigmoid colon and       descending colon. I was unable to move the scope into the base of the       cecum, however seen from a distance I saw no larger lesions.      A 3 mm polyp was found in the descending colon. The polyp was sessile.       The polyp was removed with a cold biopsy forceps. Resection and       retrieval were complete.      A 7 mm polyp was found in the distal transverse colon. The polyp was       sessile. The polyp was removed with a cold snare. Resection and       retrieval were complete.      Six sessile polyps were found in the proximal transverse colon. The       polyps were 2 to 5 mm in size. These polyps  were removed with a cold       snare. Resection and retrieval were complete.      Two  sessile polyps were found in the hepatic flexure. The polyps were 10       to 15 mm in size. These polyps were removed with a lift and       cut/piecemeal technique using a cold snare, with cold forcep around       edges. Resection and retrieval were complete.      A 3 mm polyp was found in the proximal sigmoid colon. The polyp was       sessile. The polyp was removed with a cold biopsy forceps. Resection and       retrieval were complete.      The retroflexed view of the distal rectum and anal verge was normal and       showed no anal or rectal abnormalities.      Non-bleeding internal hemorrhoids were found during anoscopy. The       hemorrhoids were medium-sized and Grade I (internal hemorrhoids that do       not prolapse).      The digital rectal exam was normal. Impression:           - Preparation of the colon was fair.                       - Diverticulosis in the sigmoid colon and in the                        descending colon.                       - One 3 mm polyp in the descending colon, removed with                        a cold biopsy forceps. Resected and retrieved.                       - One 7 mm polyp in the distal transverse colon,                        removed with a cold snare. Resected and retrieved.                       - Six 2 to 5 mm polyps in the proximal transverse                        colon, removed with a cold snare. Resected and                        retrieved.                       - Two 10 to 15 mm polyps at the hepatic flexure,                        removed using lift and cut and a cold snare. Resected                        and retrieved.                       - One 3 mm polyp in the proximal sigmoid colon, removed  with a cold biopsy forceps. Resected and retrieved.                       - The distal rectum and anal verge are normal on                         retroflexion view.                       - Non-bleeding internal hemorrhoids. Recommendation:       - Clear liquid diet today.                       - Full liquid diet for 1 day, then advance as tolerated                        to low residue diet for 3 days.                       - An appointment has been made (or make an appointment)                        to see Oncology for genetic testing to rule out Lynch                        syndrome. at appointment to be scheduled Lollie Sails, MD 11/02/2017 10:17:40 AM This report has been signed electronically. Number of Addenda: 0 Note Initiated On: 11/02/2017 8:52 AM Scope Withdrawal Time: 0 hours 14 minutes 44 seconds  Total Procedure Duration: 1 hour 1 minute 15 seconds       [facility]

## 2017-11-02 NOTE — Anesthesia Post-op Follow-up Note (Signed)
Anesthesia QCDR form completed.        

## 2017-11-02 NOTE — Transfer of Care (Signed)
Immediate Anesthesia Transfer of Care Note  Patient: Michael Boyle  Procedure(s) Performed: COLONOSCOPY WITH PROPOFOL (N/A )  Patient Location: PACU and Endoscopy Unit  Anesthesia Type:General  Level of Consciousness: awake, alert , oriented and patient cooperative  Airway & Oxygen Therapy: Patient Spontanous Breathing and Patient connected to nasal cannula oxygen  Post-op Assessment: Report given to RN and Post -op Vital signs reviewed and stable  Post vital signs: Reviewed and stable  Last Vitals:  Vitals Value Taken Time  BP 133/71 11/02/2017 10:11 AM  Temp 36.1 C 11/02/2017 10:10 AM  Pulse 86 11/02/2017 10:12 AM  Resp 19 11/02/2017 10:12 AM  SpO2 96 % 11/02/2017 10:12 AM  Vitals shown include unvalidated device data.  Last Pain:  Vitals:   11/02/17 1010  TempSrc: Tympanic  PainSc: 0-No pain         Complications: No apparent anesthesia complications

## 2017-11-02 NOTE — Anesthesia Preprocedure Evaluation (Signed)
Anesthesia Evaluation  Patient identified by MRN, date of birth, ID band Patient awake    Reviewed: Allergy & Precautions, NPO status , Patient's Chart, lab work & pertinent test results, reviewed documented beta blocker date and time   Airway Mallampati: III  TM Distance: >3 FB     Dental  (+) Chipped   Pulmonary COPD, former smoker,           Cardiovascular + dysrhythmias      Neuro/Psych    GI/Hepatic GERD  ,  Endo/Other  diabetes, Type 2Morbid obesity  Renal/GU      Musculoskeletal   Abdominal   Peds  Hematology   Anesthesia Other Findings   Reproductive/Obstetrics                             Anesthesia Physical Anesthesia Plan  ASA: III  Anesthesia Plan: General   Post-op Pain Management:    Induction: Intravenous  PONV Risk Score and Plan:   Airway Management Planned:   Additional Equipment:   Intra-op Plan:   Post-operative Plan:   Informed Consent: I have reviewed the patients History and Physical, chart, labs and discussed the procedure including the risks, benefits and alternatives for the proposed anesthesia with the patient or authorized representative who has indicated his/her understanding and acceptance.     Plan Discussed with: CRNA  Anesthesia Plan Comments:         Anesthesia Quick Evaluation

## 2017-11-02 NOTE — H&P (Signed)
Outpatient short stay form Pre-procedure 11/02/2017 8:57 AM Michael Sails MD  Primary Physician: Tommas Olp  Reason for visit: Colonoscopy  History of present illness: Patient is a 52 year old male presenting today for a colonoscopy.  His last colonoscopy was 08/14/2015.  At that time there were 17 colon polyps removed most of these being adenomas.  Tolerated his prep well.  He takes no aspirin or blood thinning agent with the exception of 81 mg aspirin.    Current Facility-Administered Medications:  .  0.9 %  sodium chloride infusion, , Intravenous, Continuous, Michael Sails, MD, Last Rate: 20 mL/hr at 11/02/17 0807 .  0.9 %  sodium chloride infusion, , Intravenous, Continuous, Michael Sails, MD  Medications Prior to Admission  Medication Sig Dispense Refill Last Dose  . allopurinol (ZYLOPRIM) 100 MG tablet Take 1 tablet (100 mg total) by mouth 2 (two) times daily. 180 tablet 1 11/01/2017 at 2030  . aspirin EC 81 MG tablet Take 1 tablet (81 mg total) by mouth daily. 90 tablet 3 10/28/2017 at Unknown time  . Fluticasone-Salmeterol (ADVAIR DISKUS) 500-50 MCG/DOSE AEPB Inhale 1 puff into the lungs 2 (two) times daily. 60 each 5 11/02/2017 at 0500  . metFORMIN (GLUCOPHAGE) 500 MG tablet Take 1 tablet (500 mg total) by mouth daily before supper. 90 tablet 0 10/31/2017 at Unknown time  . torsemide (DEMADEX) 20 MG tablet Take 1 tablet (20 mg total) by mouth 2 (two) times daily. 180 tablet 1 11/01/2017 at 0500  . verapamil (CALAN-SR) 180 MG CR tablet Take 1 tablet (180 mg total) by mouth 2 (two) times daily. 180 tablet 1 11/01/2017 at 2030  . Vitamin D, Ergocalciferol, (DRISDOL) 50000 units CAPS capsule Take 1 capsule (50,000 Units total) by mouth once a week. For 12 weeks 12 capsule 0 Past Week at Unknown time  . pantoprazole (PROTONIX) 40 MG tablet Take 1 tablet (40 mg total) by mouth daily. 90 tablet 0 Taking  . potassium chloride SA (KLOR-CON M20) 20 MEQ tablet Take 1 tablet (20 mEq  total) by mouth daily. 90 tablet 1 Taking     No Known Allergies   Past Medical History:  Diagnosis Date  . COPD (chronic obstructive pulmonary disease) ([place])   . Diabetes mellitus with complication ([place])   . GERD (gastroesophageal reflux disease)   . Gout   . Incomplete left bundle branch block   . Incomplete left bundle branch block (LBBB)    a. TTE 4/17: EF of 60-65%, unable to assess wall motion, normal LV diastolic function parameters, mildly calcifief mitral annulus  . Morbid obesity ([place])   . Palpitations    a. felt to be pSVT  . Palpitations   . Venous insufficiency     Review of systems:      Physical Exam    Heart and lungs: Regular rate and rhythm without rub or gallop, lungs are bilaterally clear.    HEENT: Normocephalic atraumatic eyes are anicteric    Other:    Pertinant exam for procedure: Protuberant/obese, bowel sounds are positive normoactive.  Is not palpable possible to palpate internal organs.  He is nontender nondistended.    Planned proceedures: Colonoscopy and indicated procedures. I have discussed the risks benefits and complications of procedures to include not limited to bleeding, infection, perforation and the risk of sedation and the patient wishes to proceed.    Michael Sails, MD Gastroenterology 11/02/2017  8:57 AM

## 2017-11-03 LAB — SURGICAL PATHOLOGY

## 2017-11-06 ENCOUNTER — Ambulatory Visit: Payer: BC Managed Care – PPO | Admitting: Physician Assistant

## 2017-11-06 ENCOUNTER — Encounter: Payer: Self-pay | Admitting: Gastroenterology

## 2017-11-13 ENCOUNTER — Encounter: Payer: Self-pay | Admitting: Genetic Counselor

## 2017-11-30 ENCOUNTER — Encounter: Payer: Self-pay | Admitting: Genetic Counselor

## 2017-11-30 ENCOUNTER — Inpatient Hospital Stay: Payer: BC Managed Care – PPO | Attending: Genetic Counselor | Admitting: Genetic Counselor

## 2017-11-30 ENCOUNTER — Inpatient Hospital Stay: Payer: BC Managed Care – PPO

## 2017-11-30 DIAGNOSIS — Z1379 Encounter for other screening for genetic and chromosomal anomalies: Secondary | ICD-10-CM

## 2017-11-30 DIAGNOSIS — D126 Benign neoplasm of colon, unspecified: Secondary | ICD-10-CM

## 2017-11-30 DIAGNOSIS — K635 Polyp of colon: Secondary | ICD-10-CM | POA: Insufficient documentation

## 2017-11-30 NOTE — Progress Notes (Signed)
[facility]      Initial Visit   Patient Name: Michael Boyle Patient DOB: 1966/01/25 Patient Age: 52 y.o. Encounter Date: 11/30/2017  Referring Provider: Loistine Simas, MD  Primary Care Provider: Arnetha Courser, MD  Reason for Visit: Evaluate for hereditary susceptibility to cancer    Assessment and Plan:  . 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 There are both recessive and dominant genetic causes to polyposis that need to be evaluated.   . Testing is recommended to determine whether he has a pathogenic mutation that will impact his screening and risk-reduction for cancer. A negative result will be reassuring, but he understood that he will be screened often due to his polyp burden.  . Michael Boyle wished to pursue genetic testing and a blood sample will be sent for analysis of the 47 genes on [facility]'s Common Cancers panel (APC, ATM, AXIN2, BARD1, BMPR1A, BRCA1, BRCA2, BRIP1, CDH1, CDK4, CDKN2A, CHEK2, CTNNA1, DICER1, EPCAM, GREM1, HOXB13, KIT, MEN1, MLH1, MSH2, MSH3, MSH6, MUTYH, NBN, NF1, NTHL1, PALB2, PDGFRA, PMS2, POLD1, POLE, PTEN, RAD50, RAD51C, RAD51D, SDHA, SDHB, SDHC, SDHD, SMAD4, SMARCA4, STK11, TP53, TSC1, TSC2, VHL).   . Results should be available in approximately 2-4 weeks, at which point we will contact him and address implications for him as well as address genetic testing for at-risk family members, if needed.     Dr. Jana Hakim was available for questions concerning this case. Total time spent by me in face-to-face counseling was approximately 30 minutes.   _____________________________________________________________________   History of Present Illness: Mr. Michael Boyle, a 52 y.o. male, is being seen at the [facility] due to a personal history of colon  polyps. He presents to clinic today to discuss the possibility of a hereditary predisposition to cancer and discuss whether genetic testing is warranted.  Michael Boyle has no personal history of cancer. He has a history of multiple colon polyps: 08/14/2015- first colonoscopy in which 17 polyps were removed from different parts of the colon. At least 12-13 were tubular adenomas. 11/02/17- 11 tubular adenomas removed.  Past Medical History:  Diagnosis Date  . Colon polyps   . COPD (chronic obstructive pulmonary disease) ([place])   . Diabetes mellitus with complication ([place])   . GERD (gastroesophageal reflux disease)   . Gout   . Incomplete left bundle branch block   . Incomplete left bundle branch block (LBBB)    a. TTE 4/17: EF of 60-65%, unable to assess wall motion, normal LV diastolic function parameters, mildly calcifief mitral annulus  . Morbid obesity ([place])   . Palpitations    a. felt to be pSVT  . Palpitations   . Venous insufficiency     Past Surgical History:  Procedure Laterality Date  . COLONOSCOPY WITH PROPOFOL N/A 08/14/2015   Procedure: COLONOSCOPY WITH PROPOFOL;  Surgeon: Lollie Sails, MD;  Location: [facility] ENDOSCOPY;  Service: Endoscopy;  Laterality: N/A;  . COLONOSCOPY WITH PROPOFOL N/A 11/02/2017   Procedure: COLONOSCOPY WITH PROPOFOL;  Surgeon: Lollie Sails, MD;  Location: [facility] ENDOSCOPY;  Service: Endoscopy;  Laterality: N/A;  . ESOPHAGOGASTRODUODENOSCOPY (EGD) WITH PROPOFOL N/A 08/14/2015   Procedure: ESOPHAGOGASTRODUODENOSCOPY (EGD) WITH PROPOFOL;  Surgeon: Lollie Sails, MD;  Location: [facility] ENDOSCOPY;  Service: Endoscopy;  Laterality: N/A;  . WISDOM TOOTH EXTRACTION  Social History   Socioeconomic History  . Marital status: Married    Spouse name: Not on file  . Number of children: Not on file  . Years of education: Not on file  . Highest education level: Not on file  Occupational History  . Not on file  Social Needs  . Financial resource  strain: Not on file  . Food insecurity:    Worry: Not on file    Inability: Not on file  . Transportation needs:    Medical: Not on file    Non-medical: Not on file  Tobacco Use  . Smoking status: Former Smoker    Years: 5.00    Types: Cigarettes    Last attempt to quit: 06/13/2012    Years since quitting: 5.4  . Smokeless tobacco: Never Used  Substance and Sexual Activity  . Alcohol use: No    Alcohol/week: 0.0 oz  . Drug use: No  . Sexual activity: Not on file  Lifestyle  . Physical activity:    Days per week: Not on file    Minutes per session: Not on file  . Stress: Not on file  Relationships  . Social connections:    Talks on phone: Not on file    Gets together: Not on file    Attends religious service: Not on file    Active member of club or organization: Not on file    Attends meetings of clubs or organizations: Not on file    Relationship status: Not on file  Other Topics Concern  . Not on file  Social History Narrative  . Not on file     Family History:  During the visit, a 4-generation pedigree was obtained. Family tree will be scanned in the Media tab in Epic  Significant diagnoses include the following:  Family History  Problem Relation Age of Onset  . Cancer Sister        lymphoma dx 22s; currently 68  . Diabetes Sister   . Cirrhosis Sister   . Colon cancer Paternal Uncle        dx 18s; deceased 18  . Prostate cancer Paternal Uncle        dx 54s; currently 19    Additionally, Michael Boyle has no children. He has a total of 4 sisters and a twin brother. His mother (age 85) had 53 brothers and a sister. His father died at 84, unrelated to cancer. He had 2 sisters.  Michael Boyle ancestry is Caucasian - NOS. There is no known Jewish ancestry and no consanguinity.  Discussion: We reviewed the characteristics, features and inheritance patterns of hereditary cancer syndromes, with a focus on both dominant and recessive causes to hereditary polyposis.  We discussed his risk of harboring a mutation in the context of his personal and family history. We discussed the process of genetic testing, insurance coverage and implications of results: positive, negative and variant of unknown significance (VUS).    Mr. Rhine questions were answered to his satisfaction today and he is welcome to call with any additional questions or concerns. Thank you for the referral and allowing [place] to share in the care of your patient.    Steele Berg, MS, [place] Certified Genetic Counselor phone: [phone] [email or web address]

## 2017-12-07 ENCOUNTER — Encounter: Payer: Self-pay | Admitting: Genetic Counselor

## 2017-12-07 ENCOUNTER — Ambulatory Visit: Payer: Self-pay | Admitting: Genetic Counselor

## 2017-12-07 DIAGNOSIS — Z1379 Encounter for other screening for genetic and chromosomal anomalies: Secondary | ICD-10-CM

## 2017-12-07 HISTORY — DX: Encounter for other screening for genetic and chromosomal anomalies: Z13.79

## 2017-12-07 NOTE — Progress Notes (Signed)
Cancer Genetics Clinic       Genetic Test Results    Patient Name: Michael Boyle Patient DOB: November 19, 1965 Patient Age: 52 y.o. Encounter Date: 12/07/2017  Referring Provider: Loistine Simas MD  Primary Care Provider: Arnetha Courser, MD   Mr. Sessums was called today to discuss genetic test results. Please see the Genetics note from his visit on 11/30/2017 for a detailed discussion of his personal and family history.  Genetic Testing: At the time of Mr. Neuman' visit, he decided to pursue genetic testing of multiple genes associated with hereditary susceptibility to cancer. Testing included sequencing and deletion/duplication analysis. Testing did not reveal a pathogenic mutation in any of the genes analyzed.  A copy of the genetic test report will be scanned into Epic under the Media tab.  The genes analyzed were the 47 genes on [facility]'s Common Cancers panel (APC, ATM, AXIN2, BARD1, BMPR1A, BRCA1, BRCA2, BRIP1, CDH1, CDK4, CDKN2A, CHEK2, CTNNA1, DICER1, EPCAM, GREM1, HOXB13, KIT, MEN1, MLH1, MSH2, MSH3, MSH6, MUTYH, NBN, NF1, NTHL1, PALB2, PDGFRA, PMS2, POLD1, POLE, PTEN, RAD50, RAD51C, RAD51D, SDHA, SDHB, SDHC, SDHD, SMAD4, SMARCA4, STK11, TP53, TSC1, TSC2, VHL).  Since the current test is not perfect, it is possible that there may be a gene mutation that current testing cannot detect, but that chance is small. It is possible that a different genetic factor, which has not yet been discovered or is not on this panel, is responsible for the diagnoses in the family. Again, the likelihood of this is low. No additional testing is recommended at this time for Mr. Ticas.  Cancer Screening: These results are reassuring and indicate that Mr. Allen does not likely have an increased risk of cancer due to a mutation in one of these genes. He is recommended to undergo cancer screenings for individuals in the general population, but understands his colon screenings will be  based on his polyp burden.  Family Members: Family members may be at some increased risk of developing cancer, over the general population risk, simply due to the family history. They are recommended to speak with their own providers about appropriate cancer screenings.  Any relative who had cancer at a young age or had a particularly rare cancer may also wish to pursue genetic testing. Genetic counselors can be located in other cities, by visiting the website of the [employer] (Artist[employer].se) and Field seismologist for a Dietitian by zip code.   Lastly, cancer genetics is a rapidly advancing field and it is possible that new genetic tests will be appropriate for Mr. Fodor in the future. We encourage him to remain in contact with [place] on an annual basis so we can update his personal and family histories, and let him know of advances in cancer genetics that may benefit the family. Our contact number was provided. Mr. Hatfield is welcome to call anytime with additional questions.     Steele Berg, MS, [place] Certified Genetic Counselor phone: [phone]

## 2017-12-12 ENCOUNTER — Encounter: Payer: Self-pay | Admitting: Physician Assistant

## 2017-12-12 ENCOUNTER — Ambulatory Visit: Payer: BC Managed Care – PPO | Admitting: Physician Assistant

## 2017-12-12 VITALS — BP 126/62 | HR 61 | Ht 71.0 in | Wt >= 6400 oz

## 2017-12-12 DIAGNOSIS — R0609 Other forms of dyspnea: Secondary | ICD-10-CM | POA: Diagnosis not present

## 2017-12-12 DIAGNOSIS — E119 Type 2 diabetes mellitus without complications: Secondary | ICD-10-CM

## 2017-12-12 DIAGNOSIS — K219 Gastro-esophageal reflux disease without esophagitis: Secondary | ICD-10-CM | POA: Diagnosis not present

## 2017-12-12 DIAGNOSIS — I479 Paroxysmal tachycardia, unspecified: Secondary | ICD-10-CM

## 2017-12-12 DIAGNOSIS — Z01810 Encounter for preprocedural cardiovascular examination: Secondary | ICD-10-CM | POA: Diagnosis not present

## 2017-12-12 DIAGNOSIS — M7989 Other specified soft tissue disorders: Secondary | ICD-10-CM | POA: Diagnosis not present

## 2017-12-12 MED ORDER — PANTOPRAZOLE SODIUM 40 MG PO TBEC: 40.0000 mg | DELAYED_RELEASE_TABLET | Freq: Every day | ORAL | 3 refills | Status: DC

## 2017-12-12 NOTE — Progress Notes (Signed)
Cardiology Office Note Date:  12/12/2017  Patient ID:  Michael Boyle, Michael Boyle 1965-10-02, MRN [id number] PCP:  Arnetha Courser, MD  Cardiologist:  Dr. Fletcher Anon, MD    Chief Complaint: Follow-up  History of Present Illness: Michael Boyle is a 52 y.o. male with history of paroxysmal tachycardia, incomplete LBBB, DM, chronic venous insufficiency, morbid obesity, COPD from prior tobacco abuse for 30 years at 2 packs daily quitting 06/2012, GERD, and gout who presents for follow-up.   Patient was previously followed by Dr. Ubaldo Glassing, though transitioned to Dr. Fletcher Anon for evaluation of abnormal EKG. 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 He was managed on verapamil while being followed by Dr. Ubaldo Glassing without recurrent symptoms. Prior EKG in 2017 was done at his PCP's office which showed an incomplete LBBB. He denied any chest pain or discomfort at that time. He did note mild exertional dyspnea and was taking torsemide 20 mg daily for lower extremity edema. He underwent echo in 11/2015 that showed an EF of 60-65%, unable to assess wall motion, normal LV diastolic function parameters, mildly calcified mitral annulus. Ischemic evaluation was not felt to be indicated given the patient had no symptoms of chest pain and the overall suspicion for ischemic heart disease was low. He was continued on verapamil. He was most recently seen in the office on 10/05/2017 for preprocedural cardiac evaluation for colonoscopy. He was doing well at that time, and noted exertional dyspnea when walking up stairs or an incline. Given his shortness of breath, he underwent echocardiogram on 10/17/2017 that showed an EF of 55 to 65%, mildly dilated LV with moderate concentric LVH, normal diastolic function, mild biatrial enlargement, mildly to moderately dilated RV with normal systolic function. He has since undergone colonoscopy on 11/02/2017 with several  nonmalignant polyps removed.  He comes in doing well today from a cardiac perspective.  No chest pain, palpitations, dizziness, presyncope, or syncope.  He does continue to report stable, exertional dyspnea when ambulating up an incline.  He is asymptomatic when ambulating on flat ground.  Never with exertional chest pain.  He attributes this dyspnea to his morbid obesity.  He reports possibly a fleeting episode of palpitations that lasted for a couple of seconds 2 to 4 weeks prior.  Prior to this episode he indicates he had not had any palpitations for approximately 12 months.  He reports his episodes of palpitations occur possibly once to twice per year and last for several seconds before spontaneously resolving.  He continues to tolerate and do well with verapamil.  Lower extremity swelling is chronic and stable.  He requests a refill of his Protonix.  He still needs to establish with a PCP.  He does not have any active concerns at this time.  Past Medical History:  Diagnosis Date  . Colon polyps   . COPD (chronic obstructive pulmonary disease) ([place])   . Diabetes mellitus with complication ([place])   . Genetic testing 12/07/2017   Common Cancers panel (47 genes) @ [facility] - No pathogenic mutations detected  . GERD (gastroesophageal reflux disease)   . Gout   . Incomplete left bundle branch block   . Incomplete left bundle branch block (LBBB)    a. TTE 4/17: EF of 60-65%, unable to assess wall motion, normal LV diastolic function parameters, mildly calcifief mitral annulus  . Morbid obesity ([place])   . Palpitations    a. felt to be  pSVT  . Palpitations   . Venous insufficiency     Past Surgical History:  Procedure Laterality Date  . COLONOSCOPY WITH PROPOFOL N/A 08/14/2015   Procedure: COLONOSCOPY WITH PROPOFOL;  Surgeon: Lollie Sails, MD;  Location: [facility] ENDOSCOPY;  Service: Endoscopy;  Laterality: N/A;  . COLONOSCOPY WITH PROPOFOL N/A 11/02/2017   Procedure: COLONOSCOPY WITH PROPOFOL;   Surgeon: Lollie Sails, MD;  Location: [facility] ENDOSCOPY;  Service: Endoscopy;  Laterality: N/A;  . ESOPHAGOGASTRODUODENOSCOPY (EGD) WITH PROPOFOL N/A 08/14/2015   Procedure: ESOPHAGOGASTRODUODENOSCOPY (EGD) WITH PROPOFOL;  Surgeon: Lollie Sails, MD;  Location: [facility] ENDOSCOPY;  Service: Endoscopy;  Laterality: N/A;  . WISDOM TOOTH EXTRACTION      Current Meds  Medication Sig  . allopurinol (ZYLOPRIM) 100 MG tablet Take 1 tablet (100 mg total) by mouth 2 (two) times daily.  Marland Kitchen aspirin EC 81 MG tablet Take 1 tablet (81 mg total) by mouth daily.  . Fluticasone-Salmeterol (ADVAIR DISKUS) 500-50 MCG/DOSE AEPB Inhale 1 puff into the lungs 2 (two) times daily.  . metFORMIN (GLUCOPHAGE) 500 MG tablet Take 1 tablet (500 mg total) by mouth daily before supper.  . pantoprazole (PROTONIX) 40 MG tablet Take 1 tablet (40 mg total) by mouth daily.  . potassium chloride SA (KLOR-CON M20) 20 MEQ tablet Take 1 tablet (20 mEq total) by mouth daily.  Marland Kitchen torsemide (DEMADEX) 20 MG tablet Take 1 tablet (20 mg total) by mouth 2 (two) times daily.  . verapamil (CALAN-SR) 180 MG CR tablet Take 1 tablet (180 mg total) by mouth 2 (two) times daily.  . [DISCONTINUED] pantoprazole (PROTONIX) 40 MG tablet Take 1 tablet (40 mg total) by mouth daily.    Allergies:   Patient has no known allergies.   Social History:  The patient  reports that he quit smoking about 5 years ago. His smoking use included cigarettes. He quit after 5.00 years of use. He has never used smokeless tobacco. He reports that he does not drink alcohol or use drugs.   Family History:  The patient's family history includes Cancer in his sister; Cirrhosis in his sister; Colon cancer in his paternal uncle; Diabetes in his sister; Prostate cancer in his paternal uncle.  ROS:   Review of Systems  Constitutional: Negative for chills, diaphoresis, fever, malaise/fatigue and weight loss.  HENT: Negative for congestion.   Eyes: Negative for discharge and  redness.  Respiratory: Positive for shortness of breath. Negative for cough, hemoptysis, sputum production and wheezing.   Cardiovascular: Positive for leg swelling. Negative for chest pain, palpitations, orthopnea, claudication and PND.  Gastrointestinal: Negative for abdominal pain, blood in stool, heartburn, melena, nausea and vomiting.  Genitourinary: Negative for hematuria.  Musculoskeletal: Negative for falls and myalgias.  Skin: Negative for rash.  Neurological: Negative for dizziness, tingling, tremors, sensory change, speech change, focal weakness, loss of consciousness and weakness.  Endo/Heme/Allergies: Does not bruise/bleed easily.  Psychiatric/Behavioral: Negative for substance abuse. The patient is not nervous/anxious.   All other systems reviewed and are negative.    PHYSICAL EXAM:  VS:  BP 126/62 (BP Location: Left Arm, Patient Position: Sitting, Cuff Size: Large)   Pulse 61   Ht 5\' 11"  (1.803 m)   Wt (!) 406 lb 8 oz (184.4 kg)   BMI 56.70 kg/m  BMI: Body mass index is 56.7 kg/m.  Physical Exam  Constitutional: He is oriented to person, place, and time. He appears well-developed and well-nourished.  HENT:  Head: Normocephalic and atraumatic.  Eyes: Right eye exhibits  no discharge. Left eye exhibits no discharge.  Neck: Normal range of motion. No JVD present.  Cardiovascular: Normal rate, regular rhythm, S1 normal, S2 normal and normal heart sounds. Exam reveals no distant heart sounds, no friction rub, no midsystolic click and no opening snap.  No murmur heard. Pulses:      Posterior tibial pulses are 2+ on the right side, and 2+ on the left side.  Pulmonary/Chest: Effort normal and breath sounds normal. No respiratory distress. He has no decreased breath sounds. He has no wheezes. He has no rales. He exhibits no tenderness.  Abdominal: Soft. He exhibits no distension. There is no tenderness.  Musculoskeletal: He exhibits edema.  Trace to 1+ bilateral pretibial  edema  Neurological: He is alert and oriented to person, place, and time.  Skin: Skin is warm and dry. No cyanosis. Nails show no clubbing.  Psychiatric: He has a normal mood and affect. His speech is normal and behavior is normal. Judgment and thought content normal.     EKG:  Was ordered and interpreted by me today. Shows NSR, 61 bpm, poor R wave progression, no acute ST-T changes  Recent Labs: 03/24/2017: Hemoglobin 14.7; Platelets 312; TSH 1.47 04/24/2017: ALT 26; BUN 10; Creat 0.93; Potassium 4.4; Sodium 140  04/24/2017: Cholesterol 172; HDL 40; LDL Cholesterol (Calc) 108; Total CHOL/HDL Ratio 4.3; Triglycerides 126   CrCl cannot be calculated (Patient's most recent lab result is older than the maximum 21 days allowed.).   Wt Readings from Last 3 Encounters:  12/12/17 (!) 406 lb 8 oz (184.4 kg)  11/02/17 (!) 400 lb (181.4 kg)  10/05/17 (!) 413 lb 8 oz (187.6 kg)     Other studies reviewed: Additional studies/records reviewed today include: summarized above  ASSESSMENT AND PLAN:  1. Presumed paroxysmal SVT: Asymptomatic.  No prior formal cardiac monitoring available for review.  He does report episodes of symptomatic tachypalpitations that have PVC required adenosine with resolution of symptoms.  He feels like he has previously been told he has paroxysmal SVT.  He has been maintained on verapamil for many years.  We again discussed the possibility of transitioning him from a calcium channel blocker to an alternative medication giving his lower extremity swelling however, because he has done so well on verapamil for numerous years she prefers to remain on verapamil 180 mg twice daily.  We also discussed possible formal cardiac monitoring, however because of the infrequency of his palpitations this has been deferred at this time.  He was advised that should he have increase in frequency of palpitations to contact us and we could pursue formal cardiac monitoring.  2. Exertional dyspnea:  Likely in the setting of his morbid obesity with physical deconditioning.  He only has this dyspnea when he is climbing stairs or walking up an incline.  Never with exertional angina.  He reports a prior stress test many years ago that was unrevealing, I do not have this report for review at this time.  We discussed potential options to evaluate this further including invasive and noninvasive modalities along with the benefits/risks/limitations of each.  He has decided to defer further work-up at this time and plans to work on weight loss.  He will contact us if symptoms change.  3. Lower extremity swelling: Likely multifactorial including morbid obesity, venous insufficiency, and possible calcium channel blocker.  Patient prefers to remain on verapamil as above.  Recent echocardiogram demonstrated normal LV systolic function as well as diastolic function.  Recommend he elevate legs  and continue compression stockings.  He remains on torsemide 20 mg twice daily along with KCl repletion.  Check BMP.  4. GERD: Refill Protonix.  5. DM2: Patient has been referred to primary care.  Management per PCP.  6. Morbid obesity: Weight loss advised.   Disposition: F/u with me in 6 months.  Current medicines are reviewed at length with the patient today.  The patient did not have any concerns regarding medicines.  Signed, Christell Faith, PA-C 12/12/2017 3:46 PM     [place], [place] [address] [phone]

## 2017-12-12 NOTE — Patient Instructions (Signed)
Medication Instructions:  Refill sent in for Protonix  Labwork: BMET today and we will call you with results.    Follow-Up: Your physician wants you to follow-up in: 6 months Dr. Fletcher Anon or Christell Faith PA. You will receive a reminder letter in the mail two months in advance. If you don't receive a letter, please call our office to schedule the follow-up appointment.   Call [phone] to get help finding a primary care physician.

## 2017-12-13 ENCOUNTER — Telehealth: Payer: Self-pay | Admitting: Family Medicine

## 2017-12-13 LAB — BASIC METABOLIC PANEL
BUN/Creatinine Ratio: 13 (ref 9–20)
BUN: 13 mg/dL (ref 6–24)
CHLORIDE: 100 mmol/L (ref 96–106)
CO2: 24 mmol/L (ref 20–29)
CREATININE: 0.99 mg/dL (ref 0.76–1.27)
Calcium: 9 mg/dL (ref 8.7–10.2)
GFR calc Af Amer: 101 mL/min/{1.73_m2} (ref >59)
GFR calc non Af Amer: 87 mL/min/{1.73_m2} (ref >59)
GLUCOSE: 102 mg/dL — AB (ref 65–99)
POTASSIUM: 3.9 mmol/L (ref 3.5–5.2)
Sodium: 141 mmol/L (ref 134–144)

## 2017-12-13 NOTE — Telephone Encounter (Signed)
I received note from cardiologist Patient canceled appt with me in March Per cardiology note: "He still needs to establish with a PCP." Please clarify with patient is he is going elsewhere, and if so, please remove my name as primary so we don't continue to refill his medicines If he is planning on staying, he'll need an appointment very soon; last A1c was more than 6 months ago Thank you

## 2017-12-14 NOTE — Telephone Encounter (Signed)
Tried calling pt to verify if he is still using [place] as his PCP. Also to advise pt to schedule an appt. LVM for pt to call our office to let us know.

## 2017-12-21 ENCOUNTER — Encounter: Payer: Self-pay | Admitting: Family Medicine

## 2017-12-21 NOTE — Telephone Encounter (Signed)
Letter has been sent to patient in regards to scheduling a follow up appointment with our office.

## 2018-03-26 ENCOUNTER — Ambulatory Visit: Payer: BC Managed Care – PPO | Admitting: Family Medicine

## 2018-03-26 ENCOUNTER — Encounter: Payer: Self-pay | Admitting: Family Medicine

## 2018-03-26 VITALS — BP 138/82 | HR 58 | Temp 98.2°F | Ht 71.0 in | Wt >= 6400 oz

## 2018-03-26 DIAGNOSIS — I479 Paroxysmal tachycardia, unspecified: Secondary | ICD-10-CM

## 2018-03-26 DIAGNOSIS — E876 Hypokalemia: Secondary | ICD-10-CM | POA: Diagnosis not present

## 2018-03-26 DIAGNOSIS — M7989 Other specified soft tissue disorders: Secondary | ICD-10-CM

## 2018-03-26 DIAGNOSIS — M1A2720 Drug-induced chronic gout, left ankle and foot, without tophus (tophi): Secondary | ICD-10-CM

## 2018-03-26 DIAGNOSIS — T502X5A Adverse effect of carbonic-anhydrase inhibitors, benzothiadiazides and other diuretics, initial encounter: Secondary | ICD-10-CM

## 2018-03-26 DIAGNOSIS — E119 Type 2 diabetes mellitus without complications: Secondary | ICD-10-CM | POA: Diagnosis not present

## 2018-03-26 DIAGNOSIS — Z23 Encounter for immunization: Secondary | ICD-10-CM | POA: Diagnosis not present

## 2018-03-26 LAB — BASIC METABOLIC PANEL
BUN: 11 mg/dL (ref 6–23)
CALCIUM: 9.5 mg/dL (ref 8.4–10.5)
CO2: 31 mEq/L (ref 19–32)
Chloride: 100 mEq/L (ref 96–112)
Creatinine, Ser: 1.07 mg/dL (ref 0.40–1.50)
GFR: 76.81 mL/min (ref >60.00)
GLUCOSE: 117 mg/dL — AB (ref 70–99)
Potassium: 3.9 mEq/L (ref 3.5–5.1)
SODIUM: 140 meq/L (ref 135–145)

## 2018-03-26 LAB — HEPATIC FUNCTION PANEL
ALBUMIN: 4.2 g/dL (ref 3.5–5.2)
ALT: 23 U/L (ref 0–53)
AST: 19 U/L (ref 0–37)
Alkaline Phosphatase: 70 U/L (ref 39–117)
BILIRUBIN TOTAL: 0.5 mg/dL (ref 0.2–1.2)
Bilirubin, Direct: 0.1 mg/dL (ref 0.0–0.3)
Total Protein: 7.6 g/dL (ref 6.0–8.3)

## 2018-03-26 LAB — LIPID PANEL
Cholesterol: 172 mg/dL (ref 0–200)
HDL: 36.7 mg/dL — ABNORMAL LOW (ref >39.00)
LDL CALC: 109 mg/dL — AB (ref 0–99)
NonHDL: 135.11
Total CHOL/HDL Ratio: 5
Triglycerides: 132 mg/dL (ref 0.0–149.0)
VLDL: 26.4 mg/dL (ref 0.0–40.0)

## 2018-03-26 LAB — CBC
HEMATOCRIT: 43.8 % (ref 39.0–52.0)
Hemoglobin: 14.3 g/dL (ref 13.0–17.0)
MCHC: 32.8 g/dL (ref 30.0–36.0)
MCV: 80 fl (ref 78.0–100.0)
PLATELETS: 324 10*3/uL (ref 150.0–400.0)
RBC: 5.48 Mil/uL (ref 4.22–5.81)
RDW: 15.2 % (ref 11.5–15.5)
WBC: 8.2 10*3/uL (ref 4.0–10.5)

## 2018-03-26 LAB — MICROALBUMIN / CREATININE URINE RATIO
Creatinine,U: 108 mg/dL
MICROALB/CREAT RATIO: 0.6 mg/g (ref 0.0–30.0)
Microalb, Ur: 0.7 mg/dL (ref 0.0–1.9)

## 2018-03-26 LAB — HEMOGLOBIN A1C: Hgb A1c MFr Bld: 7.2 % — ABNORMAL HIGH (ref 4.6–6.5)

## 2018-03-26 MED ORDER — TORSEMIDE 20 MG PO TABS: 20.0000 mg | ORAL_TABLET | Freq: Two times a day (BID) | ORAL | 1 refills | Status: DC

## 2018-03-26 MED ORDER — METFORMIN HCL 500 MG PO TABS: 500.0000 mg | ORAL_TABLET | Freq: Every day | ORAL | 1 refills | Status: DC

## 2018-03-26 MED ORDER — ALLOPURINOL 100 MG PO TABS: 100.0000 mg | ORAL_TABLET | Freq: Two times a day (BID) | ORAL | 1 refills | Status: DC

## 2018-03-26 MED ORDER — VERAPAMIL HCL ER 180 MG PO TBCR: 180.0000 mg | EXTENDED_RELEASE_TABLET | Freq: Two times a day (BID) | ORAL | 1 refills | Status: DC

## 2018-03-26 MED ORDER — POTASSIUM CHLORIDE CRYS ER 20 MEQ PO TBCR: 20.0000 meq | EXTENDED_RELEASE_TABLET | Freq: Every day | ORAL | 1 refills | Status: DC

## 2018-03-26 NOTE — Progress Notes (Signed)
Subjective:    Patient ID: Michael Boyle, male    DOB: 1965-10-28, 52 y.o.   MRN: [id number]  HPI  Presents to clinic to establish care with new PCP. His previous PCP moved from the area.   He is a diabetic and his last A1c was 6.7%.  He takes metformin 500 mg daily.  Blood sugars at home have been running in the 110-125 range.  Patient has not had eye exam in many years, we will put an ophthalmology referral.  Patient takes verapamil for tachycardia treatment.  Uses torsemide to control leg swelling, also has potassium supplement due to history of hypokalemia related to diuretic use.  He uses allopurinol for gout prevention.  Per patient he has been stable and on all these medications for years.  He is willing to get new lab work today.  Colonoscopy UTD   Patient Active Problem List   Diagnosis Date Noted  . Diabetes mellitus without complication ([place]) 54/27/0623  . Genetic testing 12/07/2017  . Colon polyps   . Annual physical exam 03/24/2017  . Diabetes mellitus type 2, uncontrolled, without complications ([place]) 76/28/3151  . Hyperglycemia 09/23/2016  . Tachycardia, paroxysmal ([place]) 09/23/2015  . Diuretic-induced hypokalemia 09/23/2015  . Chronic thoracic back pain 09/23/2015  . Abnormal finding on EKG 09/23/2015  . COPD (chronic obstructive pulmonary disease) ([place]) 05/11/2015  . Acid reflux 05/11/2015  . Gout 05/11/2015  . Hypercholesterolemia 05/11/2015  . Morbid obesity ([place]) 05/11/2015  . Leg swelling 05/11/2015   Social History   Tobacco Use  . Smoking status: Former Smoker    Years: 5.00    Types: Cigarettes    Last attempt to quit: 06/13/2012    Years since quitting: 5.7  . Smokeless tobacco: Never Used  Substance Use Topics  . Alcohol use: No    Alcohol/week: 0.0 standard drinks   Review of Systems  Constitutional: Negative for chills, fatigue and fever.  HENT: Negative for congestion, ear pain, sinus pain and sore throat.   Eyes: Negative.     Respiratory: Negative for cough, shortness of breath and wheezing.   Cardiovascular: Negative for chest pain, palpitations and leg swelling.  Gastrointestinal: Negative for abdominal pain, diarrhea, nausea and vomiting.  Genitourinary: Negative for dysuria, frequency and urgency.  Musculoskeletal: Negative for arthralgias and myalgias.  Skin: Negative for color change, pallor and rash.  Neurological: Negative for syncope, light-headedness and headaches.  Psychiatric/Behavioral: The patient is not nervous/anxious.       Objective:   Physical Exam  Constitutional: He is oriented to person, place, and time. He appears well-developed and well-nourished. No distress.  Head: Normocephalic and atraumatic.  Eyes: Conjunctivae and EOM are normal. No scleral icterus.  Neck: Normal range of motion. Neck supple. No tracheal deviation present.  Cardiovascular: Normal rate, regular rhythm and normal heart sounds. No LE edema today. Pulmonary/Chest: Effort normal and breath sounds normal. No respiratory distress. He has no wheezes. He has no rales.  Abdominal: Obese ABD. Soft. Bowel sounds are normal. There is no tenderness.  Neurological: He is alert and oriented to person, place, and time. Gait normal.  Skin: Skin is warm and dry. He is not diaphoretic. No pallor.  Psychiatric: He has a normal mood and affect. His behavior is normal. Thought content normal.   Nursing note and vitals reviewed.  Vitals:   03/26/18 0930  BP: 138/82  Pulse: (!) 58  Temp: 98.2 F (36.8 C)  SpO2: 94%      Assessment & Plan:  Type 2 DM -patient has been stable on metformin 500 mg daily.  We will continue this dose.  New A1c ordered today.  Blood sugars have been stable at home per patient report, advised to continue monitoring blood sugars 1-2 times a day.  Tachycardia-continue verapamil 180 mg twice daily  Lower extremity swelling-continue torsemide 20 mg twice daily, patient has been stable on this dose  for many years.  Diuretic induced hypokalemia- we will continue to monitor electrolytes with lab work.  Need for Pneumovax- Pneumovax 23 given in clinic today  Gout - Stable on allopurinol  Follow-up in 3 months for management of chronic conditions.  He may return to clinic sooner if needed.

## 2018-03-26 NOTE — Patient Instructions (Signed)
Great to meet you! 

## 2018-06-26 ENCOUNTER — Ambulatory Visit: Payer: BC Managed Care – PPO | Admitting: Family Medicine

## 2018-06-26 ENCOUNTER — Encounter: Payer: Self-pay | Admitting: Family Medicine

## 2018-06-26 VITALS — BP 132/82 | HR 64 | Temp 97.7°F | Ht 71.0 in | Wt >= 6400 oz

## 2018-06-26 DIAGNOSIS — I479 Paroxysmal tachycardia, unspecified: Secondary | ICD-10-CM | POA: Diagnosis not present

## 2018-06-26 DIAGNOSIS — Z23 Encounter for immunization: Secondary | ICD-10-CM

## 2018-06-26 DIAGNOSIS — E119 Type 2 diabetes mellitus without complications: Secondary | ICD-10-CM

## 2018-06-26 DIAGNOSIS — M7989 Other specified soft tissue disorders: Secondary | ICD-10-CM

## 2018-06-26 DIAGNOSIS — K219 Gastro-esophageal reflux disease without esophagitis: Secondary | ICD-10-CM

## 2018-06-26 DIAGNOSIS — J449 Chronic obstructive pulmonary disease, unspecified: Secondary | ICD-10-CM

## 2018-06-26 LAB — COMPREHENSIVE METABOLIC PANEL
ALK PHOS: 65 U/L (ref 39–117)
ALT: 36 U/L (ref 0–53)
AST: 30 U/L (ref 0–37)
Albumin: 4.5 g/dL (ref 3.5–5.2)
BUN: 14 mg/dL (ref 6–23)
CALCIUM: 9.4 mg/dL (ref 8.4–10.5)
CO2: 31 mEq/L (ref 19–32)
Chloride: 98 mEq/L (ref 96–112)
Creatinine, Ser: 0.98 mg/dL (ref 0.40–1.50)
GFR: 84.93 mL/min (ref >60.00)
GLUCOSE: 126 mg/dL — AB (ref 70–99)
POTASSIUM: 3.7 meq/L (ref 3.5–5.1)
Sodium: 138 mEq/L (ref 135–145)
TOTAL PROTEIN: 7.9 g/dL (ref 6.0–8.3)
Total Bilirubin: 0.6 mg/dL (ref 0.2–1.2)

## 2018-06-26 LAB — HEMOGLOBIN A1C: HEMOGLOBIN A1C: 6.9 % — AB (ref 4.6–6.5)

## 2018-06-26 NOTE — Progress Notes (Signed)
Subjective:    Patient ID: Michael Boyle, male    DOB: 02/15/1966, 52 y.o.   MRN: [id number]  HPI  Presents to clinic for 3 month follow up on DM, COPD, Reflux, tachycardia and edema.  Blood sugars have been stable.  They have been running in the 110-130 range.  Patient states he has been working hard on his diet, eating less calories, cutting back on the soft drinks.  Patient states by doing this his blood sugar numbers have greatly improved.  He was advised after last lab work to increase his metformin from 500 mg twice daily 2000 mg twice daily, but he has remained on 500 mg grams twice daily while working on diet.  Blood pressure is very good today.  He has had no issues with racing heart.  Denies any lower extremity swelling, tolerating torsemide well.  Breathing is stable, has had no episodes of shortness of breath or wheezing.  Tolerating Advair discus without any problems.  Protonix 40 mg once daily works well to keep reflux symptoms under control.  Also notices he has had less issues with heartburn since improving his diet choices.   Patient Active Problem List   Diagnosis Date Noted  . Diabetes mellitus without complication ([place]) 09/47/0962  . Genetic testing 12/07/2017  . Colon polyps   . Annual physical exam 03/24/2017  . Diabetes mellitus type 2, uncontrolled, without complications ([place]) 83/66/2947  . Hyperglycemia 09/23/2016  . Tachycardia, paroxysmal ([place]) 09/23/2015  . Diuretic-induced hypokalemia 09/23/2015  . Chronic thoracic back pain 09/23/2015  . Abnormal finding on EKG 09/23/2015  . COPD (chronic obstructive pulmonary disease) ([place]) 05/11/2015  . Acid reflux 05/11/2015  . Gout 05/11/2015  . Hypercholesterolemia 05/11/2015  . Morbid obesity ([place]) 05/11/2015  . Leg swelling 05/11/2015   Social History   Tobacco Use  . Smoking status: Former Smoker    Years: 5.00    Types: Cigarettes    Last attempt to quit: 06/13/2012    Years since quitting: 6.0  .  Smokeless tobacco: Never Used  Substance Use Topics  . Alcohol use: No    Alcohol/week: 0.0 standard drinks     Review of Systems  Constitutional: Negative for chills, fatigue and fever.  HENT: Negative for congestion, ear pain, sinus pain and sore throat.   Eyes: Negative.   Respiratory: Negative for cough, shortness of breath and wheezing.   Cardiovascular: Negative for chest pain, palpitations and leg swelling.  Gastrointestinal: Negative for abdominal pain, diarrhea, nausea and vomiting.  Genitourinary: Negative for dysuria, frequency and urgency.  Musculoskeletal: Negative for arthralgias and myalgias.  Skin: Negative for color change, pallor and rash.  Neurological: Negative for syncope, light-headedness and headaches.  Psychiatric/Behavioral: The patient is not nervous/anxious.       Objective:   Physical Exam  Constitutional: He is oriented to person, place, and time. No distress.  HENT:  Head: Normocephalic and atraumatic.  Eyes: Conjunctivae and EOM are normal. No scleral icterus.  Neck: Neck supple. No tracheal deviation present.  Cardiovascular: Normal rate and regular rhythm.  Pulmonary/Chest: Effort normal and breath sounds normal. No respiratory distress.  Musculoskeletal: He exhibits no edema.  Gait normal  Neurological: He is alert and oriented to person, place, and time.  Skin: Skin is warm and dry. He is not diaphoretic.  Psychiatric: He has a normal mood and affect. His behavior is normal.  Vitals reviewed.  Wt Readings from Last 3 Encounters:  06/26/18 (!) 401 lb 9.6 oz (182.2 kg)  03/26/18 (!) 409 lb 9.6 oz (185.8 kg)  12/12/17 (!) 406 lb 8 oz (184.4 kg)      Vitals:   06/26/18 0814  BP: 132/82  Pulse: 64  Temp: 97.7 F (36.5 C)  SpO2: 93%   Assessment & Plan:   Type 2 diabetes - patient will continue metformin 500 mg twice daily.  We will get new A1c today, if it remains not improved from last we will bump up to metformin to 1 thousand  milligrams twice daily.  Paroxysmal tachycardia-heart rate well controlled on current medications.  Blood pressure remained stable on these medications as well.  COPD-breathing stable on Advair Diskus.  GERD-stable on Protonix.  Morbid obesity- encourage patient to continue working on diet and exercise, he has already lost about 9 pounds since last visit.  Commended him for watching what he is eating and working towards improving his health.  Flu vaccine given in clinic today.  We will check CMP and A1c today.  Patient will return to clinic in approximately 6 months for follow-up on chronic medical conditions.  Patient aware he can return to clinic sooner if any issues arise.

## 2018-09-25 ENCOUNTER — Other Ambulatory Visit: Payer: Self-pay | Admitting: Family Medicine

## 2018-09-25 DIAGNOSIS — M7989 Other specified soft tissue disorders: Secondary | ICD-10-CM

## 2018-09-25 DIAGNOSIS — I479 Paroxysmal tachycardia, unspecified: Secondary | ICD-10-CM

## 2018-09-25 DIAGNOSIS — E876 Hypokalemia: Secondary | ICD-10-CM

## 2018-09-25 DIAGNOSIS — M1A2720 Drug-induced chronic gout, left ankle and foot, without tophus (tophi): Secondary | ICD-10-CM

## 2018-09-25 DIAGNOSIS — T502X5A Adverse effect of carbonic-anhydrase inhibitors, benzothiadiazides and other diuretics, initial encounter: Secondary | ICD-10-CM

## 2018-09-26 ENCOUNTER — Other Ambulatory Visit: Payer: Self-pay | Admitting: Lab

## 2018-09-26 DIAGNOSIS — T502X5A Adverse effect of carbonic-anhydrase inhibitors, benzothiadiazides and other diuretics, initial encounter: Secondary | ICD-10-CM

## 2018-09-26 DIAGNOSIS — M1A2720 Drug-induced chronic gout, left ankle and foot, without tophus (tophi): Secondary | ICD-10-CM

## 2018-09-26 DIAGNOSIS — M7989 Other specified soft tissue disorders: Secondary | ICD-10-CM

## 2018-09-26 DIAGNOSIS — E876 Hypokalemia: Secondary | ICD-10-CM

## 2018-09-26 DIAGNOSIS — I479 Paroxysmal tachycardia, unspecified: Secondary | ICD-10-CM

## 2018-09-26 MED ORDER — ALLOPURINOL 100 MG PO TABS: 100.0000 mg | ORAL_TABLET | Freq: Two times a day (BID) | ORAL | 1 refills | Status: DC

## 2018-09-26 MED ORDER — TORSEMIDE 10 MG PO TABS: ORAL_TABLET | ORAL | 1 refills | Status: DC

## 2018-09-26 MED ORDER — POTASSIUM CHLORIDE CRYS ER 20 MEQ PO TBCR: 20.0000 meq | EXTENDED_RELEASE_TABLET | Freq: Every day | ORAL | 1 refills | Status: DC

## 2018-09-26 MED ORDER — VERAPAMIL HCL ER 180 MG PO TBCR: 180.0000 mg | EXTENDED_RELEASE_TABLET | Freq: Two times a day (BID) | ORAL | 1 refills | Status: DC

## 2018-10-01 ENCOUNTER — Other Ambulatory Visit: Payer: Self-pay | Admitting: Family Medicine

## 2018-10-01 DIAGNOSIS — E119 Type 2 diabetes mellitus without complications: Secondary | ICD-10-CM

## 2018-10-02 ENCOUNTER — Other Ambulatory Visit: Payer: Self-pay | Admitting: Lab

## 2018-10-02 DIAGNOSIS — E119 Type 2 diabetes mellitus without complications: Secondary | ICD-10-CM

## 2018-10-02 MED ORDER — METFORMIN HCL 500 MG PO TABS: 500.0000 mg | ORAL_TABLET | Freq: Every day | ORAL | 0 refills | Status: DC

## 2018-12-03 ENCOUNTER — Ambulatory Visit (INDEPENDENT_AMBULATORY_CARE_PROVIDER_SITE_OTHER): Payer: BC Managed Care – PPO | Admitting: Family Medicine

## 2018-12-03 ENCOUNTER — Encounter: Payer: Self-pay | Admitting: Family Medicine

## 2018-12-03 ENCOUNTER — Ambulatory Visit: Payer: Self-pay | Admitting: *Deleted

## 2018-12-03 ENCOUNTER — Other Ambulatory Visit: Payer: Self-pay

## 2018-12-03 ENCOUNTER — Ambulatory Visit
Admission: RE | Admit: 2018-12-03 | Discharge: 2018-12-03 | Disposition: A | Discharge status: Home / Self Care | Payer: BC Managed Care – PPO | Source: Ambulatory Visit | Attending: Family Medicine | Admitting: Family Medicine

## 2018-12-03 ENCOUNTER — Ambulatory Visit: Payer: Self-pay

## 2018-12-03 DIAGNOSIS — R600 Localized edema: Secondary | ICD-10-CM | POA: Diagnosis not present

## 2018-12-03 DIAGNOSIS — M79605 Pain in left leg: Secondary | ICD-10-CM

## 2018-12-03 DIAGNOSIS — J302 Other seasonal allergic rhinitis: Secondary | ICD-10-CM

## 2018-12-03 MED ORDER — ALBUTEROL SULFATE HFA 108 (90 BASE) MCG/ACT IN AERS: 2.0000 | INHALATION_SPRAY | Freq: Four times a day (QID) | RESPIRATORY_TRACT | 2 refills | Status: DC | PRN

## 2018-12-03 NOTE — Telephone Encounter (Signed)
Left leg swelling and starting hurting last week, feels like it is swollen behind the knee No  Redness or lumps noted.  Shortness of breath started on last thursday or Friday. Denies noting any bites on his leg. No fever. No chest pain. Has some numbness in his foot at times. Per protocol, pt advised to go to an urgent care to r/o blood clot. Pt voiced understanding and will go to [facility] and will call before going there. Routing to flow at [facility] for review.  Reason for Disposition . [1] Thigh, calf, or ankle swelling AND [2] only 1 side  Answer Assessment - Initial Assessment Questions 1. ONSET: "When did the swelling start?" (e.g., minutes, hours, days)     Last week 2. LOCATION: "What part of the leg is swollen?"  "Are both legs swollen or just one leg?"     right inside of leg going to the back, at the bend of the knee 3. SEVERITY: "How bad is the swelling?" (e.g., localized; mild, moderate, severe)  - Localized - small area of swelling localized to one leg  - MILD pedal edema - swelling limited to foot and ankle, pitting edema < 1/4 inch (6 mm) deep, rest and elevation eliminate most or all swelling  - MODERATE edema - swelling of lower leg to knee, pitting edema > 1/4 inch (6 mm) deep, rest and elevation only partially reduce swelling  - SEVERE edema - swelling extends above knee, facial or hand swelling present      Mild swelling above the ankle 4. REDNESS: "Does the swelling look red or infected?"     no 5. PAIN: "Is the swelling painful to touch?" If so, ask: "How painful is it?"   (Scale 1-10; mild, moderate or severe)     Tender when crossing leg, touching it 6. FEVER: "Do you have a fever?" If so, ask: "What is it, how was it measured, and when did it start?"      no 7. CAUSE: "What do you think is causing the leg swelling?"     Not sure 8. MEDICAL HISTORY: "Do you have a history of heart failure, kidney disease, liver failure, or cancer?"     Heart  issues, tachy 9. RECURRENT SYMPTOM: "Have you had leg swelling before?" If so, ask: "When was the last time?" "What happened that time?"     Yes but not for this long 10. OTHER SYMPTOMS: "Do you have any other symptoms?" (e.g., chest pain, difficulty breathing)       Some shortness of breath 11. PREGNANCY: "Is there any chance you are pregnant?" "When was your last menstrual period?"       n/a  Protocols used: LEG SWELLING AND EDEMA-A-AH

## 2018-12-03 NOTE — Progress Notes (Signed)
Patient ID: Michael Boyle, male   DOB: 1965/08/13, 53 y.o.   MRN: [id number]    Virtual Visit via video Note  This visit type was conducted due to national recommendations for restrictions regarding the COVID-19 pandemic (e.g. social distancing).  This format is felt to be most appropriate for this patient at this time.  All issues noted in this document were discussed and addressed.  No physical exam was performed (except for noted visual exam findings with Video Visits).   I connected with Michael Boyle on 12/03/18 at  1:00 PM EDT by a video enabled telemedicine application and verified that I am speaking with the correct person using two identifiers. Location patient: home Location provider: [facility] Persons participating in the virtual visit: patient, provider  I discussed the limitations, risks, security and privacy concerns of performing an evaluation and management service by video and the availability of in person appointments. I also discussed with the patient that there may be a patient responsible charge related to this service. The patient expressed understanding and agreed to proceed.   HPI:  Patient and I connected via video today due to complaints of increased left lower extremity leg swelling and left leg pain.  Patient has a chronic issue with lower extremity swelling, takes torsemide 20 mg twice daily every day due to this.  States the left leg is a little bit more swollen than the right today.  Also has some tenderness behind left knee and slightly above left knee.  No redness or heat of skin.  Has no chest pain.  No fever or chills.  Denies wheezing or shortness of breath.  Does get a little shortness of breath at times on occasion during the spring related to pollen, but currently is not having any issues like that.  Denies GI or GU issues.   ROS: See pertinent positives and negatives per HPI.  Past Medical History:  Diagnosis Date  . Colon polyps   . COPD  (chronic obstructive pulmonary disease) ([place])   . Diabetes mellitus with complication ([place])   . Genetic testing 12/07/2017   Common Cancers panel (47 genes) @ [facility] - No pathogenic mutations detected  . GERD (gastroesophageal reflux disease)   . Gout   . Incomplete left bundle branch block   . Incomplete left bundle branch block (LBBB)    a. TTE 4/17: EF of 60-65%, unable to assess wall motion, normal LV diastolic function parameters, mildly calcifief mitral annulus  . Morbid obesity ([place])   . Palpitations    a. felt to be pSVT  . Palpitations   . Venous insufficiency     Past Surgical History:  Procedure Laterality Date  . COLONOSCOPY WITH PROPOFOL N/A 08/14/2015   Procedure: COLONOSCOPY WITH PROPOFOL;  Surgeon: Lollie Sails, MD;  Location: [facility] ENDOSCOPY;  Service: Endoscopy;  Laterality: N/A;  . COLONOSCOPY WITH PROPOFOL N/A 11/02/2017   Procedure: COLONOSCOPY WITH PROPOFOL;  Surgeon: Lollie Sails, MD;  Location: [facility] ENDOSCOPY;  Service: Endoscopy;  Laterality: N/A;  . ESOPHAGOGASTRODUODENOSCOPY (EGD) WITH PROPOFOL N/A 08/14/2015   Procedure: ESOPHAGOGASTRODUODENOSCOPY (EGD) WITH PROPOFOL;  Surgeon: Lollie Sails, MD;  Location: [facility] ENDOSCOPY;  Service: Endoscopy;  Laterality: N/A;  . WISDOM TOOTH EXTRACTION      Family History  Problem Relation Age of Onset  . Cancer Sister        lymphoma dx 53s; currently 43  . Diabetes Sister   . Cirrhosis Sister   . Colon cancer Paternal Uncle  dx 43s; deceased 43  . Prostate cancer Paternal Uncle        dx 35s; currently 24   Social History   Tobacco Use  . Smoking status: Former Smoker    Years: 5.00    Types: Cigarettes    Last attempt to quit: 06/13/2012    Years since quitting: 6.4  . Smokeless tobacco: Never Used  Substance Use Topics  . Alcohol use: No    Alcohol/week: 0.0 standard drinks      Current Outpatient Medications:  .  allopurinol (ZYLOPRIM) 100 MG tablet, Take 1 tablet (100 mg total)  by mouth 2 (two) times daily., Disp: 180 tablet, Rfl: 1 .  aspirin EC 81 MG tablet, Take 1 tablet (81 mg total) by mouth daily., Disp: 90 tablet, Rfl: 3 .  Fluticasone-Salmeterol (ADVAIR DISKUS) 500-50 MCG/DOSE AEPB, Inhale 1 puff into the lungs 2 (two) times daily., Disp: 60 each, Rfl: 5 .  metFORMIN (GLUCOPHAGE) 500 MG tablet, Take 1 tablet (500 mg total) by mouth daily before supper., Disp: 90 tablet, Rfl: 0 .  pantoprazole (PROTONIX) 40 MG tablet, Take 1 tablet (40 mg total) by mouth daily., Disp: 90 tablet, Rfl: 3 .  potassium chloride SA (K-DUR,KLOR-CON) 20 MEQ tablet, Take 1 tablet (20 mEq total) by mouth daily., Disp: 90 tablet, Rfl: 1 .  torsemide (DEMADEX) 10 MG tablet, Take 2 TABLETS (20 mg total) by mouth 2 (two) times daily., Disp: 360 tablet, Rfl: 1 .  verapamil (CALAN-SR) 180 MG CR tablet, Take 1 tablet (180 mg total) by mouth 2 (two) times daily., Disp: 180 tablet, Rfl: 1 .  albuterol (VENTOLIN HFA) 108 (90 Base) MCG/ACT inhaler, Inhale 2 puffs into the lungs every 6 (six) hours as needed for wheezing or shortness of breath., Disp: 1 Inhaler, Rfl: 2  EXAM:  GENERAL: alert, oriented, appears well and in no acute distress  HEENT: atraumatic, conjunttiva clear, no obvious abnormalities on inspection of external nose and ears  NECK: normal movements of the head and neck  LUNGS: on inspection no signs of respiratory distress, breathing rate appears normal, no obvious gross SOB, gasping or wheezing  CV: no obvious cyanosis  MS: moves all visible extremities without noticeable abnormality  Lower extremity: Patient able to put camera view so I can see both of his legs.  No obvious redness seen over video feed, legs do both appear somewhat swollen, left slightly more than right.  When patient presses into left lower extremity does become a indent of his fingers.  Capillary refill of lower extremities appears to be less than 2 seconds.  PSYCH/NEURO: pleasant and cooperative, no  obvious depression or anxiety, speech and thought processing grossly intact  ASSESSMENT AND PLAN:  Discussed the following assessment and plan:  Lower extremity edema - Plan: US Venous Img Lower Unilateral Left  Left leg pain - Plan: US Venous Img Lower Unilateral Left  Morbid obesity (HCC)  Seasonal allergies - Plan: albuterol (VENTOLIN HFA) 108 (90 Base) MCG/ACT inhaler  Due to patient's past medical history including chronic lower extremity edema, diabetes, morbid obesity I feel it is necessary for [place] to rule out DVT.  Patient will get Doppler of left lower extremity done today.  Once we have results of this Doppler if the DVT is present we will treat accordingly.  Due to lower extremity swelling, we will have patient increase torsemide to an additional 10 mg dose at lunchtime for 3 days.  Also advised to elevate legs whenever able to move  to help reduce swelling.   Patient also requested a refill of Ventolin inhaler, uses this on occasion if needed for any shortness of breath.  Will get shortness of breath often after going for a walk outdoors when pollen counts are high.  I discussed the assessment and treatment plan with the patient. The patient was provided an opportunity to ask questions and all were answered. The patient agreed with the plan and demonstrated an understanding of the instructions.   The patient was advised to call back or seek an in-person evaluation if the symptoms worsen or if the condition fails to improve as anticipated.  Once we have Doppler results, we will better be able to decide next step in plan of care.  We were able to get patient set up for a Doppler this afternoon and he is aware of Doppler appointment details.  Jodelle Green, FNP

## 2018-12-03 NOTE — Telephone Encounter (Signed)
We can do a doxy for patient and get stat doppler set up for him  Is he willing to do doxy?  If not then he can come here if he has not cough or fever to be seen in person  Or if he wants he can go to [facility]

## 2018-12-03 NOTE — Telephone Encounter (Signed)
Incoming call from Marinus Maw  who reUS Venous Img Lower Unilateral Left (Accession [id number]) (Order [id number])  Imaging  Date: 12/03/2018 Department: [facility] Released By: Jennet Maduro Authorizing: Jodelle Green, FNP  Exam Information   Status Exam Begun  Exam Ended   Final [99] 12/03/2018 3:44 PM 12/03/2018 3:45 PM  PACS Images   Show images for US Venous Img Lower Unilateral Left  Study Result   CLINICAL DATA:  53 year old male with left lower extremity pain and swelling for the past 1.5 weeks  EXAM: LEFT LOWER EXTREMITY VENOUS DOPPLER ULTRASOUND  TECHNIQUE: Gray-scale sonography with graded compression, as well as color Doppler and duplex ultrasound were performed to evaluate the lower extremity deep venous systems from the level of the common femoral vein and including the common femoral, femoral, profunda femoral, popliteal and calf veins including the posterior tibial, peroneal and gastrocnemius veins when visible. The superficial great saphenous vein was also interrogated. Spectral Doppler was utilized to evaluate flow at rest and with distal augmentation maneuvers in the common femoral, femoral and popliteal veins.  COMPARISON:  None.  FINDINGS: Contralateral Common Femoral Vein: Respiratory phasicity is normal and symmetric with the symptomatic side. No evidence of thrombus. Normal compressibility.  Common Femoral Vein: No evidence of thrombus. Normal compressibility, respiratory phasicity and response to augmentation.  Saphenofemoral Junction: No evidence of thrombus. Normal compressibility and flow on color Doppler imaging.  Profunda Femoral Vein: No evidence of thrombus. Normal compressibility and flow on color Doppler imaging.  Femoral Vein: No evidence of thrombus. Normal compressibility, respiratory phasicity and response to augmentation.  Popliteal Vein: No evidence of thrombus. Normal  compressibility, respiratory phasicity and response to augmentation.  Calf Veins: No evidence of thrombus. Normal compressibility and flow on color Doppler imaging.  Superficial Great Saphenous Vein: No evidence of thrombus. Normal compressibility.  Venous Reflux:  None.  Other Findings:  None.  IMPRESSION: No evidence of deep venous thrombosis.   Electronically Signed   By: Jacqulynn Cadet M.D.   On: 12/03/2018 15:54   Result History   US Venous Img Lower Unilateral Left (Order #[id number]) on 12/03/2018 - Order Result History Report  Encounter-Level Documents - 12/03/2018:   Electronic signature on 12/03/2018 2:54 PM - E-signed  Scan on 12/03/2018 3:47 PM by Default, Provider, MD      Order-Level Documents:   There are no order-level documents.  Hospital account-Level Documents:   There are no hospital account-level documents.  Vitals   Height Weight BMI (Calculated)      Imaging   Imaging Information  Resulted by:   Signed Date/Time  Phone Pager  Jacqulynn Cadet 12/03/2018 3:54 PM [phone]   Study Notes    Ranelle Oyster, RT on 12/03/2018 4:08 PM  Report called to and read back by Roselie Awkward at 4:08pm     Original Order   Ordered On Ordered By   12/03/2018 1:14 PM Jodelle Green, FNP           External Result Report   External Result Report  US Venous Img Lower Unilateral Left (Accession [id number]) (Order [id number])  Imaging  Date: 12/03/2018 Department: [facility] Released By: Jennet Maduro Authorizing: Jodelle Green, FNP  Exam Information   Status Exam Begun  Exam Ended   Final [99] 12/03/2018 3:44 PM 12/03/2018 3:45 PM  PACS Images   Show images for US Venous Img Lower Unilateral Left  Study Result   CLINICAL DATA:  53 year old male  with left lower extremity pain and swelling for the past 1.5 weeks  EXAM: LEFT LOWER EXTREMITY VENOUS DOPPLER ULTRASOUND  TECHNIQUE: Gray-scale  sonography with graded compression, as well as color Doppler and duplex ultrasound were performed to evaluate the lower extremity deep venous systems from the level of the common femoral vein and including the common femoral, femoral, profunda femoral, popliteal and calf veins including the posterior tibial, peroneal and gastrocnemius veins when visible. The superficial great saphenous vein was also interrogated. Spectral Doppler was utilized to evaluate flow at rest and with distal augmentation maneuvers in the common femoral, femoral and popliteal veins.  COMPARISON:  None.  FINDINGS: Contralateral Common Femoral Vein: Respiratory phasicity is normal and symmetric with the symptomatic side. No evidence of thrombus. Normal compressibility.  Common Femoral Vein: No evidence of thrombus. Normal compressibility, respiratory phasicity and response to augmentation.  Saphenofemoral Junction: No evidence of thrombus. Normal compressibility and flow on color Doppler imaging.  Profunda Femoral Vein: No evidence of thrombus. Normal compressibility and flow on color Doppler imaging.  Femoral Vein: No evidence of thrombus. Normal compressibility, respiratory phasicity and response to augmentation.  Popliteal Vein: No evidence of thrombus. Normal compressibility, respiratory phasicity and response to augmentation.  Calf Veins: No evidence of thrombus. Normal compressibility and flow on color Doppler imaging.  Superficial Great Saphenous Vein: No evidence of thrombus. Normal compressibility.  Venous Reflux:  None.  Other Findings:  None.  IMPRESSION: No evidence of deep venous thrombosis.   Electronically Signed   By: Jacqulynn Cadet M.D.   On: 12/03/2018 15:54   Result History   US Venous Img Lower Unilateral Left (Order #[id number]) on 12/03/2018 - Order Result History Report  Encounter-Level Documents - 12/03/2018:   Electronic signature on 12/03/2018 2:54  PM - E-signed  Scan on 12/03/2018 3:47 PM by Default, Provider, MD      Order-Level Documents:   There are no order-level documents.  Hospital account-Level Documents:   There are no hospital account-level documents.  Vitals   Height Weight BMI (Calculated)      Imaging   Imaging Information  Resulted by:   Signed Date/Time  Phone Pager  Jacqulynn Cadet 12/03/2018 3:54 PM [phone]   Study Notes    Ranelle Oyster, RT on 12/03/2018 4:08 PM  Report called to and read back by Roselie Awkward at 4:08pm     Original Order   Ordered On Ordered By   12/03/2018 1:14 PM Jodelle Green, FNP           External Result Report   External Result Report   US Venous Img Lower Unilateral Left (Accession [id number]) (Order [id number])  Imaging  Date: 12/03/2018 Department: [facility] Released By: Jennet Maduro Authorizing: Jodelle Green, FNP  Exam Information   Status Exam Begun  Exam Ended   Final [99] 12/03/2018 3:44 PM 12/03/2018 3:45 PM  PACS Images   Show images for US Venous Img Lower Unilateral Left  Study Result   CLINICAL DATA:  53 year old male with left lower extremity pain and swelling for the past 1.5 weeks  EXAM: LEFT LOWER EXTREMITY VENOUS DOPPLER ULTRASOUND  TECHNIQUE: Gray-scale sonography with graded compression, as well as color Doppler and duplex ultrasound were performed to evaluate the lower extremity deep venous systems from the level of the common femoral vein and including the common femoral, femoral, profunda femoral, popliteal and calf veins including the posterior tibial, peroneal and gastrocnemius veins when visible. The superficial great saphenous vein  was also interrogated. Spectral Doppler was utilized to evaluate flow at rest and with distal augmentation maneuvers in the common femoral, femoral and popliteal veins.  COMPARISON:  None.  FINDINGS: Contralateral Common Femoral Vein: Respiratory  phasicity is normal and symmetric with the symptomatic side. No evidence of thrombus. Normal compressibility.  Common Femoral Vein: No evidence of thrombus. Normal compressibility, respiratory phasicity and response to augmentation.  Saphenofemoral Junction: No evidence of thrombus. Normal compressibility and flow on color Doppler imaging.  Profunda Femoral Vein: No evidence of thrombus. Normal compressibility and flow on color Doppler imaging.  Femoral Vein: No evidence of thrombus. Normal compressibility, respiratory phasicity and response to augmentation.  Popliteal Vein: No evidence of thrombus. Normal compressibility, respiratory phasicity and response to augmentation.  Calf Veins: No evidence of thrombus. Normal compressibility and flow on color Doppler imaging.  Superficial Great Saphenous Vein: No evidence of thrombus. Normal compressibility.  Venous Reflux:  None.  Other Findings:  None.  IMPRESSION: No evidence of deep venous thrombosis.   Electronically Signed   By: Jacqulynn Cadet M.D.   On: 12/03/2018 15:54   Result History   US Venous Img Lower Unilateral Left (Order #[id number]) on 12/03/2018 - Order Result History Report  Encounter-Level Documents - 12/03/2018:   Electronic signature on 12/03/2018 2:54 PM - E-signed  Scan on 12/03/2018 3:47 PM by Default, Provider, MD      Order-Level Documents:   There are no order-level documents.  Hospital account-Level Documents:   There are no hospital account-level documents.  Vitals   Height Weight BMI (Calculated)      Imaging   Imaging Information  Resulted by:   Signed Date/Time  Phone Pager  Jacqulynn Cadet 12/03/2018 3:54 PM [phone]   Study Notes    Ranelle Oyster, RT on 12/03/2018 4:08 PM  Report called to and read back by Roselie Awkward at 4:08pm     Original Order   Ordered On Ordered By   12/03/2018 1:14 PM Jodelle Green, FNP           External Result  Report   External Result Report   US Venous Img Lower Unilateral Left (Accession [id number]) (Order [id number])  Imaging  Date: 12/03/2018 Department: [facility] Released By: Jennet Maduro Authorizing: Jodelle Green, FNP  Exam Information   Status Exam Begun  Exam Ended   Final [99] 12/03/2018 3:44 PM 12/03/2018 3:45 PM  PACS Images   Show images for US Venous Img Lower Unilateral Left  Study Result   CLINICAL DATA:  53 year old male with left lower extremity pain and swelling for the past 1.5 weeks  EXAM: LEFT LOWER EXTREMITY VENOUS DOPPLER ULTRASOUND  TECHNIQUE: Gray-scale sonography with graded compression, as well as color Doppler and duplex ultrasound were performed to evaluate the lower extremity deep venous systems from the level of the common femoral vein and including the common femoral, femoral, profunda femoral, popliteal and calf veins including the posterior tibial, peroneal and gastrocnemius veins when visible. The superficial great saphenous vein was also interrogated. Spectral Doppler was utilized to evaluate flow at rest and with distal augmentation maneuvers in the common femoral, femoral and popliteal veins.  COMPARISON:  None.  FINDINGS: Contralateral Common Femoral Vein: Respiratory phasicity is normal and symmetric with the symptomatic side. No evidence of thrombus. Normal compressibility.  Common Femoral Vein: No evidence of thrombus. Normal compressibility, respiratory phasicity and response to augmentation.  Saphenofemoral Junction: No evidence of thrombus. Normal compressibility and flow on color  Doppler imaging.  Profunda Femoral Vein: No evidence of thrombus. Normal compressibility and flow on color Doppler imaging.  Femoral Vein: No evidence of thrombus. Normal compressibility, respiratory phasicity and response to augmentation.  Popliteal Vein: No evidence of thrombus. Normal  compressibility, respiratory phasicity and response to augmentation.  Calf Veins: No evidence of thrombus. Normal compressibility and flow on color Doppler imaging.  Superficial Great Saphenous Vein: No evidence of thrombus. Normal compressibility.  Venous Reflux:  None.  Other Findings:  None.  IMPRESSION: No evidence of deep venous thrombosis.   Electronically Signed   By: Jacqulynn Cadet M.D.   On: 12/03/2018 15:54   Result History   US Venous Img Lower Unilateral Left (Order #[id number]) on 12/03/2018 - Order Result History Report  Encounter-Level Documents - 12/03/2018:   Electronic signature on 12/03/2018 2:54 PM - E-signed  Scan on 12/03/2018 3:47 PM by Default, Provider, MD      Order-Level Documents:   There are no order-level documents.  Hospital account-Level Documents:   There are no hospital account-level documents.  Vitals   Height Weight BMI (Calculated)      Imaging   Imaging Information  Resulted by:   Signed Date/Time  Phone Pager  Jacqulynn Cadet 12/03/2018 3:54 PM [phone]   Study Notes    Ranelle Oyster, RT on 12/03/2018 4:08 PM  Report called to and read back by Roselie Awkward at 4:08pm     Original Order   Ordered On Ordered By   12/03/2018 1:14 PM Guse, Jacquelynn Cree, FNP           External Result Report   External Result Report   Marinus Maw  Reports that Patient is negative for DVT.

## 2018-12-03 NOTE — Telephone Encounter (Signed)
Here is that phone triage

## 2018-12-03 NOTE — Telephone Encounter (Signed)
Pt called [facility] Left leg swelling and starting hurting last week, feels like it is swollen behind the knee No  Redness or lumps noted.  Shortness of breath started on last thursday or Friday. Denies noting any bites on his leg. No fever. No chest pain. Has some numbness in his foot at times. Per protocol, pt advised to go to an urgent care to r/o blood clot. Pt voiced understanding and will go to [facility] and will call before going there. Routing to flow at [facility] for review.

## 2018-12-03 NOTE — Telephone Encounter (Signed)
Pt has a 1:00pm Doxy visit today

## 2018-12-26 ENCOUNTER — Ambulatory Visit: Payer: BC Managed Care – PPO | Admitting: Family Medicine

## 2018-12-26 ENCOUNTER — Encounter: Payer: Self-pay | Admitting: Family Medicine

## 2018-12-26 ENCOUNTER — Other Ambulatory Visit: Payer: Self-pay

## 2018-12-26 VITALS — BP 142/88 | HR 72 | Temp 98.0°F | Resp 18 | Ht 71.5 in | Wt >= 6400 oz

## 2018-12-26 DIAGNOSIS — M7989 Other specified soft tissue disorders: Secondary | ICD-10-CM

## 2018-12-26 DIAGNOSIS — K219 Gastro-esophageal reflux disease without esophagitis: Secondary | ICD-10-CM

## 2018-12-26 DIAGNOSIS — E119 Type 2 diabetes mellitus without complications: Secondary | ICD-10-CM | POA: Diagnosis not present

## 2018-12-26 DIAGNOSIS — L723 Sebaceous cyst: Secondary | ICD-10-CM

## 2018-12-26 DIAGNOSIS — I479 Paroxysmal tachycardia, unspecified: Secondary | ICD-10-CM | POA: Diagnosis not present

## 2018-12-26 LAB — CBC
HCT: 44.4 % (ref 39.0–52.0)
Hemoglobin: 15.1 g/dL (ref 13.0–17.0)
MCHC: 34 g/dL (ref 30.0–36.0)
MCV: 79.2 fl (ref 78.0–100.0)
Platelets: 304 10*3/uL (ref 150.0–400.0)
RBC: 5.61 Mil/uL (ref 4.22–5.81)
RDW: 15.4 % (ref 11.5–15.5)
WBC: 9.5 10*3/uL (ref 4.0–10.5)

## 2018-12-26 LAB — COMPREHENSIVE METABOLIC PANEL
ALT: 27 U/L (ref 0–53)
AST: 23 U/L (ref 0–37)
Albumin: 4.2 g/dL (ref 3.5–5.2)
Alkaline Phosphatase: 70 U/L (ref 39–117)
BUN: 10 mg/dL (ref 6–23)
CO2: 32 mEq/L (ref 19–32)
Calcium: 8.8 mg/dL (ref 8.4–10.5)
Chloride: 97 mEq/L (ref 96–112)
Creatinine, Ser: 0.99 mg/dL (ref 0.40–1.50)
GFR: 78.83 mL/min (ref >60.00)
Glucose, Bld: 142 mg/dL — ABNORMAL HIGH (ref 70–99)
Potassium: 3.9 mEq/L (ref 3.5–5.1)
Sodium: 138 mEq/L (ref 135–145)
Total Bilirubin: 0.6 mg/dL (ref 0.2–1.2)
Total Protein: 7.4 g/dL (ref 6.0–8.3)

## 2018-12-26 LAB — LIPID PANEL
Cholesterol: 175 mg/dL (ref 0–200)
HDL: 41.9 mg/dL (ref >39.00)
LDL Cholesterol: 107 mg/dL — ABNORMAL HIGH (ref 0–99)
NonHDL: 132.93
Total CHOL/HDL Ratio: 4
Triglycerides: 129 mg/dL (ref 0.0–149.0)
VLDL: 25.8 mg/dL (ref 0.0–40.0)

## 2018-12-26 LAB — HEMOGLOBIN A1C: Hgb A1c MFr Bld: 7.7 % — ABNORMAL HIGH (ref 4.6–6.5)

## 2018-12-26 LAB — MAGNESIUM: Magnesium: 1.8 mg/dL (ref 1.5–2.5)

## 2018-12-26 LAB — TSH: TSH: 1.43 u[IU]/mL (ref 0.35–4.50)

## 2018-12-26 MED ORDER — PANTOPRAZOLE SODIUM 40 MG PO TBEC: 40.0000 mg | DELAYED_RELEASE_TABLET | Freq: Every day | ORAL | 3 refills | Status: DC

## 2018-12-26 MED ORDER — SULFAMETHOXAZOLE-TRIMETHOPRIM 800-160 MG PO TABS: 1.0000 | ORAL_TABLET | Freq: Two times a day (BID) | ORAL | 0 refills | Status: DC

## 2018-12-26 NOTE — Progress Notes (Signed)
Subjective:    Patient ID: Michael Boyle, male    DOB: [birth year], 53 y.o.   MRN: [id number]  HPI   Patient presents to clinic for follow-up on GERD, diabetes, BP, lower extremity swelling, breathing.  Overall states he is feeling well.  Does request a refill of Protonix.  GERD symptoms are managed with this medication.  Does have some breakthrough heartburn at times if he eats something with a lot of hot sauce but otherwise no issues with breakthrough heartburn.  States he has not checked his blood sugar in the last few weeks, he does have a meter that is functional however states he just cannot forgot about checking.  Denies having any symptoms of hypoglycemia.  Has been taking his metformin as prescribed.  Blood pressure has been stable.  Tolerating his verapamil without any issues.  Denies any issues with racing heart.  Does have chronic lower extremity swelling that is managed with torsemide.  States now that he has begun to wear tall compression stocks every day, the lower extremity swelling is more so improved and he is happy with results that the compression stockings have given him.  Denies any issues with breathing.  Denies any wheezing or feeling like he has dyspnea with exertion or decreased stamina due to his breathing.  Taking his Advair daily and also has albuterol as needed.  Has not used albuterol in months   Has sore area on right lower cheek, feels like large pimple. Has had areas like this before, usually would drain and go away. This one seems to have lingered longer than other one he has had, is more red and tender, it has drained some pus.   Patient Active Problem List   Diagnosis Date Noted  . Diabetes mellitus without complication ([place]) 24/26/8341  . Genetic testing 12/07/2017  . Colon polyps   . Annual physical exam 03/24/2017  . Diabetes mellitus type 2, uncontrolled, without complications ([place]) 96/22/2979  . Hyperglycemia 09/23/2016  . Tachycardia, paroxysmal  ([place]) 09/23/2015  . Diuretic-induced hypokalemia 09/23/2015  . Chronic thoracic back pain 09/23/2015  . Abnormal finding on EKG 09/23/2015  . COPD (chronic obstructive pulmonary disease) ([place]) 05/11/2015  . Acid reflux 05/11/2015  . Gout 05/11/2015  . Hypercholesterolemia 05/11/2015  . Morbid obesity ([place]) 05/11/2015  . Leg swelling 05/11/2015   Social History   Tobacco Use  . Smoking status: Former Smoker    Years: 5.00    Types: Cigarettes    Last attempt to quit: 06/13/2012    Years since quitting: 6.5  . Smokeless tobacco: Never Used  Substance Use Topics  . Alcohol use: No    Alcohol/week: 0.0 standard drinks   Review of Systems  Constitutional: Negative for chills, fatigue and fever.  HENT: Negative for congestion, ear pain, sinus pain and sore throat.   Eyes: Negative.   Respiratory: Negative for cough, shortness of breath and wheezing.   Cardiovascular: Negative for chest pain, palpitations. Some leg swelling, better with wearing tall compression socks.  Gastrointestinal: Negative for abdominal pain, diarrhea, nausea and vomiting.  Genitourinary: Negative for dysuria, frequency and urgency.  Musculoskeletal: Negative for arthralgias and myalgias.  Skin: +sore area on right cheek, ?cyst Neurological: Negative for syncope, light-headedness and headaches.  Psychiatric/Behavioral: The patient is not nervous/anxious.       Objective:   Physical Exam Vitals signs and nursing note reviewed.  Constitutional:      General: He is not in acute distress.    Appearance: He  is obese. He is not toxic-appearing.  HENT:     Head: Normocephalic and atraumatic.      Right Ear: Tympanic membrane, ear canal and external ear normal.     Left Ear: Tympanic membrane, ear canal and external ear normal.     Nose: Nose normal.     Mouth/Throat:     Mouth: Mucous membranes are moist.     Pharynx: Oropharynx is clear.  Eyes:     General: No scleral icterus.    Extraocular Movements:  Extraocular movements intact.     Pupils: Pupils are equal, round, and reactive to light.  Neck:     Musculoskeletal: Normal range of motion and neck supple. No neck rigidity.  Cardiovascular:     Rate and Rhythm: Normal rate and regular rhythm.  Pulmonary:     Effort: Pulmonary effort is normal.     Breath sounds: Normal breath sounds.  Abdominal:     General: Bowel sounds are normal. There is no distension.     Palpations: Abdomen is soft.  Skin:    General: Skin is warm and dry.     Findings: Erythema present.  Neurological:     Mental Status: He is alert.  Psychiatric:        Mood and Affect: Mood normal.        Behavior: Behavior normal.     Vitals:   12/26/18 0810  BP: (!) 142/88  Pulse: 72  Resp: 18  Temp: 98 F (36.7 C)  SpO2: 95%      Assessment & Plan:   type 2 diabetes -we will check A1c and blood work today.  Patient encouraged to check blood sugar at least once per day keep a log of readings.  GERD - well controlled on pantoprazole.  Med refilled.  We will also include magnesium and blood work today.  Sebaceous cyst- cyst present on right lower jaw area.  Is about size of a gumball and is red and tender.  We will treat with a course of Bactrim.  Offered referral to dermatology for evaluation of area and possible removal however patient prefer to hold off on dermatologist at this time and will take antibiotic and see how things go.  Paroxysmal tachycardia/hypertension- heart rate and blood pressure controlled on verapamil.  Leg swelling - swelling controlled with use of torsemide as well as compression socks.  Patient also encouraged to elevate legs whenever able to  Morbid obesity - discussed healthy diet and regular physical activity.  Recommended diet high in lean proteins, lots of vegetables lower than sugars and carbohydrates.  Discussed physical activity daily such as short walks and lifting of small hand weights and then increasing physical activity as  able to.  Patient will follow-up in 6 months for recheck on chronic medical conditions.  He is aware he can call clinic anytime with issues or concerns.

## 2018-12-27 ENCOUNTER — Encounter: Payer: Self-pay | Admitting: Family Medicine

## 2019-01-08 ENCOUNTER — Telehealth: Payer: Self-pay

## 2019-01-08 NOTE — Progress Notes (Signed)
Virtual Visit via Video Note   This visit type was conducted due to national recommendations for restrictions regarding the COVID-19 Pandemic (e.g. social distancing) in an effort to limit this patient's exposure and mitigate transmission in our community.  Due to his co-morbid illnesses, this patient is at least at moderate risk for complications without adequate follow up.  This format is felt to be most appropriate for this patient at this time.  All issues noted in this document were discussed and addressed.  A limited physical exam was performed with this format.  Please refer to the patient's chart for his consent to telehealth for [facility].   Date:  01/09/2019   ID:  Michael Boyle, DOB [birth year], MRN [id number]  Patient Location: Other:  Work Provider Location: Home  PCP:  Jodelle Green, FNP  Cardiologist:  Kathlyn Sacramento, MD  Electrophysiologist:  None   Evaluation Performed:  Follow-Up Visit  Chief Complaint:  Follow up  History of Present Illness:    Michael Boyle is a 53 y.o. male with history of paroxysmal tachycardia,incomplete LBBB,DM, chronicvenous insufficiency, morbid obesity, COPD from prior tobacco abusefor 30 years at 2 packs daily quitting 06/2012, GERD, and gout who presents for follow-up.   Patient was previously followed by Dr. Ubaldo Glassing, though transitioned to Dr. Fletcher Anon for evaluation of abnormal EKG. 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 He was managed on verapamil while being followed by Dr. Ubaldo Glassing without recurrent symptoms. Prior EKG in 2017 was done at his PCP's office which showed an incomplete LBBB. He denied any chest pain or discomfort at that time. He did note mild exertional dyspnea and was taking torsemide 20 mg daily for lower extremity edema. He underwent echo in 11/2015 that showed an EF of 60-65%, unable to assess wall motion, normal LV diastolic function  parameters, mildly calcified mitral annulus. Ischemic evaluation was not felt to be indicated given the patient had no symptoms of chest pain and the overall suspicion for ischemic heart disease was low. He was continued on verapamil. He was seen in the office on 10/05/2017 for preprocedural cardiac evaluation for colonoscopy. He was doing well at that time, and noted exertional dyspnea when walking up stairs or an incline. Given his shortness of breath, he underwent echocardiogram on 10/17/2017 that showed an EF of 55 to 65%, mildly dilated LV with moderate concentric LVH, normal diastolic function, mild biatrial enlargement, mildly to moderately dilated RV with normal systolic function. He was last seen in the office in 12/2017 for follow up and was doing well. He continued to note stable exertional dyspnea when ambulating up an incline as well as rare palpitations.   He was evaluated remotely by PCP on 4/27 with increased left lower extremity swelling with ultrasound being negative for DVT. Symptoms improved with compression stockings.   He is seen in telemedicine follow up today and is doing very well. He denies any symptoms consistent with palpitations. He is very pleased with verapamil as with this medication he has not had any symptoms in ~ 20 years. No chest pain. He notes stable SOB when ambulating up an incline, though on flat ground he "can walk with the best of them." His lower extremity swelling is improved with compression stockings. No dizziness, presyncope, or syncope.   Labs: 12/2018 - magnesium 1.8, LDL 107, TSH normal, K+ 3.9, SCr 0.99, AST/ALT normal, HGB 15.1, A1c 7.7  The  patient does not have symptoms concerning for COVID-19 infection (fever, chills, cough, or new shortness of breath).    Past Medical History:  Diagnosis Date   Colon polyps    COPD (chronic obstructive pulmonary disease) ([place])    Diabetes mellitus with complication (HCC)    Genetic testing 12/07/2017    Common Cancers panel (47 genes) @ [facility] - No pathogenic mutations detected   GERD (gastroesophageal reflux disease)    Gout    Incomplete left bundle branch block    Incomplete left bundle branch block (LBBB)    a. TTE 4/17: EF of 60-65%, unable to assess wall motion, normal LV diastolic function parameters, mildly calcifief mitral annulus   Morbid obesity (HCC)    Palpitations    a. felt to be pSVT   Palpitations    Venous insufficiency    Past Surgical History:  Procedure Laterality Date   COLONOSCOPY WITH PROPOFOL N/A 08/14/2015   Procedure: COLONOSCOPY WITH PROPOFOL;  Surgeon: Lollie Sails, MD;  Location: [facility] ENDOSCOPY;  Service: Endoscopy;  Laterality: N/A;   COLONOSCOPY WITH PROPOFOL N/A 11/02/2017   Procedure: COLONOSCOPY WITH PROPOFOL;  Surgeon: Lollie Sails, MD;  Location: [facility] ENDOSCOPY;  Service: Endoscopy;  Laterality: N/A;   ESOPHAGOGASTRODUODENOSCOPY (EGD) WITH PROPOFOL N/A 08/14/2015   Procedure: ESOPHAGOGASTRODUODENOSCOPY (EGD) WITH PROPOFOL;  Surgeon: Lollie Sails, MD;  Location: [facility] ENDOSCOPY;  Service: Endoscopy;  Laterality: N/A;   WISDOM TOOTH EXTRACTION       Current Meds  Medication Sig   albuterol (VENTOLIN HFA) 108 (90 Base) MCG/ACT inhaler Inhale 2 puffs into the lungs every 6 (six) hours as needed for wheezing or shortness of breath.   allopurinol (ZYLOPRIM) 100 MG tablet Take 1 tablet (100 mg total) by mouth 2 (two) times daily.   aspirin EC 81 MG tablet Take 1 tablet (81 mg total) by mouth daily.   Fluticasone-Salmeterol (ADVAIR DISKUS) 500-50 MCG/DOSE AEPB Inhale 1 puff into the lungs 2 (two) times daily.   metFORMIN (GLUCOPHAGE) 500 MG tablet Take 1 tablet (500 mg total) by mouth daily before supper.   pantoprazole (PROTONIX) 40 MG tablet Take 1 tablet (40 mg total) by mouth daily.   potassium chloride SA (K-DUR,KLOR-CON) 20 MEQ tablet Take 1 tablet (20 mEq total) by mouth daily.   sulfamethoxazole-trimethoprim (BACTRIM  DS) 800-160 MG tablet Take 1 tablet by mouth 2 (two) times daily.   torsemide (DEMADEX) 10 MG tablet Take 2 TABLETS (20 mg total) by mouth 2 (two) times daily.   verapamil (CALAN-SR) 180 MG CR tablet Take 1 tablet (180 mg total) by mouth 2 (two) times daily.     Allergies:   Patient has no known allergies.   Social History   Tobacco Use   Smoking status: Former Smoker    Years: 5.00    Types: Cigarettes    Last attempt to quit: 06/13/2012    Years since quitting: 6.5   Smokeless tobacco: Never Used  Substance Use Topics   Alcohol use: No    Alcohol/week: 0.0 standard drinks   Drug use: No     Family Hx: The patient's family history includes Cancer in his sister; Cirrhosis in his sister; Colon cancer in his paternal uncle; Diabetes in his sister; Prostate cancer in his paternal uncle.  ROS:   Please see the history of present illness.     All other systems reviewed and are negative.   Prior CV studies:   The following studies were reviewed today:  2D Echo 10/2017: -  Procedure narrative: Transthoracic echocardiography. Image   quality was adequate. The study was technically difficult, as a   result of body habitus. - Left ventricle: The cavity size was mildly dilated. There was   moderate concentric hypertrophy. Systolic function was normal. The estimated ejection fraction was in the range of 55% to 65%. Left ventricular diastolic function parameters were normal. - Left atrium: The atrium was mildly dilated. - Right ventricle: The cavity size was mildly to moderately   dilated. Systolic function was normal. - Right atrium: The atrium was mildly dilated.  Labs/Other Tests and Data Reviewed:    EKG:  No ECG reviewed.  Recent Labs: 12/26/2018: ALT 27; BUN 10; Creatinine, Ser 0.99; Hemoglobin 15.1; Magnesium 1.8; Platelets 304.0; Potassium 3.9; Sodium 138; TSH 1.43   Recent Lipid Panel Lab Results  Component Value Date/Time   CHOL 175 12/26/2018 08:27 AM   CHOL  184 09/23/2015 10:11 AM   TRIG 129.0 12/26/2018 08:27 AM   HDL 41.90 12/26/2018 08:27 AM   HDL 37 (L) 09/23/2015 10:11 AM   CHOLHDL 4 12/26/2018 08:27 AM   LDLCALC 107 (H) 12/26/2018 08:27 AM   LDLCALC 108 (H) 04/24/2017 02:15 PM    Wt Readings from Last 3 Encounters:  01/09/19 (!) 412 lb 6 oz (187.1 kg)  12/26/18 (!) 412 lb 6.4 oz (187.1 kg)  06/26/18 (!) 401 lb 9.6 oz (182.2 kg)     Objective:    Vital Signs:  BP (!) 142/88    Pulse 72    Ht 5' 11.5" (1.816 m)    Wt (!) 412 lb 6 oz (187.1 kg)    BMI 56.71 kg/m    VITAL SIGNS:  reviewed GEN:  no acute distress EYES:  sclerae anicteric, EOMI - Extraocular Movements Intact  ASSESSMENT & PLAN:    1. 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 He has not had any symptoms in ~ 20 years. He prefers to remain on this medication at this time. His periodontist has noted some gingival hyperplasia. I did discuss this with him today and we have agreed to continue current therapy without change given symptoms are well controlled. He states "I'm going to lose all my teeth anyway." If needed, could consider trial of metoprolol, which was discussed with him today as well. No indication for outpatient cardiac monitoring at this time.   2. Exertional dyspnea: Stable. Likely in the setting of his morbid obesity with physical conditioning. Recent echo reassuring as above. Prior remote stress test normal. Given stable symptoms we will continue to monitor.   3. Lower extremity swelling: Likely multifactorial including morbid obesity, venous insufficiency, and CCB use. Improved with leg elevation and compression stockings. Continue low dose torsemide with stable renal function as above.   4. Elevated blood pressure: Historically, his BP has been well controlled. Continue to monitor.   5. Morbid obesity: Weight loss is advised.    COVID-19 Education: The signs and symptoms of COVID-19 were discussed with the patient and how to  seek care for testing (follow up with PCP or arrange E-visit).  The importance of social distancing was discussed today.  Time:   Today, I have spent 18 minutes with the patient with telehealth technology discussing the above problems.     Medication Adjustments/Labs and Tests Ordered: Current medicines are reviewed at length with the patient today.  Concerns regarding medicines are outlined above.   Tests Ordered: No orders of the defined types were placed in this encounter.  Medication Changes: No orders of the defined types were placed in this encounter.   Disposition:  Follow up in 1 year(s)  Signed, Christell Faith, PA-C  01/09/2019 2:23 PM    [place]

## 2019-01-08 NOTE — Telephone Encounter (Signed)

## 2019-01-09 ENCOUNTER — Encounter: Payer: Self-pay | Admitting: Physician Assistant

## 2019-01-09 ENCOUNTER — Other Ambulatory Visit: Payer: Self-pay

## 2019-01-09 ENCOUNTER — Telehealth (INDEPENDENT_AMBULATORY_CARE_PROVIDER_SITE_OTHER): Payer: BC Managed Care – PPO | Admitting: Physician Assistant

## 2019-01-09 VITALS — BP 142/88 | HR 72 | Ht 71.5 in | Wt >= 6400 oz

## 2019-01-09 DIAGNOSIS — I479 Paroxysmal tachycardia, unspecified: Secondary | ICD-10-CM | POA: Diagnosis not present

## 2019-01-09 DIAGNOSIS — R030 Elevated blood-pressure reading, without diagnosis of hypertension: Secondary | ICD-10-CM | POA: Diagnosis not present

## 2019-01-09 DIAGNOSIS — R0609 Other forms of dyspnea: Secondary | ICD-10-CM | POA: Diagnosis not present

## 2019-01-09 DIAGNOSIS — M7989 Other specified soft tissue disorders: Secondary | ICD-10-CM | POA: Diagnosis not present

## 2019-01-09 NOTE — Patient Instructions (Signed)
It was a pleasure to speak with you on the phone today! Thank you for allowing [place] to continue taking care of your [facility] needs during this time.   Feel free to call as needed for questions and concerns related to your cardiac needs.   Medication Instructions:  Your physician recommends that you continue on your current medications as directed. Please refer to the Current Medication list given to you today.  If you need a refill on your cardiac medications before your next appointment, please call your pharmacy.   Lab work: None ordered  If you have labs (blood work) drawn today and your tests are completely normal, you will receive your results only by: Marland Kitchen MyChart Message (if you have MyChart) OR . A paper copy in the mail If you have any lab test that is abnormal or we need to change your treatment, we will call you to review the results.  Testing/Procedures: None ordered   Follow-Up: At [facility], you and your health needs are our priority.  As part of our continuing mission to provide you with exceptional heart care, we have created designated Provider Care Teams.  These Care Teams include your primary Cardiologist (physician) and Advanced Practice Providers (APPs -  Physician Assistants and Nurse Practitioners) who all work together to provide you with the care you need, when you need it. You will need a follow up appointment in 12 months. Please call 2 months ahead to schedule your appointment.  Please see Kathlyn Sacramento, MD.

## 2019-03-21 ENCOUNTER — Ambulatory Visit (INDEPENDENT_AMBULATORY_CARE_PROVIDER_SITE_OTHER): Payer: BC Managed Care – PPO | Admitting: Family Medicine

## 2019-03-21 ENCOUNTER — Other Ambulatory Visit: Payer: Self-pay

## 2019-03-21 DIAGNOSIS — L723 Sebaceous cyst: Secondary | ICD-10-CM | POA: Diagnosis not present

## 2019-03-21 DIAGNOSIS — L03221 Cellulitis of neck: Secondary | ICD-10-CM

## 2019-03-21 MED ORDER — SULFAMETHOXAZOLE-TRIMETHOPRIM 800-160 MG PO TABS: 1.0000 | ORAL_TABLET | Freq: Two times a day (BID) | ORAL | 0 refills | Status: DC

## 2019-03-21 MED ORDER — DOXYCYCLINE HYCLATE 100 MG PO TABS: 100.0000 mg | ORAL_TABLET | Freq: Two times a day (BID) | ORAL | 0 refills | Status: DC

## 2019-03-21 NOTE — Progress Notes (Signed)
Patient ID: Michael Boyle, male   DOB: 1966-05-31, 53 y.o.   MRN: [id number]    Virtual Visit via video Note  This visit type was conducted due to national recommendations for restrictions regarding the COVID-19 pandemic (e.g. social distancing).  This format is felt to be most appropriate for this patient at this time.  All issues noted in this document were discussed and addressed.  No physical exam was performed (except for noted visual exam findings with Video Visits).   I connected with Michael Boyle today at 10:20 AM EDT by a video enabled telemedicine application and verified that I am speaking with the correct person using two identifiers. Location patient: home Location provider: work or home office Persons participating in the virtual visit: patient, provider  I discussed the limitations, risks, security and privacy concerns of performing an evaluation and management service by video and the availability of in person appointments. I also discussed with the patient that there may be a patient responsible charge related to this service. The patient expressed understanding and agreed to proceed.  HPI:  Patient and I connected via video to discuss suspected inflamed cyst flaring up on left side of neck.  Patient gets these from time to time and has for years.  Often they require antibiotics.  Sometimes the cysts will burst open on their own and drained pus.  States this one has not done that.  Does feel hot and tender to touch.  Denies any fever or chills.  Denies cough, shortness of breath or wheezing.  Denies chest pain.  Denies body aches.    ROS: See pertinent positives and negatives per HPI.  Past Medical History:  Diagnosis Date  . Colon polyps   . COPD (chronic obstructive pulmonary disease) ([place])   . Diabetes mellitus with complication ([place])   . Genetic testing 12/07/2017   Common Cancers panel (47 genes) @ [facility] - No pathogenic mutations detected  . GERD  (gastroesophageal reflux disease)   . Gout   . Incomplete left bundle branch block   . Incomplete left bundle branch block (LBBB)    a. TTE 4/17: EF of 60-65%, unable to assess wall motion, normal LV diastolic function parameters, mildly calcifief mitral annulus  . Morbid obesity ([place])   . Palpitations    a. felt to be pSVT  . Palpitations   . Venous insufficiency     Past Surgical History:  Procedure Laterality Date  . COLONOSCOPY WITH PROPOFOL N/A 08/14/2015   Procedure: COLONOSCOPY WITH PROPOFOL;  Surgeon: Lollie Sails, MD;  Location[facility] ENDOSCOPY;  Service: Endoscopy;  Laterality: N/A;  . COLONOSCOPY WITH PROPOFOL N/A 11/02/2017   Procedure: COLONOSCOPY WITH PROPOFOL;  Surgeon: Lollie Sails, MD;  Location: [facility] ENDOSCOPY;  Service: Endoscopy;  Laterality: N/A;  . ESOPHAGOGASTRODUODENOSCOPY (EGD) WITH PROPOFOL N/A 08/14/2015   Procedure: ESOPHAGOGASTRODUODENOSCOPY (EGD) WITH PROPOFOL;  Surgeon: Lollie Sails, MD;  Location: [facility] ENDOSCOPY;  Service: Endoscopy;  Laterality: N/A;  . WISDOM TOOTH EXTRACTION      Family History  Problem Relation Age of Onset  . Cancer Sister        lymphoma dx 25s; currently 102  . Diabetes Sister   . Cirrhosis Sister   . Colon cancer Paternal Uncle        dx 3s; deceased 25  . Prostate cancer Paternal Uncle        dx 39s; currently 71    Social History   Tobacco Use  . Smoking status: Former  Smoker    Years: 5.00    Types: Cigarettes    Quit date: 06/13/2012    Years since quitting: 6.7  . Smokeless tobacco: Never Used  Substance Use Topics  . Alcohol use: No    Alcohol/week: 0.0 standard drinks    Current Outpatient Medications:  .  albuterol (VENTOLIN HFA) 108 (90 Base) MCG/ACT inhaler, Inhale 2 puffs into the lungs every 6 (six) hours as needed for wheezing or shortness of breath., Disp: 1 Inhaler, Rfl: 2 .  allopurinol (ZYLOPRIM) 100 MG tablet, Take 1 tablet (100 mg total) by mouth 2 (two) times daily., Disp: 180  tablet, Rfl: 1 .  aspirin EC 81 MG tablet, Take 1 tablet (81 mg total) by mouth daily., Disp: 90 tablet, Rfl: 3 .  Fluticasone-Salmeterol (ADVAIR DISKUS) 500-50 MCG/DOSE AEPB, Inhale 1 puff into the lungs 2 (two) times daily., Disp: 60 each, Rfl: 5 .  metFORMIN (GLUCOPHAGE) 500 MG tablet, Take 1 tablet (500 mg total) by mouth daily before supper., Disp: 90 tablet, Rfl: 0 .  pantoprazole (PROTONIX) 40 MG tablet, Take 1 tablet (40 mg total) by mouth daily., Disp: 90 tablet, Rfl: 3 .  potassium chloride SA (K-DUR,KLOR-CON) 20 MEQ tablet, Take 1 tablet (20 mEq total) by mouth daily., Disp: 90 tablet, Rfl: 1 .  sulfamethoxazole-trimethoprim (BACTRIM DS) 800-160 MG tablet, Take 1 tablet by mouth 2 (two) times daily., Disp: 20 tablet, Rfl: 0 .  torsemide (DEMADEX) 10 MG tablet, Take 2 TABLETS (20 mg total) by mouth 2 (two) times daily., Disp: 360 tablet, Rfl: 1 .  verapamil (CALAN-SR) 180 MG CR tablet, Take 1 tablet (180 mg total) by mouth 2 (two) times daily., Disp: 180 tablet, Rfl: 1  EXAM:  GENERAL: alert, oriented, appears well and in no acute distress  HEENT: atraumatic, conjunttiva clear, no obvious abnormalities on inspection of external nose and ears  NECK: normal movements of the head and neck.  Visibly able to see red raised circular area on left side of neck, does appear infected type of cyst.  Red area is approximately size of a golf ball.  Per patient it is hot to touch.  It has not come to ahead and is not open or draining at this time.  LUNGS: on inspection no signs of respiratory distress, breathing rate appears normal, no obvious gross SOB, gasping or wheezing  CV: no obvious cyanosis  MS: moves all visible extremities without noticeable abnormality  PSYCH/NEURO: pleasant and cooperative, no obvious depression or anxiety, speech and thought processing grossly intact  ASSESSMENT AND PLAN:  Discussed the following assessment and plan:  Infected cyst of skin, cellulitis of neck -  we will have patient take course of Bactrim and doxycycline to treat skin infection and inflamed cyst.  Patient advised that if this does not help or improve the redness, and tenderness at that point we most likely will have to refer to dermatology or general surgery for evaluation and possible lancing.  Patient will call on Monday with update on how he is doing.  Did offer to schedule follow-up appointment, but states he would prefer to call with update rather than schedule appointment at this time.   I discussed the assessment and treatment plan with the patient. The patient was provided an opportunity to ask questions and all were answered. The patient agreed with the plan and demonstrated an understanding of the instructions.   The patient was advised to call back or seek an in-person evaluation if the symptoms worsen or  if the condition fails to improve as anticipated.   Jodelle Green, FNP

## 2019-05-09 ENCOUNTER — Telehealth: Payer: Self-pay | Admitting: Lab

## 2019-05-09 DIAGNOSIS — E119 Type 2 diabetes mellitus without complications: Secondary | ICD-10-CM

## 2019-05-09 MED ORDER — GLUCOSE BLOOD VI STRP: ORAL_STRIP | 12 refills | Status: AC

## 2019-05-09 NOTE — Telephone Encounter (Signed)
Sent in

## 2019-05-09 NOTE — Addendum Note (Signed)
Addended by: Philis Nettle on: 05/09/2019 03:15 PM   Modules accepted: Orders

## 2019-05-09 NOTE — Telephone Encounter (Signed)
[place] a Refill request for- Contour [employer]  Quantity 50 STR   I have looked in the Pt chart and I don't see where the Pt has had this before.

## 2019-05-21 ENCOUNTER — Telehealth: Payer: Self-pay

## 2019-05-21 NOTE — Telephone Encounter (Signed)
Copied from [place] [id number]. Topic: Appointment Scheduling - Scheduling Inquiry for Clinic >> May 21, 2019  2:14 PM Erick Blinks wrote: Reason for CRM: Pt called requesting soonest appt. Called office. Pt has a cyst that is leaking again, wants to see PCP [phone]

## 2019-05-21 NOTE — Telephone Encounter (Signed)
Patient was scheduled an in-person visit w/ Dr. Aundra Dubin for tomorrow at 2:00 pm.  No fever, No COVID symptoms or exposure.

## 2019-05-22 ENCOUNTER — Other Ambulatory Visit: Payer: Self-pay

## 2019-05-22 ENCOUNTER — Ambulatory Visit: Payer: BC Managed Care – PPO | Admitting: Internal Medicine

## 2019-05-22 ENCOUNTER — Encounter: Payer: Self-pay | Admitting: Internal Medicine

## 2019-05-22 VITALS — BP 126/78 | HR 73 | Temp 98.1°F | Ht 71.5 in | Wt >= 6400 oz

## 2019-05-22 DIAGNOSIS — E119 Type 2 diabetes mellitus without complications: Secondary | ICD-10-CM | POA: Diagnosis not present

## 2019-05-22 DIAGNOSIS — E559 Vitamin D deficiency, unspecified: Secondary | ICD-10-CM | POA: Diagnosis not present

## 2019-05-22 DIAGNOSIS — Z23 Encounter for immunization: Secondary | ICD-10-CM

## 2019-05-22 DIAGNOSIS — L918 Other hypertrophic disorders of the skin: Secondary | ICD-10-CM

## 2019-05-22 DIAGNOSIS — Z1329 Encounter for screening for other suspected endocrine disorder: Secondary | ICD-10-CM

## 2019-05-22 DIAGNOSIS — Z125 Encounter for screening for malignant neoplasm of prostate: Secondary | ICD-10-CM

## 2019-05-22 DIAGNOSIS — Z1283 Encounter for screening for malignant neoplasm of skin: Secondary | ICD-10-CM

## 2019-05-22 DIAGNOSIS — L723 Sebaceous cyst: Secondary | ICD-10-CM

## 2019-05-22 DIAGNOSIS — L720 Epidermal cyst: Secondary | ICD-10-CM

## 2019-05-22 NOTE — Telephone Encounter (Signed)
He probably needs referral to ENT or gen surgery for eval to remove

## 2019-05-22 NOTE — Patient Instructions (Addendum)
Hibiclens soap  [place]  Epidermal Cyst  An epidermal cyst is a sac made of skin tissue. The sac contains a substance called keratin. Keratin is a protein that is normally secreted through the hair follicles. When keratin becomes trapped in the top layer of skin (epidermis), it can form an epidermal cyst. Epidermal cysts can be found anywhere on your body. These cysts are usually harmless (benign), and they may not cause symptoms unless they become infected. What are the causes? This condition may be caused by:  A blocked hair follicle.  A hair that curls and re-enters the skin instead of growing straight out of the skin (ingrown hair).  A blocked pore.  Irritated skin.  An injury to the skin.  Certain conditions that are passed along from parent to child (inherited).  Human papillomavirus (HPV).  Long-term (chronic) sun damage to the skin. What increases the risk? The following factors may make you more likely to develop an epidermal cyst:  Having acne.  Being overweight.  Being 96-24 years old. What are the signs or symptoms? The only symptom of this condition may be a small, painless lump underneath the skin. When an epidermal cyst ruptures, it may become infected. Symptoms may include:  Redness.  Inflammation.  Tenderness.  Warmth.  Fever.  Keratin draining from the cyst. Keratin is grayish-white, bad-smelling substance.  Pus draining from the cyst. How is this diagnosed? This condition is diagnosed with a physical exam.  In some cases, you may have a sample of tissue (biopsy) taken from your cyst to be examined under a microscope or tested for bacteria.  You may be referred to a health care provider who specializes in skin care (dermatologist). How is this treated? In many cases, epidermal cysts go away on their own without treatment. If a cyst becomes infected, treatment may include:  Opening and draining the cyst, done by a health  care provider. After draining, minor surgery to remove the rest of the cyst may be done.  Antibiotic medicine.  Injections of medicines (steroids) that help to reduce inflammation.  Surgery to remove the cyst. Surgery may be done if the cyst: ? Becomes large. ? Bothers you. ? Has a chance of turning into cancer.  Do not try to open a cyst yourself. Follow these instructions at home:  Take over-the-counter and prescription medicines only as told by your health care provider.  If you were prescribed an antibiotic medicine, take it it as told by your health care provider. Do not stop using the antibiotic even if you start to feel better.  Keep the area around your cyst clean and dry.  Wear loose, dry clothing.  Avoid touching your cyst.  Check your cyst every day for signs of infection. Check for: ? Redness, swelling, or pain. ? Fluid or blood. ? Warmth. ? Pus or a bad smell.  Keep all follow-up visits as told by your health care provider. This is important. How is this prevented?  Wear clean, dry, clothing.  Avoid wearing tight clothing.  Keep your skin clean and dry. Take showers or baths every day. Contact a health care provider if:  Your cyst develops symptoms of infection.  Your condition is not improving or is getting worse.  You develop a cyst that looks different from other cysts you have had.  You have a fever. Get help right away if:  Redness spreads from the cyst into the surrounding area. Summary  An epidermal  cyst is a sac made of skin tissue. These cysts are usually harmless (benign), and they may not cause symptoms unless they become infected.  If a cyst becomes infected, treatment may include surgery to open and drain the cyst, or to remove it. Treatment may also include medicines by mouth or through an injection.  Take over-the-counter and prescription medicines only as told by your health care provider. If you were prescribed an antibiotic  medicine, take it as told by your health care provider. Do not stop using the antibiotic even if you start to feel better.  Contact a health care provider if your condition is not improving or is getting worse.  Keep all follow-up visits as told by your health care provider. This is important. This information is not intended to replace advice given to you by your health care provider. Make sure you discuss any questions you have with your health care provider. Document Released: 06/25/2004 Document Revised: 11/15/2018 Document Reviewed: 02/05/2018 Elsevier Patient Education  2020 [employer].

## 2019-05-22 NOTE — Progress Notes (Signed)
Chief Complaint  Patient presents with  . Follow-up   F/u  1. Left neck inflamed cyst draining red thick pus been there 15 years and 6 weeks ago constantly draining daily and he has to squeeze daily in the am. Tried bactrim and doxy 03/21/2019 which helped initially. He also has cyst to right neck size of pea to small dime which is not inflamed and several skin tags ~200 to body he wants removed 2. DM 2 last A1C 12/2018 7.7    Review of Systems  Constitutional: Negative for weight loss.  HENT: Negative for hearing loss.   Eyes: Negative for blurred vision.  Respiratory: Negative for cough.   Cardiovascular: Negative for chest pain.  Gastrointestinal: Negative for abdominal pain.  Musculoskeletal: Negative for falls.  Skin: Negative for rash.  Neurological: Negative for headaches.  Psychiatric/Behavioral: Negative for depression.   Past Medical History:  Diagnosis Date  . Colon polyps   . COPD (chronic obstructive pulmonary disease) ([place])   . Diabetes mellitus with complication ([place])   . Genetic testing 12/07/2017   Common Cancers panel (47 genes) @ [facility] - No pathogenic mutations detected  . GERD (gastroesophageal reflux disease)   . Gout   . Incomplete left bundle branch block   . Incomplete left bundle branch block (LBBB)    a. TTE 4/17: EF of 60-65%, unable to assess wall motion, normal LV diastolic function parameters, mildly calcifief mitral annulus  . Morbid obesity ([place])   . Palpitations    a. felt to be pSVT  . Palpitations   . Venous insufficiency    Past Surgical History:  Procedure Laterality Date  . COLONOSCOPY WITH PROPOFOL N/A 08/14/2015   Procedure: COLONOSCOPY WITH PROPOFOL;  Surgeon: Lollie Sails, MD;  Location: [facility] ENDOSCOPY;  Service: Endoscopy;  Laterality: N/A;  . COLONOSCOPY WITH PROPOFOL N/A 11/02/2017   Procedure: COLONOSCOPY WITH PROPOFOL;  Surgeon: Lollie Sails, MD;  Location: [facility] ENDOSCOPY;  Service: Endoscopy;  Laterality: N/A;  .  ESOPHAGOGASTRODUODENOSCOPY (EGD) WITH PROPOFOL N/A 08/14/2015   Procedure: ESOPHAGOGASTRODUODENOSCOPY (EGD) WITH PROPOFOL;  Surgeon: Lollie Sails, MD;  Location: [facility] ENDOSCOPY;  Service: Endoscopy;  Laterality: N/A;  . WISDOM TOOTH EXTRACTION     Family History  Problem Relation Age of Onset  . Cancer Sister        lymphoma dx 15s; currently 4  . Diabetes Sister   . Cirrhosis Sister   . Colon cancer Paternal Uncle        dx 85s; deceased 95  . Prostate cancer Paternal Uncle        dx 72s; currently 73   Social History   Socioeconomic History  . Marital status: Married    Spouse name: Not on file  . Number of children: Not on file  . Years of education: Not on file  . Highest education level: Not on file  Occupational History  . Not on file  Social Needs  . Financial resource strain: Not on file  . Food insecurity    Worry: Not on file    Inability: Not on file  . Transportation needs    Medical: Not on file    Non-medical: Not on file  Tobacco Use  . Smoking status: Former Smoker    Years: 5.00    Types: Cigarettes    Quit date: 06/13/2012    Years since quitting: 6.9  . Smokeless tobacco: Never Used  Substance and Sexual Activity  . Alcohol use: No    Alcohol/week:  0.0 standard drinks  . Drug use: No  . Sexual activity: Not on file  Lifestyle  . Physical activity    Days per week: Not on file    Minutes per session: Not on file  . Stress: Not on file  Relationships  . Social Herbalist on phone: Not on file    Gets together: Not on file    Attends religious service: Not on file    Active member of club or organization: Not on file    Attends meetings of clubs or organizations: Not on file    Relationship status: Not on file  . Intimate partner violence    Fear of current or ex partner: Not on file    Emotionally abused: Not on file    Physically abused: Not on file    Forced sexual activity: Not on file  Other Topics Concern  . Not on  file  Social History Narrative  . Not on file   Current Meds  Medication Sig  . albuterol (VENTOLIN HFA) 108 (90 Base) MCG/ACT inhaler Inhale 2 puffs into the lungs every 6 (six) hours as needed for wheezing or shortness of breath.  . allopurinol (ZYLOPRIM) 100 MG tablet Take 1 tablet (100 mg total) by mouth 2 (two) times daily.  Marland Kitchen aspirin EC 81 MG tablet Take 1 tablet (81 mg total) by mouth daily.  Marland Kitchen doxycycline (VIBRA-TABS) 100 MG tablet Take 1 tablet (100 mg total) by mouth 2 (two) times daily.  . Fluticasone-Salmeterol (ADVAIR DISKUS) 500-50 MCG/DOSE AEPB Inhale 1 puff into the lungs 2 (two) times daily.  Marland Kitchen glucose blood test strip Use as instructed  . metFORMIN (GLUCOPHAGE) 500 MG tablet Take 1 tablet (500 mg total) by mouth daily before supper.  . pantoprazole (PROTONIX) 40 MG tablet Take 1 tablet (40 mg total) by mouth daily.  . potassium chloride SA (K-DUR,KLOR-CON) 20 MEQ tablet Take 1 tablet (20 mEq total) by mouth daily.  Marland Kitchen sulfamethoxazole-trimethoprim (BACTRIM DS) 800-160 MG tablet Take 1 tablet by mouth 2 (two) times daily.  Marland Kitchen torsemide (DEMADEX) 10 MG tablet Take 2 TABLETS (20 mg total) by mouth 2 (two) times daily.  . verapamil (CALAN-SR) 180 MG CR tablet Take 1 tablet (180 mg total) by mouth 2 (two) times daily.   No Known Allergies No results found for this or any previous visit (from the past 2160 hour(s)). Objective  Body mass index is 55.34 kg/m. Wt Readings from Last 3 Encounters:  05/22/19 (!) 402 lb 6.4 oz (182.5 kg)  01/09/19 (!) 412 lb 6 oz (187.1 kg)  12/26/18 (!) 412 lb 6.4 oz (187.1 kg)   Temp Readings from Last 3 Encounters:  05/22/19 98.1 F (36.7 C) (Oral)  12/26/18 98 F (36.7 C) (Oral)  06/26/18 97.7 F (36.5 C) (Oral)   BP Readings from Last 3 Encounters:  05/22/19 126/78  01/09/19 (!) 142/88  12/26/18 (!) 142/88   Pulse Readings from Last 3 Encounters:  05/22/19 73  01/09/19 72  12/26/18 72    Physical Exam Vitals signs and nursing  note reviewed.  Constitutional:      Appearance: Normal appearance. He is well-developed and well-groomed. He is morbidly obese.     Comments: +mask on    HENT:     Head: Normocephalic and atraumatic.  Eyes:     Conjunctiva/sclera: Conjunctivae normal.     Pupils: Pupils are equal, round, and reactive to light.  Cardiovascular:     Rate and Rhythm:  Normal rate and regular rhythm.     Heart sounds: Normal heart sounds. No murmur.  Pulmonary:     Effort: Pulmonary effort is normal.     Breath sounds: Normal breath sounds.  Skin:    General: Skin is warm and dry.          Comments: Cyst tract left neck draining serosang drainage with inflamed cyst  Right neck likely EIC as well  Skin tags to trunk  Sks trunk and extremities and possibly AK left hand with sks to hand    Neurological:     General: No focal deficit present.     Mental Status: He is alert and oriented to person, place, and time.     Gait: Gait normal.  Psychiatric:        Attention and Perception: Attention and perception normal.        Mood and Affect: Mood and affect normal.        Speech: Speech normal.        Behavior: Behavior normal. Behavior is cooperative.        Thought Content: Thought content normal.        Cognition and Memory: Cognition and memory normal.        Judgment: Judgment normal.     Assessment  Plan  Sebaceous cyst - Plan: Ambulatory referral to Dermatology rec hibiclens soap for now and needs removal   Type 2 diabetes mellitus without complication, without long-term current use of insulin ([place]) - Plan: Comprehensive metabolic panel, CBC with Differential/Platelet, Lipid panel, Urinalysis, Routine w reflex microscopic, Microalbumin / creatinine urine ratio, Hemoglobin A1c -also due for eye exam [facility] 06/2019 will need referral with PCP  Vitamin D deficiency - Plan: Vitamin D (25 hydroxy)  Epidermal inclusion cyst - Plan: Ambulatory referral to Dermatology  Skin tag - Plan:  Ambulatory referral to Dermatology  HM Need for immunization against influenza - Plan: Flu Vaccine QUAD 36+ mos IM  Flu given today  Consider Tdap and shingrix  pna 23   Check PSA and will need DRE in future  Colonoscopy due 10/2019 [facility]  Referral dermatology  Provider: Dr. Olivia Mackie McLean-Scocuzza-Internal Medicine

## 2019-06-05 ENCOUNTER — Other Ambulatory Visit: Payer: Self-pay

## 2019-06-06 ENCOUNTER — Other Ambulatory Visit (INDEPENDENT_AMBULATORY_CARE_PROVIDER_SITE_OTHER): Payer: BC Managed Care – PPO

## 2019-06-06 ENCOUNTER — Other Ambulatory Visit: Payer: Self-pay

## 2019-06-06 DIAGNOSIS — Z125 Encounter for screening for malignant neoplasm of prostate: Secondary | ICD-10-CM | POA: Diagnosis not present

## 2019-06-06 DIAGNOSIS — E119 Type 2 diabetes mellitus without complications: Secondary | ICD-10-CM

## 2019-06-06 DIAGNOSIS — E559 Vitamin D deficiency, unspecified: Secondary | ICD-10-CM | POA: Diagnosis not present

## 2019-06-06 LAB — COMPREHENSIVE METABOLIC PANEL
ALT: 33 U/L (ref 0–53)
AST: 26 U/L (ref 0–37)
Albumin: 4.4 g/dL (ref 3.5–5.2)
Alkaline Phosphatase: 72 U/L (ref 39–117)
BUN: 9 mg/dL (ref 6–23)
CO2: 34 mEq/L — ABNORMAL HIGH (ref 19–32)
Calcium: 9.2 mg/dL (ref 8.4–10.5)
Chloride: 96 mEq/L (ref 96–112)
Creatinine, Ser: 0.99 mg/dL (ref 0.40–1.50)
GFR: 78.69 mL/min (ref >60.00)
Glucose, Bld: 135 mg/dL — ABNORMAL HIGH (ref 70–99)
Potassium: 3.8 mEq/L (ref 3.5–5.1)
Sodium: 139 mEq/L (ref 135–145)
Total Bilirubin: 0.7 mg/dL (ref 0.2–1.2)
Total Protein: 7.4 g/dL (ref 6.0–8.3)

## 2019-06-06 LAB — LIPID PANEL
Cholesterol: 181 mg/dL (ref 0–200)
HDL: 35.9 mg/dL — ABNORMAL LOW (ref >39.00)
LDL Cholesterol: 120 mg/dL — ABNORMAL HIGH (ref 0–99)
NonHDL: 144.88
Total CHOL/HDL Ratio: 5
Triglycerides: 122 mg/dL (ref 0.0–149.0)
VLDL: 24.4 mg/dL (ref 0.0–40.0)

## 2019-06-06 LAB — CBC WITH DIFFERENTIAL/PLATELET
Basophils Absolute: 0.1 10*3/uL (ref 0.0–0.1)
Basophils Relative: 1.9 % (ref 0.0–3.0)
Eosinophils Absolute: 0.2 10*3/uL (ref 0.0–0.7)
Eosinophils Relative: 2.6 % (ref 0.0–5.0)
HCT: 44.4 % (ref 39.0–52.0)
Hemoglobin: 14.6 g/dL (ref 13.0–17.0)
Lymphocytes Relative: 24.4 % (ref 12.0–46.0)
Lymphs Abs: 1.8 10*3/uL (ref 0.7–4.0)
MCHC: 32.9 g/dL (ref 30.0–36.0)
MCV: 80.2 fl (ref 78.0–100.0)
Monocytes Absolute: 0.7 10*3/uL (ref 0.1–1.0)
Monocytes Relative: 9.2 % (ref 3.0–12.0)
Neutro Abs: 4.4 10*3/uL (ref 1.4–7.7)
Neutrophils Relative %: 61.9 % (ref 43.0–77.0)
Platelets: 334 10*3/uL (ref 150.0–400.0)
RBC: 5.54 Mil/uL (ref 4.22–5.81)
RDW: 15.3 % (ref 11.5–15.5)
WBC: 7.2 10*3/uL (ref 4.0–10.5)

## 2019-06-06 LAB — HEMOGLOBIN A1C: Hgb A1c MFr Bld: 7 % — ABNORMAL HIGH (ref 4.6–6.5)

## 2019-06-07 LAB — URINALYSIS, ROUTINE W REFLEX MICROSCOPIC
Bilirubin Urine: NEGATIVE
Glucose, UA: NEGATIVE
Hgb urine dipstick: NEGATIVE
Ketones, ur: NEGATIVE
Leukocytes,Ua: NEGATIVE
Nitrite: NEGATIVE
Protein, ur: NEGATIVE
Specific Gravity, Urine: 1.011 (ref 1.001–1.03)
pH: 7.5 (ref 5.0–8.0)

## 2019-06-07 LAB — PSA: PSA: 0.6 ng/mL (ref 0.10–4.00)

## 2019-06-07 LAB — MICROALBUMIN / CREATININE URINE RATIO
Creatinine, Urine: 57 mg/dL (ref 20–320)
Microalb Creat Ratio: 5 mcg/mg creat (ref <30)
Microalb, Ur: 0.3 mg/dL

## 2019-06-07 LAB — VITAMIN D 25 HYDROXY (VIT D DEFICIENCY, FRACTURES): VITD: 25.04 ng/mL — ABNORMAL LOW (ref 30.00–100.00)

## 2019-06-13 ENCOUNTER — Other Ambulatory Visit: Payer: Self-pay | Admitting: Family Medicine

## 2019-06-13 DIAGNOSIS — E119 Type 2 diabetes mellitus without complications: Secondary | ICD-10-CM

## 2019-06-14 ENCOUNTER — Other Ambulatory Visit: Payer: Self-pay

## 2019-06-14 DIAGNOSIS — E119 Type 2 diabetes mellitus without complications: Secondary | ICD-10-CM

## 2019-06-14 MED ORDER — METFORMIN HCL 500 MG PO TABS: 500.0000 mg | ORAL_TABLET | Freq: Every day | ORAL | 0 refills | Status: DC

## 2019-06-18 ENCOUNTER — Encounter: Payer: Self-pay | Admitting: Surgery

## 2019-06-18 ENCOUNTER — Other Ambulatory Visit: Payer: Self-pay

## 2019-06-18 ENCOUNTER — Ambulatory Visit: Payer: BC Managed Care – PPO | Admitting: Surgery

## 2019-06-18 VITALS — BP 165/91 | HR 67 | Temp 97.5°F | Resp 12 | Ht 71.0 in | Wt >= 6400 oz

## 2019-06-18 DIAGNOSIS — Z8679 Personal history of other diseases of the circulatory system: Secondary | ICD-10-CM | POA: Insufficient documentation

## 2019-06-18 DIAGNOSIS — L600 Ingrowing nail: Secondary | ICD-10-CM | POA: Insufficient documentation

## 2019-06-18 DIAGNOSIS — L089 Local infection of the skin and subcutaneous tissue, unspecified: Secondary | ICD-10-CM

## 2019-06-18 DIAGNOSIS — S90829A Blister (nonthermal), unspecified foot, initial encounter: Secondary | ICD-10-CM | POA: Insufficient documentation

## 2019-06-18 DIAGNOSIS — R609 Edema, unspecified: Secondary | ICD-10-CM | POA: Insufficient documentation

## 2019-06-18 DIAGNOSIS — L723 Sebaceous cyst: Secondary | ICD-10-CM

## 2019-06-18 DIAGNOSIS — R000 Tachycardia, unspecified: Secondary | ICD-10-CM | POA: Insufficient documentation

## 2019-06-18 DIAGNOSIS — G4733 Obstructive sleep apnea (adult) (pediatric): Secondary | ICD-10-CM | POA: Insufficient documentation

## 2019-06-18 DIAGNOSIS — Z23 Encounter for immunization: Secondary | ICD-10-CM | POA: Insufficient documentation

## 2019-06-18 NOTE — Patient Instructions (Addendum)
Please see your follow up appointment listed below.      Epidermal Cyst  An epidermal cyst is a small, painless lump under your skin. The cyst contains a grayish-white, bad-smelling substance (keratin). Do not try to pop or open an epidermal cyst yourself. What are the causes?  A blocked hair follicle.  A hair that curls and re-enters the skin instead of growing straight out of the skin.  A blocked pore.  Irritated skin.  An injury to the skin.  Certain conditions that are passed along from parent to child (inherited).  Human papillomavirus (HPV).  Long-term sun damage to the skin. What increases the risk?  Having acne.  Being overweight.  Being 36-77 years old. What are the signs or symptoms? These cysts are usually harmless, but they can get infected. Symptoms of infection may include:  Redness.  Inflammation.  Tenderness.  Warmth.  Fever.  A grayish-white, bad-smelling substance drains from the cyst.  Pus drains from the cyst. How is this treated? In many cases, epidermal cysts go away on their own without treatment. If a cyst becomes infected, treatment may include:  Opening and draining the cyst, done by a doctor. After draining, you may need minor surgery to remove the rest of the cyst.  Antibiotic medicine.  Shots of medicines (steroids) that help to reduce inflammation.  Surgery to remove the cyst. Surgery may be done if the cyst: ? Becomes large. ? Bothers you. ? Has a chance of turning into cancer.  Do not try to open a cyst yourself. Follow these instructions at home:  Take over-the-counter and prescription medicines only as told by your doctor.  If you were prescribed an antibiotic medicine, take it it as told by your doctor. Do not stop using the antibiotic even if you start to feel better.  Keep the area around your cyst clean and dry.  Wear loose, dry clothing.  Avoid touching your cyst.  Check your cyst every day for signs of  infection. Check for: ? Redness, swelling, or pain. ? Fluid or blood. ? Warmth. ? Pus or a bad smell.  Keep all follow-up visits as told by your doctor. This is important. How is this prevented?  Wear clean, dry, clothing.  Avoid wearing tight clothing.  Keep your skin clean and dry. Take showers or baths every day. Contact a doctor if:  Your cyst has symptoms of infection.  Your condition does not improve or gets worse.  You have a cyst that looks different from other cysts you have had.  You have a fever. Get help right away if:  Redness spreads from the cyst into the area close by. Summary  An epidermal cyst is a sac made of skin tissue.  If a cyst becomes infected, treatment may include surgery to open and drain the cyst, or to remove it.  Take over-the-counter and prescription medicines only as told by your doctor.  Contact a doctor if your condition is not improving or is getting worse.  Keep all follow-up visits as told by your doctor. This is important. This information is not intended to replace advice given to you by your health care provider. Make sure you discuss any questions you have with your health care provider. Document Released: 09/01/2004 Document Revised: 11/15/2018 Document Reviewed: 05/03/2018 Elsevier Patient Education  2020 [employer]. Epidermal Cyst Removal, Care After This sheet gives you information about how to care for yourself after your procedure. Your health care provider may also give you  more specific instructions. If you have problems or questions, contact your health care provider. What can I expect after the procedure? After the procedure, it is common to have:  Soreness in the area where your cyst was removed.  Tightness or itchiness from the stitches (sutures) in your skin. Follow these instructions at home: Medicines  Take over-the-counter and prescription medicines only as told by your health care provider.  If you were  prescribed an antibiotic medicine or ointment, take or apply it as told by your health care provider. Do not stop using the antibiotic even if you start to feel better. Incision care   Follow instructions from your health care provider about how to take care of your incision. Make sure you: ? Wash your hands with soap and water before you change your bandage (dressing). If soap and water are not available, use hand sanitizer. ? Change your dressing as told by your health care provider. ? Leave sutures, skin glue, or adhesive strips in place. These skin closures may need to stay in place for 1-2 weeks or longer. If adhesive strip edges start to loosen and curl up, you may trim the loose edges. Do not remove adhesive strips completely unless your health care provider tells you to do that.  Keep the dressingdry until your health care provider says that it can be removed.  After your dressing is off, check your incision area every day for signs of infection. Check for: ? Redness, swelling, or pain. ? Fluid or blood. ? Warmth. ? Pus or a bad smell. General instructions  Do not take baths, swim, or use a hot tub until your health care provider approves. Ask your health care provider if you may take showers. You may only be allowed to take sponge baths.  Your health care provider may ask you to avoid contact sports or activities that take a lot of effort. Do not do anything that stretches or puts pressure on your incision.  You can return to your normal diet.  Keep all follow-up visits as told by your health care provider. This is important. Contact a health care provider if:  You have a fever.  You have redness, swelling, or pain in the incision area.  You have fluid or blood coming from your incision.  You have pus or a bad smell coming from your incision.  Your incision feels warm to the touch.  Your cyst grows back. Summary  After the procedure, it is common to have soreness in  the area where your cyst was removed.  Take or apply over-the-counter and prescription medicines only as told by your health care provider.  Follow instructions from your health care provider about how to take care of your incision. This information is not intended to replace advice given to you by your health care provider. Make sure you discuss any questions you have with your health care provider. Document Released: 08/15/2014 Document Revised: 11/14/2017 Document Reviewed: 05/18/2017 Elsevier Patient Education  2020 [employer].

## 2019-06-18 NOTE — Progress Notes (Signed)
06/18/2019  Reason for Visit:  Infected sebaceous cyst of the neck  Referring Provider:  Dallie Dad, MD  History of Present Illness: Michael Boyle is a 53 y.o. male presenting for evaluation of an infected left neck sebaceous cyst.  The patient reports he has had this cyst for several years and had never flared up until about two months ago.  He completed a course of antibiotics at that point and the redness and tenderness improved, but he has developed a small draining sinus from the cyst.  He was referred to a dermatologist and then referred to [place] for further evaluation and management.  The patient denies any significant pain at this point but reports it's more aggravating because of the drainage.  He tries to squeeze some fluid in the morning to prevent his shirts from getting stained.  Denies any fevers, chills, chest pain, shortness of breath.  Reports he has multiple sebaceous cyst and point at his right neck and back and reports he had an I&D of a back cyst in the past and it has not flared up again.  The cyst in the right neck flared up earlier but resolved with antibiotics alone.  Past Medical History: Past Medical History:  Diagnosis Date  . Colon polyps   . COPD (chronic obstructive pulmonary disease) ([place])   . Diabetes mellitus with complication ([place])   . Genetic testing 12/07/2017   Common Cancers panel (47 genes) @ [facility] - No pathogenic mutations detected  . GERD (gastroesophageal reflux disease)   . Gout   . Incomplete left bundle branch block   . Incomplete left bundle branch block (LBBB)    a. TTE 4/17: EF of 60-65%, unable to assess wall motion, normal LV diastolic function parameters, mildly calcifief mitral annulus  . Morbid obesity ([place])   . Palpitations    a. felt to be pSVT  . Palpitations   . Venous insufficiency      Past Surgical History: Past Surgical History:  Procedure Laterality Date  . COLONOSCOPY WITH PROPOFOL N/A 08/14/2015   Procedure:  COLONOSCOPY WITH PROPOFOL;  Surgeon: Lollie Sails, MD;  Location: [facility] ENDOSCOPY;  Service: Endoscopy;  Laterality: N/A;  . COLONOSCOPY WITH PROPOFOL N/A 11/02/2017   Procedure: COLONOSCOPY WITH PROPOFOL;  Surgeon: Lollie Sails, MD;  Location: [facility] ENDOSCOPY;  Service: Endoscopy;  Laterality: N/A;  . ESOPHAGOGASTRODUODENOSCOPY (EGD) WITH PROPOFOL N/A 08/14/2015   Procedure: ESOPHAGOGASTRODUODENOSCOPY (EGD) WITH PROPOFOL;  Surgeon: Lollie Sails, MD;  Location: [facility] ENDOSCOPY;  Service: Endoscopy;  Laterality: N/A;  . WISDOM TOOTH EXTRACTION      Home Medications: Prior to Admission medications   Medication Sig Start Date End Date Taking? Authorizing Provider  albuterol (VENTOLIN HFA) 108 (90 Base) MCG/ACT inhaler Inhale 2 puffs into the lungs every 6 (six) hours as needed for wheezing or shortness of breath. 12/03/18  Yes Guse, Jacquelynn Cree, FNP  allopurinol (ZYLOPRIM) 100 MG tablet Take 1 tablet (100 mg total) by mouth 2 (two) times daily. 09/26/18  Yes Guse, Jacquelynn Cree, FNP  aspirin EC 81 MG tablet Take 1 tablet (81 mg total) by mouth daily. 10/05/17  Yes Dunn, Areta Haber, PA-C  doxycycline (VIBRAMYCIN) 100 MG capsule  03/21/19  Yes [provider]  Fluticasone-Salmeterol (ADVAIR DISKUS) 500-50 MCG/DOSE AEPB Inhale 1 puff into the lungs 2 (two) times daily. 10/06/17  Yes Lada, Satira Anis, MD  glucose blood test strip Use as instructed 05/09/19  Yes Guse, Jacquelynn Cree, FNP  metFORMIN (GLUCOPHAGE) 500 MG  tablet Take 1 tablet (500 mg total) by mouth daily before supper. 06/14/19  Yes Guse, Jacquelynn Cree, FNP  pantoprazole (PROTONIX) 40 MG tablet Take 1 tablet (40 mg total) by mouth daily. 12/26/18  Yes Guse, Jacquelynn Cree, FNP  potassium chloride SA (K-DUR,KLOR-CON) 20 MEQ tablet Take 1 tablet (20 mEq total) by mouth daily. 09/26/18  Yes Guse, Jacquelynn Cree, FNP  torsemide (DEMADEX) 10 MG tablet Take 2 TABLETS (20 mg total) by mouth 2 (two) times daily. 09/26/18  Yes Guse, Jacquelynn Cree, FNP  verapamil (CALAN-SR) 180 MG  CR tablet Take 1 tablet (180 mg total) by mouth 2 (two) times daily. 09/26/18  Yes Guse, Jacquelynn Cree, FNP    Allergies: No Known Allergies  Social History:  reports that he quit smoking about 7 years ago. His smoking use included cigarettes. He quit after 5.00 years of use. He has never used smokeless tobacco. He reports that he does not drink alcohol or use drugs.   Family History: Family History  Problem Relation Age of Onset  . Cancer Sister        lymphoma dx 70s; currently 20  . Diabetes Sister   . Cirrhosis Sister   . Colon cancer Paternal Uncle        dx 56s; deceased 43  . Prostate cancer Paternal Uncle        dx 65s; currently 9    Review of Systems: Review of Systems  Constitutional: Negative for chills and fever.  HENT: Negative for hearing loss.   Eyes: Negative for blurred vision.  Respiratory: Negative for shortness of breath.   Cardiovascular: Negative for chest pain.  Gastrointestinal: Negative for abdominal pain, nausea and vomiting.  Genitourinary: Negative for dysuria.  Musculoskeletal: Negative for myalgias.  Skin: Negative for rash.       Chronic drainage from left neck cyst  Neurological: Negative for dizziness.  Psychiatric/Behavioral: Negative for depression.    Physical Exam BP (!) 165/91   Pulse 67   Temp (!) 97.5 F (36.4 C) (Temporal)   Resp 12   Ht 5\' 11"  (1.803 m)   Wt (!) 405 lb (183.7 kg)   SpO2 92%   BMI 56.49 kg/m  CONSTITUTIONAL: No acute distress HEENT:  Normocephalic, atraumatic, extraocular motion intact. NECK: Trachea is midline, and there is no jugular venous distension.  RESPIRATORY:  Lungs are clear, and breath sounds are equal bilaterally. Normal respiratory effort without pathologic use of accessory muscles. CARDIOVASCULAR: Heart is regular without murmurs, gallops, or rubs. GI: The abdomen is soft, obese, non-distended, non-tender. MUSCULOSKELETAL:  Normal muscle strength and tone in all four extremities.  No peripheral  edema or cyanosis. SKIN: Left neck posterior to sternocleidomastoid muscle is a 2 mm draining sinus which is draining mostly serous to seropurulent fluid.  This tracks posteriorly about 1.5 to 2 cm to a sebaceous cyst.  The cyst seems to be about 1.5 cm in size.  The skin overlying is not erythematous or indurated.  There is no tenderness to palpation.  The patient also has several small skin tags in the left neck and shoulder.  There is a 2 cm sebaceous cyst in the right neck as well without any drainage, erythema, or induration. NEUROLOGIC:  Motor and sensation is grossly normal.  Cranial nerves are grossly intact. PSYCH:  Alert and oriented to person, place and time. Affect is normal.  Laboratory Analysis: No results found for this or any previous visit (from the past 24 hour(s)).  Imaging: No results found.  Assessment and Plan: This is a 53 y.o. male with a chronically infected left neck sebaceous cyst.  Discussed with the patient that we can do an I&D in the office of the infected cyst.  The incision would incorporate the draining sinus and tract.  Given the chronic infection, I would have to leave the wound open to heal by secondary intention.  Discussed that he would need daily dressing changes and perhaps more frequently to keep the area clean and dry as he works in [employer].  He would like to wait until next Wednesday 11/18 to do this procedure so he can take some vacation days and the weekend to start the healing process.  I think that's reasonable as there is currently no induration or erythema and does not warrant urgent I&D.  Discussed the procedure at length particularly dressing changes afterwards, healing time, risks of bleeding, infection, and injury to surrounding structures and he's willing to proceed.  Face-to-face time spent with the patient and care providers was 40 minutes, with more than 50% of the time spent counseling, educating, and coordinating care of the patient.      Melvyn Neth, [facility]

## 2019-06-26 ENCOUNTER — Other Ambulatory Visit: Payer: Self-pay

## 2019-06-26 ENCOUNTER — Other Ambulatory Visit: Payer: Self-pay | Admitting: Surgery

## 2019-06-26 ENCOUNTER — Ambulatory Visit (INDEPENDENT_AMBULATORY_CARE_PROVIDER_SITE_OTHER): Payer: BC Managed Care – PPO | Admitting: Surgery

## 2019-06-26 ENCOUNTER — Encounter: Payer: Self-pay | Admitting: Surgery

## 2019-06-26 VITALS — BP 160/74 | HR 79 | Ht 71.0 in | Wt >= 6400 oz

## 2019-06-26 DIAGNOSIS — L723 Sebaceous cyst: Secondary | ICD-10-CM | POA: Diagnosis not present

## 2019-06-26 DIAGNOSIS — L089 Local infection of the skin and subcutaneous tissue, unspecified: Secondary | ICD-10-CM

## 2019-06-26 NOTE — Progress Notes (Signed)
  Procedure Date:  06/26/2019  Pre-operative Diagnosis:  Chronically infected left neck sebaceous cyst  Post-operative Diagnosis:  Chronically infected left neck sebaceous cyst  Procedure:  Incision and Drainage and excision of left neck sebaceous cyst  Surgeon:  Melvyn Neth, MD  Assistant:  Merlinda Frederick, PA-S  Anesthesia:  8 ml of 1% lidocaine with epi  Estimated Blood Loss:  5 ml  Specimens:  Sebaceous cyst  Complications:  None  Indications for Procedure:  This is a 53 y.o. male with diagnosis of a chronically infected left neck sebaceous cyst, requiring drainage procedure.  Discussed with the patient that if feasible, we would also excise the cyst, but given the infection, would have to leave the wound open for drainage and healing by secondary intention.  The risks of bleeding, abscess or infection, injury to surrounding structures, and need for further procedures were all discussed with the patient and was willing to proceed.  Description of Procedure: The patient was correctly identified at bedside.  Appropriate time-outs were performed prior to procedure.  The patient's left neck was prepped and draped in usual sterile fashion.  Local anesthetic was infused intradermally.  A 4 cm incision was made over the cyst, revealing small amount of seropurulent fluid.  The patient had notable scarring around the cyst itself.  Skin flaps were created sharply with scalpel, and then the cyst was excised using combination of both scalpel and scissors.  Following excision, gauze was packed into the wound for hemostasis, which was good.  The wound was then irrigated and dried.  This was then packed with 1/2 inch strip gauze and dressed with 4x4 gauze and tape.    The patient tolerated the procedure well and all sharps were appropriately disposed of at the end of the case.  --Patient instructed that he can shower.  He can remove the gauze and packing dressing prior to showering, then dab  dry, then apply new packing and gauze.  He can do this daily and the outer gauze can be changed as needed to keep the area clean and dry. --May take tylenol and ibuprofen for pain control. --No new antibiotics needed at this point. --Follow up in two weeks for wound check.   Melvyn Neth, MD

## 2019-06-26 NOTE — Patient Instructions (Addendum)
Change packing daily. Return in two weeks.

## 2019-06-27 ENCOUNTER — Other Ambulatory Visit: Payer: Self-pay | Admitting: Surgery

## 2019-06-27 ENCOUNTER — Telehealth: Payer: Self-pay | Admitting: *Deleted

## 2019-06-27 MED ORDER — OXYCODONE HCL 5 MG PO TABS: 5.0000 mg | ORAL_TABLET | Freq: Four times a day (QID) | ORAL | 0 refills | Status: DC | PRN

## 2019-06-27 NOTE — Telephone Encounter (Signed)
Medication sent to pharmacy per Dr.Piscoya. Patient notified.

## 2019-06-27 NOTE — Telephone Encounter (Signed)
Patients wife called and stated that the procedure Michael Boyle had yesterday, they are suppose to pack the wound but she needs some instructions on how to do that. Please call and advise

## 2019-06-27 NOTE — Telephone Encounter (Signed)
Remove old packing, wash the area with soap and water, replace with new packing and cover with dry dressing and secure with tape. once daily.  Patient requesting pain medication.

## 2019-07-10 ENCOUNTER — Ambulatory Visit (INDEPENDENT_AMBULATORY_CARE_PROVIDER_SITE_OTHER): Payer: BC Managed Care – PPO | Admitting: Surgery

## 2019-07-10 ENCOUNTER — Encounter: Payer: Self-pay | Admitting: Surgery

## 2019-07-10 ENCOUNTER — Other Ambulatory Visit: Payer: Self-pay

## 2019-07-10 VITALS — BP 170/81 | HR 67 | Temp 97.9°F | Ht 71.0 in | Wt >= 6400 oz

## 2019-07-10 DIAGNOSIS — L723 Sebaceous cyst: Secondary | ICD-10-CM

## 2019-07-10 DIAGNOSIS — L089 Local infection of the skin and subcutaneous tissue, unspecified: Secondary | ICD-10-CM

## 2019-07-10 DIAGNOSIS — Z09 Encounter for follow-up examination after completed treatment for conditions other than malignant neoplasm: Secondary | ICD-10-CM

## 2019-07-10 NOTE — Progress Notes (Signed)
07/10/2019  HPI: EYDAN TURTON is a 53 y.o. male s/p I&D and excision of infected left neck sebaceous cyst on 11/18.  He has been doing packing dressing changes daily.  He initially needed oxycodone for pain control and dressing changes, but now he's doing well without.  No worsening pain, purulent drainage, or other concerns.  Vital signs: BP (!) 170/81   Pulse 67   Temp 97.9 F (36.6 C) (Temporal)   Ht 5\' 11"  (1.803 m)   Wt (!) 406 lb 12.8 oz (184.5 kg)   SpO2 94%   BMI 56.74 kg/m    Physical Exam: Constitutional: No acute distress Skin:  Left neck I&D site smaller, healing well, less deep compared to the surgery.  Getting 1/2 inch gauze packing.  This remained in place and external dressing changed.  No induration or erythema, no purulence.  Assessment/Plan: This is a 54 y.o. male s/p I&D and excision of left neck infected sebaceous cyst.  --Patient is healing well.  Continue packing dressing changes daily.  More gauze given.   --Follow up in two weeks for wound check.  Discussed that he would continue to be using less and less packing and once the wound is too superficial for any packing to stay in place, then can change to just superficial dressing.   Melvyn Neth, [facility]

## 2019-07-23 ENCOUNTER — Ambulatory Visit: Payer: BC Managed Care – PPO | Admitting: Physician Assistant

## 2019-07-23 ENCOUNTER — Other Ambulatory Visit: Payer: Self-pay

## 2019-07-23 ENCOUNTER — Encounter: Payer: Self-pay | Admitting: Physician Assistant

## 2019-07-23 VITALS — BP 161/112 | HR 67 | Temp 97.7°F | Ht 71.0 in | Wt >= 6400 oz

## 2019-07-23 DIAGNOSIS — Z09 Encounter for follow-up examination after completed treatment for conditions other than malignant neoplasm: Secondary | ICD-10-CM

## 2019-07-23 DIAGNOSIS — L723 Sebaceous cyst: Secondary | ICD-10-CM

## 2019-07-23 DIAGNOSIS — L089 Local infection of the skin and subcutaneous tissue, unspecified: Secondary | ICD-10-CM | POA: Diagnosis not present

## 2019-07-23 NOTE — Progress Notes (Signed)
[facility] POST-OP OFFICE VISIT  07/23/2019  HPI: Michael Boyle is a 53 y.o. male ~1 month s/p I&D of infected sebaceous cyst to the neck with Dr Hampton Abbot  Today he reports that since last Saturday he has no longer been backing his wound. He continues with superficial dressings. Scant amounts of serous drainage. No worsening erythema, pain, fever, or chills, no other complaints.   Vital signs: BP (!) 161/112   Pulse 67   Temp 97.7 F (36.5 C) (Temporal)   Ht 5\' 11"  (1.803 m)   Wt (!) 406 lb 11.2 oz (184.5 kg)   SpO2 94%   BMI 56.72 kg/m    Physical Exam: Constitutional: Well appearing male, NAD Skin: Incision to the left lateral-posterior neck is almost completely closed, there is a small amount of hypertrophic granulation tissue present, no surrounding erythema, non-tender  Assessment/Plan: This is a 53 y.o. male ~1 month s/p I&D of infected sebaceous cyst to the neck   - Offered silver nitrate to help with drainage but declined  - Continue dry dressing superficially daily + prn  - reviewed wound care  - reviewed return precautions and signs/symptoms of infection  - rtc prn  -- Edison Simon, PA-C [facility] 07/23/2019, 9:46 AM [phone] M-F: 7am - 4pm

## 2019-07-23 NOTE — Patient Instructions (Signed)
Patient may continue to use the dry dressing change daily.   Follow-up with our office as needed.  Please call and ask to speak with a nurse if you develop questions or concerns.

## 2019-07-24 ENCOUNTER — Encounter: Payer: BC Managed Care – PPO | Admitting: Surgery

## 2019-08-21 ENCOUNTER — Ambulatory Visit: Payer: BC Managed Care – PPO | Admitting: Family Medicine

## 2019-08-29 DIAGNOSIS — M25569 Pain in unspecified knee: Secondary | ICD-10-CM | POA: Insufficient documentation

## 2019-08-30 ENCOUNTER — Other Ambulatory Visit: Payer: Self-pay | Admitting: *Deleted

## 2019-08-30 DIAGNOSIS — M1A2720 Drug-induced chronic gout, left ankle and foot, without tophus (tophi): Secondary | ICD-10-CM

## 2019-08-30 DIAGNOSIS — M7989 Other specified soft tissue disorders: Secondary | ICD-10-CM

## 2019-08-30 DIAGNOSIS — I479 Paroxysmal tachycardia, unspecified: Secondary | ICD-10-CM

## 2019-08-30 DIAGNOSIS — E876 Hypokalemia: Secondary | ICD-10-CM

## 2019-08-30 DIAGNOSIS — T502X5A Adverse effect of carbonic-anhydrase inhibitors, benzothiadiazides and other diuretics, initial encounter: Secondary | ICD-10-CM

## 2019-08-30 MED ORDER — ALLOPURINOL 100 MG PO TABS: 100.0000 mg | ORAL_TABLET | Freq: Two times a day (BID) | ORAL | 0 refills | Status: DC

## 2019-08-30 MED ORDER — POTASSIUM CHLORIDE CRYS ER 20 MEQ PO TBCR: 20.0000 meq | EXTENDED_RELEASE_TABLET | Freq: Every day | ORAL | 0 refills | Status: DC

## 2019-08-30 MED ORDER — TORSEMIDE 10 MG PO TABS: ORAL_TABLET | ORAL | 0 refills | Status: DC

## 2019-08-30 MED ORDER — VERAPAMIL HCL ER 180 MG PO TBCR: 180.0000 mg | EXTENDED_RELEASE_TABLET | Freq: Two times a day (BID) | ORAL | 0 refills | Status: DC

## 2019-08-30 NOTE — Telephone Encounter (Signed)
Patient has scheduled TOC appointment on 10/04/19 last labs 06/07/19 ok to fill medications pended.

## 2019-10-04 ENCOUNTER — Ambulatory Visit (INDEPENDENT_AMBULATORY_CARE_PROVIDER_SITE_OTHER): Payer: BC Managed Care – PPO | Admitting: Family

## 2019-10-04 ENCOUNTER — Encounter: Payer: Self-pay | Admitting: Family

## 2019-10-04 VITALS — Ht 71.0 in | Wt >= 6400 oz

## 2019-10-04 DIAGNOSIS — I10 Essential (primary) hypertension: Secondary | ICD-10-CM | POA: Diagnosis not present

## 2019-10-04 DIAGNOSIS — J449 Chronic obstructive pulmonary disease, unspecified: Secondary | ICD-10-CM | POA: Diagnosis not present

## 2019-10-04 DIAGNOSIS — E119 Type 2 diabetes mellitus without complications: Secondary | ICD-10-CM

## 2019-10-04 DIAGNOSIS — E785 Hyperlipidemia, unspecified: Secondary | ICD-10-CM

## 2019-10-04 DIAGNOSIS — M7989 Other specified soft tissue disorders: Secondary | ICD-10-CM

## 2019-10-04 DIAGNOSIS — M25569 Pain in unspecified knee: Secondary | ICD-10-CM

## 2019-10-04 MED ORDER — ROSUVASTATIN CALCIUM 10 MG PO TABS: 10.0000 mg | ORAL_TABLET | Freq: Every day | ORAL | 3 refills | Status: DC

## 2019-10-04 NOTE — Assessment & Plan Note (Signed)
Stable.  Patient declines further work-up at this time including consult with vascular.  We will discuss again at follow-up.  Continue torsemide, potassium chloride .

## 2019-10-04 NOTE — Progress Notes (Signed)
Virtual Visit via Video Note  I connected with@  on 10/04/19 at  3:00 PM EST by a video enabled telemedicine application and verified that I am speaking with the correct person using two identifiers.  Location patient: home Location provider:work  Persons participating in the virtual visit: patient, provider  I discussed the limitations of evaluation and management by telemedicine and the availability of in person appointments. The patient expressed understanding and agreed to proceed.   HPI: Establish care No complaints.  DM- compliant with metformin  HLD- not on statin at this time. Had been on crestor in the past.   History of paroxysmal SVT - started on verapamil for 20+ years ago and since started verapamil. Cannot recall the last time he felt his heart race. Feels very controlled. seen ryan dunn 12/2017. Preferred to stay on verapamil; f/u 6 months   BLE edema- at baseline. worse in the evenings, back to normal in the mornings. Compliant with torsemide and KCl.  No wounds on legs.  COPD- will get winded with couple flights of stairs. No coughing, wheezing, CP.   Has been seen my Dr Quay Burow for knee pain. Has been on mobic 15mg .   ROS: See pertinent positives and negatives per HPI.  Past Medical History:  Diagnosis Date  . Colon polyps   . COPD (chronic obstructive pulmonary disease) ([place])   . Diabetes mellitus with complication ([place])   . Genetic testing 12/07/2017   Common Cancers panel (47 genes) @ [facility] - No pathogenic mutations detected  . GERD (gastroesophageal reflux disease)   . Gout   . Incomplete left bundle branch block   . Incomplete left bundle branch block (LBBB)    a. TTE 4/17: EF of 60-65%, unable to assess wall motion, normal LV diastolic function parameters, mildly calcifief mitral annulus  . Morbid obesity ([place])   . Palpitations    a. felt to be pSVT  . Palpitations   . Venous insufficiency     Past Surgical History:  Procedure Laterality Date   . COLONOSCOPY WITH PROPOFOL N/A 08/14/2015   Procedure: COLONOSCOPY WITH PROPOFOL;  Surgeon: Lollie Sails, MD;  Location: [facility] ENDOSCOPY;  Service: Endoscopy;  Laterality: N/A;  . COLONOSCOPY WITH PROPOFOL N/A 11/02/2017   Procedure: COLONOSCOPY WITH PROPOFOL;  Surgeon: Lollie Sails, MD;  Location: [facility] ENDOSCOPY;  Service: Endoscopy;  Laterality: N/A;  . ESOPHAGOGASTRODUODENOSCOPY (EGD) WITH PROPOFOL N/A 08/14/2015   Procedure: ESOPHAGOGASTRODUODENOSCOPY (EGD) WITH PROPOFOL;  Surgeon: Lollie Sails, MD;  Location: [facility] ENDOSCOPY;  Service: Endoscopy;  Laterality: N/A;  . WISDOM TOOTH EXTRACTION      Family History  Problem Relation Age of Onset  . Cancer Sister        lymphoma dx 22s; currently 54  . Diabetes Sister   . Cirrhosis Sister   . Colon cancer Paternal Uncle        dx 27s; deceased 84  . Prostate cancer Paternal Uncle        dx 35s; currently 94       Current Outpatient Medications:  .  albuterol (VENTOLIN HFA) 108 (90 Base) MCG/ACT inhaler, Inhale 2 puffs into the lungs every 6 (six) hours as needed for wheezing or shortness of breath., Disp: 1 Inhaler, Rfl: 2 .  allopurinol (ZYLOPRIM) 100 MG tablet, Take 1 tablet (100 mg total) by mouth 2 (two) times daily., Disp: 180 tablet, Rfl: 0 .  aspirin EC 81 MG tablet, Take 1 tablet (81 mg total) by mouth daily., Disp: 90  tablet, Rfl: 3 .  Fluticasone-Salmeterol (ADVAIR DISKUS) 500-50 MCG/DOSE AEPB, Inhale 1 puff into the lungs 2 (two) times daily., Disp: 60 each, Rfl: 5 .  glucose blood test strip, Use as instructed, Disp: 50 each, Rfl: 12 .  meloxicam (MOBIC) 15 MG tablet, Take 15 mg by mouth daily., Disp: , Rfl:  .  metFORMIN (GLUCOPHAGE) 500 MG tablet, Take 1 tablet (500 mg total) by mouth daily before supper., Disp: 90 tablet, Rfl: 0 .  pantoprazole (PROTONIX) 40 MG tablet, Take 1 tablet (40 mg total) by mouth daily., Disp: 90 tablet, Rfl: 3 .  potassium chloride SA (KLOR-CON) 20 MEQ tablet, Take 1 tablet (20 mEq  total) by mouth daily., Disp: 90 tablet, Rfl: 0 .  torsemide (DEMADEX) 10 MG tablet, Take 2 TABLETS (20 mg total) by mouth 2 (two) times daily., Disp: 360 tablet, Rfl: 0 .  verapamil (CALAN-SR) 180 MG CR tablet, Take 1 tablet (180 mg total) by mouth 2 (two) times daily., Disp: 180 tablet, Rfl: 0 .  rosuvastatin (CRESTOR) 10 MG tablet, Take 1 tablet (10 mg total) by mouth daily., Disp: 90 tablet, Rfl: 3  EXAM:  VITALS per patient if applicable: BP Readings from Last 3 Encounters:  07/23/19 (!) 161/112  07/10/19 (!) 170/81  06/26/19 (!) 160/74    GENERAL: alert, oriented, appears well and in no acute distress  HEENT: atraumatic, conjunttiva clear, no obvious abnormalities on inspection of external nose and ears  NECK: normal movements of the head and neck  LUNGS: on inspection no signs of respiratory distress, breathing rate appears normal, no obvious gross SOB, gasping or wheezing  CV: no obvious cyanosis  MS: moves all visible extremities without noticeable abnormality  PSYCH/NEURO: pleasant and cooperative, no obvious depression or anxiety, speech and thought processing grossly intact  ASSESSMENT AND PLAN:  Discussed the following assessment and plan:  Diabetes mellitus without complication (HCC) - Plan: Hemoglobin A1c  Benign essential hypertension - Plan: Comprehensive metabolic panel, rosuvastatin (CRESTOR) 10 MG tablet  Chronic obstructive pulmonary disease, unspecified COPD type (HCC)  Leg swelling  Knee pain, unspecified chronicity, unspecified laterality  Hyperlipidemia, unspecified hyperlipidemia type Problem List Items Addressed This Visit      Cardiovascular and Mediastinum   Benign essential hypertension    Appears elevated however patient states that these were times when he was in pain.  Discussed goal < 120/80.  He will monitor.  Follow-up in the office to be can check blood pressure as well      Relevant Medications   rosuvastatin (CRESTOR) 10 MG  tablet   Other Relevant Orders   Comprehensive metabolic panel     Respiratory   COPD (chronic obstructive pulmonary disease) (HCC)    Stable. Appears at baseline.  Continue to follow        Endocrine   Diabetes mellitus without complication ([place]) - Primary    Stable, pending A1c.  Advise goal closer to 6.5      Relevant Medications   rosuvastatin (CRESTOR) 10 MG tablet   Other Relevant Orders   Hemoglobin A1c     Other   HLD (hyperlipidemia)    Uncontrolled.  Discussed goal of LDL less than 70.  Patient will start Crestor and repeat CMP in 6 weeks      Relevant Medications   rosuvastatin (CRESTOR) 10 MG tablet   Knee pain    Stable, chronic.  He has been following with [facility] for this.  Advised to use meloxicam only as needed versus every  day.  Advised this medication may be increasing his blood pressure or could contribute to kidney damage.  Patient voiced understanding of all      Leg swelling    Stable.  Patient declines further work-up at this time including consult with vascular.  We will discuss again at follow-up.  Continue torsemide, potassium chloride .         -we discussed possible serious and likely etiologies, options for evaluation and workup, limitations of telemedicine visit vs in person visit, treatment, treatment risks and precautions. Pt prefers to treat via telemedicine empirically rather then risking or undertaking an in person visit at this moment. Patient agrees to seek prompt in person care if worsening, new symptoms arise, or if is not improving with treatment.   I discussed the assessment and treatment plan with the patient. The patient was provided an opportunity to ask questions and all were answered. The patient agreed with the plan and demonstrated an understanding of the instructions.   The patient was advised to call back or seek an in-person evaluation if the symptoms worsen or if the condition fails to improve as  anticipated.   Mable Paris, FNP

## 2019-10-04 NOTE — Assessment & Plan Note (Signed)
Uncontrolled.  Discussed goal of LDL less than 70.  Patient will start Crestor and repeat CMP in 6 weeks

## 2019-10-04 NOTE — Assessment & Plan Note (Signed)
Stable, pending A1c.  Advise goal closer to 6.5

## 2019-10-04 NOTE — Assessment & Plan Note (Signed)
Stable, chronic.  He has been following with [facility] for this.  Advised to use meloxicam only as needed versus every day.  Advised this medication may be increasing his blood pressure or could contribute to kidney damage.  Patient voiced understanding of all

## 2019-10-04 NOTE — Assessment & Plan Note (Addendum)
Appears elevated however patient states that these were times when he was in pain.  Discussed goal < 120/80.  He will monitor.  Follow-up in the office to be can check blood pressure as well

## 2019-10-04 NOTE — Assessment & Plan Note (Signed)
Stable. Appears at baseline.  Continue to follow

## 2019-11-15 ENCOUNTER — Other Ambulatory Visit: Payer: Self-pay | Admitting: Family

## 2019-11-15 ENCOUNTER — Telehealth: Payer: Self-pay

## 2019-11-15 ENCOUNTER — Other Ambulatory Visit: Payer: Self-pay

## 2019-11-15 ENCOUNTER — Other Ambulatory Visit (INDEPENDENT_AMBULATORY_CARE_PROVIDER_SITE_OTHER): Payer: BC Managed Care – PPO

## 2019-11-15 DIAGNOSIS — E119 Type 2 diabetes mellitus without complications: Secondary | ICD-10-CM

## 2019-11-15 DIAGNOSIS — I10 Essential (primary) hypertension: Secondary | ICD-10-CM

## 2019-11-15 DIAGNOSIS — E876 Hypokalemia: Secondary | ICD-10-CM

## 2019-11-15 LAB — COMPREHENSIVE METABOLIC PANEL
ALT: 17 U/L (ref 0–53)
AST: 17 U/L (ref 0–37)
Albumin: 4.3 g/dL (ref 3.5–5.2)
Alkaline Phosphatase: 73 U/L (ref 39–117)
BUN: 12 mg/dL (ref 6–23)
CO2: 31 mEq/L (ref 19–32)
Calcium: 8.9 mg/dL (ref 8.4–10.5)
Chloride: 97 mEq/L (ref 96–112)
Creatinine, Ser: 0.95 mg/dL (ref 0.40–1.50)
GFR: 82.39 mL/min (ref >60.00)
Glucose, Bld: 124 mg/dL — ABNORMAL HIGH (ref 70–99)
Potassium: 3.2 mEq/L — ABNORMAL LOW (ref 3.5–5.1)
Sodium: 138 mEq/L (ref 135–145)
Total Bilirubin: 0.5 mg/dL (ref 0.2–1.2)
Total Protein: 7.9 g/dL (ref 6.0–8.3)

## 2019-11-15 LAB — HEMOGLOBIN A1C: Hgb A1c MFr Bld: 7.1 % — ABNORMAL HIGH (ref 4.6–6.5)

## 2019-11-15 MED ORDER — POTASSIUM CHLORIDE CRYS ER 20 MEQ PO TBCR: EXTENDED_RELEASE_TABLET | ORAL | 1 refills | Status: DC

## 2019-11-15 MED ORDER — METFORMIN HCL 500 MG PO TABS: 1000.0000 mg | ORAL_TABLET | Freq: Two times a day (BID) | ORAL | 0 refills | Status: DC

## 2019-11-15 NOTE — Telephone Encounter (Signed)
LMTCB patient needs to call back TODAY for labs.

## 2019-11-22 ENCOUNTER — Telehealth: Payer: Self-pay | Admitting: Family

## 2019-11-22 DIAGNOSIS — J449 Chronic obstructive pulmonary disease, unspecified: Secondary | ICD-10-CM

## 2019-11-22 MED ORDER — FLUTICASONE-SALMETEROL 500-50 MCG/DOSE IN AEPB: INHALATION_SPRAY | RESPIRATORY_TRACT | 5 refills | Status: DC

## 2019-11-22 NOTE — Telephone Encounter (Signed)
Pt is needing a refill on advair diskus. Pt has been out all week said he called it into [place] last week and they told him they haven't heard anything from [place].

## 2019-11-29 ENCOUNTER — Other Ambulatory Visit (INDEPENDENT_AMBULATORY_CARE_PROVIDER_SITE_OTHER): Payer: BC Managed Care – PPO

## 2019-11-29 ENCOUNTER — Other Ambulatory Visit: Payer: Self-pay

## 2019-11-29 DIAGNOSIS — I10 Essential (primary) hypertension: Secondary | ICD-10-CM

## 2019-11-29 LAB — BASIC METABOLIC PANEL
BUN: 10 mg/dL (ref 6–23)
CO2: 32 mEq/L (ref 19–32)
Calcium: 9 mg/dL (ref 8.4–10.5)
Chloride: 98 mEq/L (ref 96–112)
Creatinine, Ser: 0.96 mg/dL (ref 0.40–1.50)
GFR: 81.39 mL/min (ref >60.00)
Glucose, Bld: 125 mg/dL — ABNORMAL HIGH (ref 70–99)
Potassium: 4.2 mEq/L (ref 3.5–5.1)
Sodium: 137 mEq/L (ref 135–145)

## 2019-12-02 ENCOUNTER — Ambulatory Visit
Admission: RE | Admit: 2019-12-02 | Discharge: 2019-12-02 | Disposition: A | Discharge status: Home / Self Care | Payer: BC Managed Care – PPO | Source: Ambulatory Visit | Attending: Family | Admitting: Family

## 2019-12-02 ENCOUNTER — Ambulatory Visit: Payer: BC Managed Care – PPO

## 2019-12-02 ENCOUNTER — Other Ambulatory Visit: Payer: Self-pay

## 2019-12-02 ENCOUNTER — Encounter: Payer: Self-pay | Admitting: Family

## 2019-12-02 ENCOUNTER — Ambulatory Visit: Payer: BC Managed Care – PPO | Admitting: Family

## 2019-12-02 VITALS — BP 120/64 | HR 81 | Temp 97.6°F | Ht 70.25 in | Wt >= 6400 oz

## 2019-12-02 DIAGNOSIS — J449 Chronic obstructive pulmonary disease, unspecified: Secondary | ICD-10-CM

## 2019-12-02 DIAGNOSIS — I10 Essential (primary) hypertension: Secondary | ICD-10-CM

## 2019-12-02 DIAGNOSIS — G4733 Obstructive sleep apnea (adult) (pediatric): Secondary | ICD-10-CM

## 2019-12-02 DIAGNOSIS — M7989 Other specified soft tissue disorders: Secondary | ICD-10-CM | POA: Insufficient documentation

## 2019-12-02 DIAGNOSIS — E119 Type 2 diabetes mellitus without complications: Secondary | ICD-10-CM

## 2019-12-02 NOTE — Assessment & Plan Note (Addendum)
Suspect slow worsening of shortness of breath is related to COPD, also discussed obesity, deconditioning with patient as well.  Discussed the role of allergies as perhaps contributory.  Chest x-ray does not reveal any acute findings today.  Echocardiogram 2019 does not show reduced ejection fraction.  He does not appear volume overloaded today Patient remain vigilant and he will trial over-the-counter antihistamine, he will also try to premedicate with albuterol prior to increased activity.  Referral to pulmonology for formal evaluation Ordered CT lung cancer screening this patient qualifies a age 54 yo.

## 2019-12-02 NOTE — Patient Instructions (Addendum)
Trial taking crestor once per week Please call back [facility] cardiology and make follow up with Christell Faith as discussed today  Ordered CT chest for lung cancer screening, ultrasound of the legs, pulmonology referral and colonoscopy.  Let us know if you dont hear back within a week in regards to an appointment being scheduled.

## 2019-12-02 NOTE — Assessment & Plan Note (Signed)
Compliant with CPAP 

## 2019-12-02 NOTE — Assessment & Plan Note (Signed)
Suspect improved.  Continue Metformin

## 2019-12-02 NOTE — Assessment & Plan Note (Signed)
Controlled, continue current regimen

## 2019-12-02 NOTE — Progress Notes (Signed)
Subjective:    Patient ID: Michael Boyle, male    DOB: [birth year], 54 y.o.   MRN: [id number]  CC: [name] is a 54 y.o. male who presents today for follow up.   HPI: Complains of increased shortness of breath slowly over the past few months.  He does note that he has been less active in the past few months.  He has gained approximately 10 pounds.   he notices the shortness of breath when he is carrying things or going upstairs.  Resolves with rest.  Shortness of breath with sitting.  No orthopnea, cough, fever, nasal congestion.  He does feel like the shortness of breath is worse this time of year.  He is not sure if allergies are related.  He states that hot temperatures make shortness of breath worse.  No recent surgery or immobilization.  No history of DVT. He continues to have lower extremity bilateral swelling.  Occasionally he notices his left calf is worse.  No increased warmth, erythema or rash. Lower extremity swelling improved with compression socks in the morning and states that he has "chicken legs".  As the day goes on, lower extremity swelling will worsen.  Improves with walking, worse with sitting.  He has seen [facility] and vascular in the past.  Hypertension-compliant medications.  No chest pain.  Diabetes-compliant with 500 mg twice daily.  No numbness or tingling in his feet.  Hyperlipidemia-compliant with Crestor.  Does feel like arms and legs have been more achy since starting medication.  Sleep apnea- compliant with his CPAP Quit smoking 9 years ago.  Echocardiogram 2019 left ventricular ejection fraction 55 to 65% Due for colonoscopy HISTORY:  Past Medical History:  Diagnosis Date  . Colon polyps   . COPD (chronic obstructive pulmonary disease) ([place])   . Diabetes mellitus with complication ([place])   . Genetic testing 12/07/2017   Common Cancers panel (47 genes) @ [facility] - No pathogenic mutations detected  . GERD (gastroesophageal reflux disease)   .  Gout   . Incomplete left bundle branch block   . Incomplete left bundle branch block (LBBB)    a. TTE 4/17: EF of 60-65%, unable to assess wall motion, normal LV diastolic function parameters, mildly calcifief mitral annulus  . Morbid obesity ([place])   . Palpitations    a. felt to be pSVT  . Palpitations   . Venous insufficiency    Past Surgical History:  Procedure Laterality Date  . COLONOSCOPY WITH PROPOFOL N/A 08/14/2015   Procedure: COLONOSCOPY WITH PROPOFOL;  Surgeon: Lollie Sails, MD;  Location: [facility] ENDOSCOPY;  Service: Endoscopy;  Laterality: N/A;  . COLONOSCOPY WITH PROPOFOL N/A 11/02/2017   Procedure: COLONOSCOPY WITH PROPOFOL;  Surgeon: Lollie Sails, MD;  Location: [facility] ENDOSCOPY;  Service: Endoscopy;  Laterality: N/A;  . ESOPHAGOGASTRODUODENOSCOPY (EGD) WITH PROPOFOL N/A 08/14/2015   Procedure: ESOPHAGOGASTRODUODENOSCOPY (EGD) WITH PROPOFOL;  Surgeon: Lollie Sails, MD;  Location: [facility] ENDOSCOPY;  Service: Endoscopy;  Laterality: N/A;  . WISDOM TOOTH EXTRACTION     Family History  Problem Relation Age of Onset  . Cancer Sister        lymphoma dx 88s; currently 23  . Diabetes Sister   . Cirrhosis Sister   . Colon cancer Paternal Uncle        dx 58s; deceased 8  . Prostate cancer Paternal Uncle        dx 31s; currently 64    Allergies: Patient has no known allergies. Current Outpatient  Medications on File Prior to Visit  Medication Sig Dispense Refill  . albuterol (VENTOLIN HFA) 108 (90 Base) MCG/ACT inhaler Inhale 2 puffs into the lungs every 6 (six) hours as needed for wheezing or shortness of breath. 1 Inhaler 2  . allopurinol (ZYLOPRIM) 100 MG tablet Take 1 tablet (100 mg total) by mouth 2 (two) times daily. 180 tablet 0  . aspirin EC 81 MG tablet Take 1 tablet (81 mg total) by mouth daily. 90 tablet 3  . Fluticasone-Salmeterol (ADVAIR DISKUS) 500-50 MCG/DOSE AEPB Inhale 1 puff into the lungs 2 (two) times daily. 60 each 5  . glucose blood test strip Use  as instructed 50 each 12  . metFORMIN (GLUCOPHAGE) 500 MG tablet Take 2 tablets (1,000 mg total) by mouth 2 (two) times daily with a meal. 180 tablet 0  . pantoprazole (PROTONIX) 40 MG tablet Take 1 tablet (40 mg total) by mouth daily. 90 tablet 3  . potassium chloride SA (KLOR-CON) 20 MEQ tablet Next two days take one tablet twice per day. Following take 1 tablet twice daily on Tuesday, Thursday, Saturday & Sunday. Take one tablet daily on Monday, Wednesday & Friday. 44 tablet 1  . rosuvastatin (CRESTOR) 10 MG tablet Take 1 tablet (10 mg total) by mouth daily. 90 tablet 3  . torsemide (DEMADEX) 10 MG tablet Take 2 TABLETS (20 mg total) by mouth 2 (two) times daily. 360 tablet 0  . verapamil (CALAN-SR) 180 MG CR tablet Take 1 tablet (180 mg total) by mouth 2 (two) times daily. 180 tablet 0   No current facility-administered medications on file prior to visit.    Social History   Tobacco Use  . Smoking status: Former Smoker    Years: 5.00    Types: Cigarettes    Quit date: 06/13/2012    Years since quitting: 7.4  . Smokeless tobacco: Never Used  Substance Use Topics  . Alcohol use: No    Alcohol/week: 0.0 standard drinks  . Drug use: No    Review of Systems  Constitutional: Negative for chills and fever.  HENT: Negative for sinus pressure, tinnitus and trouble swallowing.   Respiratory: Positive for shortness of breath. Negative for cough and wheezing.   Cardiovascular: Positive for leg swelling. Negative for chest pain and palpitations.  Gastrointestinal: Negative for nausea and vomiting.  Skin: Negative for rash and wound.      Objective:    BP 120/64   Pulse 81   Temp 97.6 F (36.4 C) (Temporal)   Ht 5' 10.25" (1.784 m)   Wt (!) 410 lb 9.6 oz (186.2 kg)   SpO2 96%   BMI 58.50 kg/m  BP Readings from Last 3 Encounters:  12/02/19 120/64  07/23/19 (!) 161/112  07/10/19 (!) 170/81   Wt Readings from Last 3 Encounters:  12/02/19 (!) 410 lb 9.6 oz (186.2 kg)  10/04/19  (!) 400 lb (181.4 kg)  07/23/19 (!) 406 lb 11.2 oz (184.5 kg)    Physical Exam Vitals reviewed.  Constitutional:      Appearance: He is well-developed.  Cardiovascular:     Rate and Rhythm: Regular rhythm.     Heart sounds: Normal heart sounds.     Comments: Bilateral nonpitting trace pedal edema. Patient is wearing Compression stockings No increased warmth, erythema. L calf is slightly larger than right. Pulmonary:     Effort: Pulmonary effort is normal. No respiratory distress.     Breath sounds: Examination of the right-lower field reveals decreased breath sounds. Examination  of the left-lower field reveals decreased breath sounds. Decreased breath sounds present. No wheezing, rhonchi or rales.  Musculoskeletal:     Right lower leg: Edema present.     Left lower leg: Edema present.  Skin:    General: Skin is warm and dry.  Neurological:     Mental Status: He is alert.  Psychiatric:        Speech: Speech normal.        Behavior: Behavior normal.        Assessment & Plan:   Problem List Items Addressed This Visit      Cardiovascular and Mediastinum   Benign essential hypertension - Primary    Controlled, continue current regimen.        Respiratory   COPD (chronic obstructive pulmonary disease) (HCC)    Suspect slow worsening of shortness of breath is related to COPD, also discussed obesity, deconditioning with patient as well.  Discussed the role of allergies as perhaps contributory.  Chest x-ray does not reveal any acute findings today.  Echocardiogram 2019 does not show reduced ejection fraction.  He does not appear volume overloaded today Patient remain vigilant and he will trial over-the-counter antihistamine, he will also try to premedicate with albuterol prior to increased activity.  Referral to pulmonology for formal evaluation Ordered CT lung cancer screening this patient qualifies a age 28 yo.      Relevant Orders   Ambulatory referral to Gastroenterology     CT CHEST LUNG CA SCREEN LOW DOSE W/O CM   Ambulatory referral to Pulmonology   DG Chest 2 View (Completed)   Obstructive apnea    Compliant with CPAP.        Endocrine   Diabetes mellitus without complication ([place])    Suspect improved.  Continue Metformin        Other   Leg swelling    Improved with elevation, compression stockings, torsemide.  Ultrasound bilaterally negative for DVT. we will continue regimen for now      Relevant Orders   Basic metabolic panel   US Venous Img Lower Bilateral (DVT) (Completed)       I have discontinued Craigory L. Narang's meloxicam. I am also having him maintain his aspirin EC, albuterol, pantoprazole, glucose blood, verapamil, allopurinol, torsemide, rosuvastatin, metFORMIN, potassium chloride SA, and Fluticasone-Salmeterol.   No orders of the defined types were placed in this encounter.   Return precautions given.   Risks, benefits, and alternatives of the medications and treatment plan prescribed today were discussed, and patient expressed understanding.   Education regarding symptom management and diagnosis given to patient on AVS.  Continue to follow with Burnard Hawthorne, FNP for routine health maintenance.   Michael Boyle and I agreed with plan.   Mable Paris, FNP

## 2019-12-02 NOTE — Assessment & Plan Note (Signed)
Improved with elevation, compression stockings, torsemide.  Ultrasound bilaterally negative for DVT. we will continue regimen for now

## 2019-12-03 ENCOUNTER — Telehealth: Payer: Self-pay | Admitting: Family

## 2019-12-03 DIAGNOSIS — Z1211 Encounter for screening for malignant neoplasm of colon: Secondary | ICD-10-CM

## 2019-12-03 NOTE — Telephone Encounter (Signed)
Rejection Reason - Unapproved Service - Reason for referral is "Chronic obstructive pulmonary disease, unspecified COPD type ([place]". Attached documents indicate reason for referral is colonoscopy with a diagnosis of COPD. Does patient need to see Gertie Fey for a colonoscopy or Pulmonology for COPD? Please advise/update/correct referral. Thanks!" [facility] said about 23 hours ago  Please advise Thank you!

## 2019-12-04 NOTE — Addendum Note (Signed)
Addended by: Burnard Hawthorne on: 12/04/2019 12:59 PM   Modules accepted: Orders

## 2019-12-04 NOTE — Telephone Encounter (Signed)
Sorry for confusion Yes he has a referral to pulmonology for COPD.    I separately made a referral to gastroenterology as patient is due for colonoscopy.Please ensure GI is aware that he needs colonoscopy.

## 2019-12-04 NOTE — Telephone Encounter (Signed)
See Sarah for explanation. Thank you!

## 2019-12-04 NOTE — Telephone Encounter (Signed)
No prob Reordered colonoscopy and should work now ty

## 2019-12-05 ENCOUNTER — Other Ambulatory Visit: Payer: Self-pay | Admitting: *Deleted

## 2019-12-05 ENCOUNTER — Telehealth: Payer: Self-pay | Admitting: *Deleted

## 2019-12-05 NOTE — Telephone Encounter (Signed)
Thank you :)

## 2019-12-05 NOTE — Telephone Encounter (Signed)
Contacted and reviewed criteria for lung cancer screenings. Patient requests to wait until insurance will cover to have lung screening scan. Message sent to business office to inquire about coverage prior to patients birthday in July. Will contact at next birthday if not before. Patient is in agreement with this plan.

## 2019-12-12 ENCOUNTER — Telehealth (INDEPENDENT_AMBULATORY_CARE_PROVIDER_SITE_OTHER): Payer: Self-pay | Admitting: Gastroenterology

## 2019-12-12 DIAGNOSIS — D126 Benign neoplasm of colon, unspecified: Secondary | ICD-10-CM

## 2019-12-12 NOTE — Progress Notes (Signed)
Gastroenterology Pre-Procedure Review  Request Date: 01/31/20 Requesting Physician: Dr. Marius Ditch  PATIENT REVIEW QUESTIONS: The patient responded to the following health history questions as indicated:    1. Are you having any GI issues? no 2. Do you have a personal history of Polyps? yes (2019 with Dr. Gustavo Lah) 3. Do you have a family history of Colon Cancer or Polyps? no 4. Diabetes Mellitus? yes (oral meds) 5. Joint replacements in the past 12 months?no 6. Major health problems in the past 3 months?no 7. Any artificial heart valves, MVP, or defibrillator?no    MEDICATIONS & ALLERGIES:    Patient reports the following regarding taking any anticoagulation/antiplatelet therapy:   Plavix, Coumadin, Eliquis, Xarelto, Lovenox, Pradaxa, Brilinta, or Effient? no Aspirin? yes (81 mg daily)  Patient confirms/reports the following medications:  Current Outpatient Medications  Medication Sig Dispense Refill  . albuterol (VENTOLIN HFA) 108 (90 Base) MCG/ACT inhaler Inhale 2 puffs into the lungs every 6 (six) hours as needed for wheezing or shortness of breath. 1 Inhaler 2  . allopurinol (ZYLOPRIM) 100 MG tablet Take 1 tablet (100 mg total) by mouth 2 (two) times daily. 180 tablet 0  . aspirin EC 81 MG tablet Take 1 tablet (81 mg total) by mouth daily. 90 tablet 3  . Fluticasone-Salmeterol (ADVAIR DISKUS) 500-50 MCG/DOSE AEPB Inhale 1 puff into the lungs 2 (two) times daily. 60 each 5  . glucose blood test strip Use as instructed 50 each 12  . metFORMIN (GLUCOPHAGE) 500 MG tablet Take 2 tablets (1,000 mg total) by mouth 2 (two) times daily with a meal. 180 tablet 0  . pantoprazole (PROTONIX) 40 MG tablet Take 1 tablet (40 mg total) by mouth daily. 90 tablet 3  . potassium chloride SA (KLOR-CON) 20 MEQ tablet Next two days take one tablet twice per day. Following take 1 tablet twice daily on Tuesday, Thursday, Saturday & Sunday. Take one tablet daily on Monday, Wednesday & Friday. 44 tablet 1  .  rosuvastatin (CRESTOR) 10 MG tablet Take 1 tablet (10 mg total) by mouth daily. 90 tablet 3  . torsemide (DEMADEX) 10 MG tablet Take 2 TABLETS (20 mg total) by mouth 2 (two) times daily. 360 tablet 0  . verapamil (CALAN-SR) 180 MG CR tablet Take 1 tablet (180 mg total) by mouth 2 (two) times daily. 180 tablet 0   No current facility-administered medications for this visit.    Patient confirms/reports the following allergies:  No Known Allergies  No orders of the defined types were placed in this encounter.   AUTHORIZATION INFORMATION Primary Insurance: 1D#: Group #:  Secondary Insurance: 1D#: Group #:  SCHEDULE INFORMATION: Date: 01/31/20 Time: Location:[facility]

## 2019-12-27 ENCOUNTER — Other Ambulatory Visit: Payer: Self-pay | Admitting: Family Medicine

## 2019-12-27 ENCOUNTER — Telehealth: Payer: Self-pay | Admitting: Pulmonary Disease

## 2019-12-27 ENCOUNTER — Encounter: Payer: Self-pay | Admitting: Pulmonary Disease

## 2019-12-27 ENCOUNTER — Ambulatory Visit: Payer: BC Managed Care – PPO | Admitting: Pulmonary Disease

## 2019-12-27 ENCOUNTER — Other Ambulatory Visit: Payer: Self-pay

## 2019-12-27 VITALS — BP 142/86 | HR 84 | Temp 98.1°F | Ht 71.0 in | Wt >= 6400 oz

## 2019-12-27 DIAGNOSIS — R0609 Other forms of dyspnea: Secondary | ICD-10-CM

## 2019-12-27 DIAGNOSIS — R0600 Dyspnea, unspecified: Secondary | ICD-10-CM

## 2019-12-27 DIAGNOSIS — I479 Paroxysmal tachycardia, unspecified: Secondary | ICD-10-CM

## 2019-12-27 DIAGNOSIS — M1A2720 Drug-induced chronic gout, left ankle and foot, without tophus (tophi): Secondary | ICD-10-CM

## 2019-12-27 DIAGNOSIS — M7989 Other specified soft tissue disorders: Secondary | ICD-10-CM

## 2019-12-27 MED ORDER — PREDNISONE 10 MG PO TABS: 10.0000 mg | ORAL_TABLET | Freq: Two times a day (BID) | ORAL | 0 refills | Status: DC

## 2019-12-27 MED ORDER — PREDNISONE 10 MG PO TABS
10.0000 mg | ORAL_TABLET | Freq: Every day | ORAL | 0 refills | Status: DC
Start: 2019-12-27 — End: 2020-01-03

## 2019-12-27 MED ORDER — TRELEGY ELLIPTA 200-62.5-25 MCG/INH IN AEPB: 1.0000 | INHALATION_SPRAY | Freq: Every day | RESPIRATORY_TRACT | 0 refills | Status: DC

## 2019-12-27 MED ORDER — TRELEGY ELLIPTA 200-62.5-25 MCG/INH IN AEPB: 1.0000 | INHALATION_SPRAY | Freq: Every day | RESPIRATORY_TRACT | 2 refills | Status: DC

## 2019-12-27 NOTE — Patient Instructions (Signed)
History of chronic obstructive disease with increasing shortness of breath  COPD exacerbation  Switch to Trelegy from Advair Prednisone 10 mg p.o. twice daily for 7 days   Obtain echocardiogram for shortness of breath on exertion Obtain pulmonary function tests   Call with significant concerns  Follow-up in 3 months

## 2019-12-27 NOTE — Telephone Encounter (Signed)
Spoke with pharmacist, Margarita Grizzle and advised to please start pt on the pred bid x 7 days rx per avs   History of chronic obstructive disease with increasing shortness of breath   COPD exacerbation   Switch to Trelegy from Advair Prednisone 10 mg p.o. twice daily for 7 days     Obtain echocardiogram for shortness of breath on exertion Obtain pulmonary function tests     Call with significant concerns   Follow-up in 3 months

## 2019-12-27 NOTE — Progress Notes (Signed)
Michael Boyle    [id number]    01/30/1966  Primary Care Physician:Arnett, Yvetta Coder, FNP  Referring Physician: Burnard Hawthorne, FNP [address] 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 albuterol (VENTOLIN HFA) 108 (90 Base) MCG/ACT inhaler Inhale 2 puffs into the lungs every 6 (six) hours as needed for wheezing or shortness of breath.  . allopurinol (ZYLOPRIM) 100 MG tablet Take 1 tablet (100 mg total) by mouth 2 (two) times daily.  Marland Kitchen aspirin EC 81 MG tablet Take 1 tablet (81 mg total) by mouth daily.  Marland Kitchen glucose blood test strip Use as instructed  . metFORMIN (GLUCOPHAGE) 500 MG tablet Take 2 tablets (1,000 mg total) by mouth 2 (two) times daily with a meal.  . pantoprazole (PROTONIX) 40 MG tablet Take 1 tablet (40 mg total) by mouth daily.  . potassium chloride SA (KLOR-CON) 20 MEQ tablet Next two days take one tablet twice per day. Following take 1 tablet twice daily on Tuesday, Thursday, Saturday & Sunday. Take one tablet daily on Monday, Wednesday & Friday.  . rosuvastatin (CRESTOR) 10 MG tablet Take 1 tablet (10 mg total) by mouth daily.  Marland Kitchen torsemide (DEMADEX)  10 MG tablet Take 2 TABLETS (20 mg total) by mouth 2 (two) times daily.  . verapamil (CALAN-SR) 180 MG CR tablet Take 1 tablet (180 mg total) by mouth 2 (two) times daily.  . [DISCONTINUED] Fluticasone-Salmeterol (ADVAIR DISKUS) 500-50 MCG/DOSE AEPB Inhale 1 puff into the lungs 2 (two) times daily.  . Fluticasone-Umeclidin-Vilant (TRELEGY ELLIPTA) 200-62.5-25 MCG/INH AEPB Inhale 1 puff into the lungs daily.  . predniSONE (DELTASONE) 10 MG tablet Take 1 tablet (10 mg total) by mouth daily with breakfast.   No facility-administered encounter medications on file as of 12/27/2019.    Allergies as of 12/27/2019  . (No Known Allergies)    Past Medical History:  Diagnosis Date  . Colon polyps   . COPD (chronic obstructive pulmonary disease) ([place])   . Diabetes mellitus with complication ([place])   . Genetic testing 12/07/2017   Common Cancers panel (47 genes) @ [facility] - No pathogenic mutations detected  . GERD (gastroesophageal reflux disease)   . Gout   . Incomplete left bundle branch block   . Incomplete left bundle branch block (LBBB)    a. TTE 4/17: EF of 60-65%, unable to assess wall motion, normal LV diastolic function parameters, mildly calcifief mitral annulus  . Morbid obesity ([place])   . Palpitations    a. felt to be pSVT  . Palpitations   . Venous insufficiency  Past Surgical History:  Procedure Laterality Date  . COLONOSCOPY WITH PROPOFOL N/A 08/14/2015   Procedure: COLONOSCOPY WITH PROPOFOL;  Surgeon: Lollie Sails, MD;  Location: [facility] ENDOSCOPY;  Service: Endoscopy;  Laterality: N/A;  . COLONOSCOPY WITH PROPOFOL N/A 11/02/2017   Procedure: COLONOSCOPY WITH PROPOFOL;  Surgeon: Lollie Sails, MD;  Location: [facility] ENDOSCOPY;  Service: Endoscopy;  Laterality: N/A;  . ESOPHAGOGASTRODUODENOSCOPY (EGD) WITH PROPOFOL N/A 08/14/2015   Procedure: ESOPHAGOGASTRODUODENOSCOPY (EGD) WITH PROPOFOL;  Surgeon: Lollie Sails, MD;  Location: [facility] ENDOSCOPY;  Service: Endoscopy;   Laterality: N/A;  . WISDOM TOOTH EXTRACTION      Family History  Problem Relation Age of Onset  . Cancer Sister        lymphoma dx 18s; currently 72  . Diabetes Sister   . Cirrhosis Sister   . Colon cancer Paternal Uncle        dx 35s; deceased 48  . Prostate cancer Paternal Uncle        dx 75s; currently 66    Social History   Socioeconomic History  . Marital status: Married    Spouse name: Not on file  . Number of children: Not on file  . Years of education: Not on file  . Highest education level: Not on file  Occupational History  . Not on file  Tobacco Use  . Smoking status: Former Smoker    Years: 5.00    Types: Cigarettes    Quit date: 06/13/2012    Years since quitting: 7.5  . Smokeless tobacco: Never Used  Substance and Sexual Activity  . Alcohol use: No    Alcohol/week: 0.0 standard drinks  . Drug use: No  . Sexual activity: Not on file  Other Topics Concern  . Not on file  Social History Narrative  . Not on file   Social Determinants of Health   Financial Resource Strain:   . Difficulty of Paying Living Expenses:   Food Insecurity:   . Worried About Charity fundraiser in the Last Year:   . Arboriculturist in the Last Year:   Transportation Needs:   . Film/video editor (Medical):   Marland Kitchen Lack of Transportation (Non-Medical):   Physical Activity:   . Days of Exercise per Week:   . Minutes of Exercise per Session:   Stress:   . Feeling of Stress :   Social Connections:   . Frequency of Communication with Friends and Family:   . Frequency of Social Gatherings with Friends and Family:   . Attends Religious Services:   . Active Member of Clubs or Organizations:   . Attends Archivist Meetings:   Marland Kitchen Marital Status:   Intimate Partner Violence:   . Fear of Current or Ex-Partner:   . Emotionally Abused:   Marland Kitchen Physically Abused:   . Sexually Abused:     Review of Systems  Respiratory: Positive for shortness of breath.   Cardiovascular:  Positive for leg swelling.  Musculoskeletal: Positive for back pain.    Vitals:   12/27/19 0856  BP: (!) 142/86  Pulse: 84  Temp: 98.1 F (36.7 C)  SpO2: 94%     Physical Exam  Constitutional: He appears well-developed.  Obese  HENT:  Head: Normocephalic and atraumatic.  Eyes: Pupils are equal, round, and reactive to light. Right eye exhibits no discharge. Left eye exhibits no discharge.  Neck: No tracheal deviation present. No thyromegaly present.  Cardiovascular: Normal rate and regular rhythm.  Pulmonary/Chest:  Effort normal and breath sounds normal. No respiratory distress. He has no wheezes. He exhibits no tenderness.  Musculoskeletal:        General: No edema. Normal range of motion.  Neurological: He is alert.  Psychiatric: He has a normal mood and affect.    Data Reviewed: Echocardiogram showed some dilated chambers from 2019 Chest x-ray from April-no acute infiltrate  Assessment:  COPD with exacerbation  Obstructive sleep apnea  Morbid obesity  Chronic back pain  Plan/Recommendations:  Switch from Advair 500 to Trelegy 200, 1 puff daily 7-day course of prednisone 10 p.o. twice daily  Continue regular use of rescue inhaler as needed  Obtain echocardiogram  Obtain a pulmonary function tests  Graded exercises as tolerated  CPAP therapy on a regular basis-May need to upgrade his machine but feels it is working well at present   Sherrilyn Rist MD [facility] 12/27/2019, 9:30 AM  CC: Burnard Hawthorne, FNP

## 2019-12-30 ENCOUNTER — Other Ambulatory Visit: Payer: Self-pay

## 2019-12-30 DIAGNOSIS — K219 Gastro-esophageal reflux disease without esophagitis: Secondary | ICD-10-CM

## 2019-12-30 MED ORDER — PANTOPRAZOLE SODIUM 40 MG PO TBEC: 40.0000 mg | DELAYED_RELEASE_TABLET | Freq: Every day | ORAL | 3 refills | Status: DC

## 2019-12-31 ENCOUNTER — Other Ambulatory Visit (INDEPENDENT_AMBULATORY_CARE_PROVIDER_SITE_OTHER): Payer: BC Managed Care – PPO

## 2019-12-31 ENCOUNTER — Other Ambulatory Visit: Payer: Self-pay

## 2019-12-31 DIAGNOSIS — M7989 Other specified soft tissue disorders: Secondary | ICD-10-CM

## 2019-12-31 LAB — BASIC METABOLIC PANEL
BUN: 13 mg/dL (ref 6–23)
CO2: 34 mEq/L — ABNORMAL HIGH (ref 19–32)
Calcium: 9.1 mg/dL (ref 8.4–10.5)
Chloride: 95 mEq/L — ABNORMAL LOW (ref 96–112)
Creatinine, Ser: 0.99 mg/dL (ref 0.40–1.50)
GFR: 78.53 mL/min (ref >60.00)
Glucose, Bld: 252 mg/dL — ABNORMAL HIGH (ref 70–99)
Potassium: 3.6 mEq/L (ref 3.5–5.1)
Sodium: 136 mEq/L (ref 135–145)

## 2020-01-01 ENCOUNTER — Telehealth: Payer: Self-pay

## 2020-01-01 ENCOUNTER — Other Ambulatory Visit: Payer: Self-pay

## 2020-01-01 DIAGNOSIS — E876 Hypokalemia: Secondary | ICD-10-CM

## 2020-01-01 MED ORDER — POTASSIUM CHLORIDE CRYS ER 20 MEQ PO TBCR: EXTENDED_RELEASE_TABLET | ORAL | 1 refills | Status: DC

## 2020-01-01 NOTE — Telephone Encounter (Signed)
Left detailed message for patient to call back so I can go over change to potassium dosage.

## 2020-01-01 NOTE — Telephone Encounter (Signed)
Patient called & informed of lab results.Marland Kitchen

## 2020-01-01 NOTE — Telephone Encounter (Signed)
Pt called back returning your call  Please call on home number

## 2020-01-02 ENCOUNTER — Other Ambulatory Visit: Payer: Self-pay

## 2020-01-03 ENCOUNTER — Other Ambulatory Visit: Payer: Self-pay

## 2020-01-03 ENCOUNTER — Ambulatory Visit (INDEPENDENT_AMBULATORY_CARE_PROVIDER_SITE_OTHER): Payer: BC Managed Care – PPO | Admitting: Family

## 2020-01-03 VITALS — BP 134/78 | HR 71 | Temp 98.0°F | Ht 71.0 in | Wt >= 6400 oz

## 2020-01-03 DIAGNOSIS — M7989 Other specified soft tissue disorders: Secondary | ICD-10-CM | POA: Diagnosis not present

## 2020-01-03 DIAGNOSIS — J449 Chronic obstructive pulmonary disease, unspecified: Secondary | ICD-10-CM

## 2020-01-03 DIAGNOSIS — E119 Type 2 diabetes mellitus without complications: Secondary | ICD-10-CM | POA: Diagnosis not present

## 2020-01-03 DIAGNOSIS — I10 Essential (primary) hypertension: Secondary | ICD-10-CM

## 2020-01-03 MED ORDER — LOSARTAN POTASSIUM 25 MG PO TABS: 25.0000 mg | ORAL_TABLET | Freq: Every day | ORAL | 1 refills | Status: DC

## 2020-01-03 NOTE — Patient Instructions (Addendum)
Start losartan  Call to make an appointment for Annual Lung cancer screen  With CT Chest: [phone]. Let me know if any issues in doing so.  Potassium on low end of normal. I would like you to take 1 tablet twice per day of KLOR COn 20 meq on Monday, Tuesday, Thursday, Saturday & Sunday ( 5 days) . Take one tablet daily on Wednesday & Friday. We will continue to monitor

## 2020-01-03 NOTE — Assessment & Plan Note (Signed)
Chronic, unchanged.  Discussed recent potassium. Added ARB which may help hold onto potassium.

## 2020-01-03 NOTE — Assessment & Plan Note (Signed)
Improved since last visit.  Now established with pulmonology.  Will follow

## 2020-01-03 NOTE — Assessment & Plan Note (Signed)
Started ARB today

## 2020-01-03 NOTE — Assessment & Plan Note (Signed)
Slightly elevated today however suspect prednisone contributory.  Reviewed prior blood pressures and felt reasonable to start ARB for additional benefit of renal protection in setting of diabetes.  Patient will closely monitor blood pressure, repeat BMP in 1 week

## 2020-01-03 NOTE — Progress Notes (Signed)
Subjective:    Patient ID: Michael Boyle, male    DOB: 17-Apr-1966, 54 y.o.   MRN: [id number]  CC: Michael Boyle is a 54 y.o. male who presents today for follow up.   HPI: Feels well today No complaints.  HTN- at home, around 125-140/80. On prednisone today. No cp.   SOB has improved on trellegy, prednisone. No cough, increased sputum.     Dr Ander Slade- 12/27/19- DOE-changed to trelegy, pending echo, PFTs Colonoscopy scheduled for 02/28/20 CT lung cancer screen-received phone call and told he would call him back in July , when 54 yo.   a1c- 7.1   HISTORY:  Past Medical History:  Diagnosis Date  . Colon polyps   . COPD (chronic obstructive pulmonary disease) (Michael Boyle)   . Diabetes mellitus with complication (Michael Boyle)   . Genetic testing 12/07/2017   Common Cancers panel (47 genes) @ [facility] - No pathogenic mutations detected  . GERD (gastroesophageal reflux disease)   . Gout   . Incomplete left bundle branch block   . Incomplete left bundle branch block (LBBB)    a. TTE 4/17: EF of 60-65%, unable to assess wall motion, normal LV diastolic function parameters, mildly calcifief mitral annulus  . Morbid obesity (Michael Boyle)   . Palpitations    a. felt to be pSVT  . Palpitations   . Venous insufficiency    Past Surgical History:  Procedure Laterality Date  . COLONOSCOPY WITH PROPOFOL N/A 08/14/2015   Procedure: COLONOSCOPY WITH PROPOFOL;  Surgeon: Lollie Sails, MD;  Location: [facility] ENDOSCOPY;  Service: Endoscopy;  Laterality: N/A;  . COLONOSCOPY WITH PROPOFOL N/A 11/02/2017   Procedure: COLONOSCOPY WITH PROPOFOL;  Surgeon: Lollie Sails, MD;  Location: [facility] ENDOSCOPY;  Service: Endoscopy;  Laterality: N/A;  . ESOPHAGOGASTRODUODENOSCOPY (EGD) WITH PROPOFOL N/A 08/14/2015   Procedure: ESOPHAGOGASTRODUODENOSCOPY (EGD) WITH PROPOFOL;  Surgeon: Lollie Sails, MD;  Location: [facility] ENDOSCOPY;  Service: Endoscopy;  Laterality: N/A;  . WISDOM TOOTH EXTRACTION     Family History    Problem Relation Age of Onset  . Cancer Sister        lymphoma dx 37s; currently 65  . Diabetes Sister   . Cirrhosis Sister   . Colon cancer Paternal Uncle        dx 50s; deceased 30  . Prostate cancer Paternal Uncle        dx 33s; currently 41    Allergies: Patient has no known allergies. Current Outpatient Medications on File Prior to Visit  Medication Sig Dispense Refill  . albuterol (VENTOLIN HFA) 108 (90 Base) MCG/ACT inhaler Inhale 2 puffs into the lungs every 6 (six) hours as needed for wheezing or shortness of breath. 1 Inhaler 2  . allopurinol (ZYLOPRIM) 100 MG tablet Take 1 tablet (100 mg total) by mouth 2(two) times daily. 180 tablet 0  . aspirin EC 81 MG tablet Take 1 tablet (81 mg total) by mouth daily. 90 tablet 3  . Fluticasone-Umeclidin-Vilant (TRELEGY ELLIPTA) 200-62.5-25 MCG/INH AEPB Inhale 1 puff into the lungs daily. 60 each 2  . glucose blood test strip Use as instructed 50 each 12  . metFORMIN (GLUCOPHAGE) 500 MG tablet Take 2 tablets (1,000 mg total) by mouth 2 (two) times daily with a meal. 180 tablet 0  . pantoprazole (PROTONIX) 40 MG tablet Take 1 tablet (40 mg total) by mouth daily. 90 tablet 3  . potassium chloride SA (KLOR-CON) 20 MEQ tablet Take one tablet twice per day on Monday, Tuesday, Thursday, Saturday &  Sunday. Take one tablet per day on Wednesday & Friday. 48 tablet 1  . rosuvastatin (CRESTOR) 10 MG tablet Take 1 tablet (10 mg total) by mouth daily. 90 tablet 3  . torsemide (DEMADEX) 10 MG tablet Take 2 TABLETS (20 mg total) by mouth 2 (two) times daily. 360 tablet 0  . verapamil (CALAN-SR) 180 MG CR tablet Take 1 tablet (180 mg total) by mouth 2 (two) times daily. 180 tablet 0   No current facility-administered medications on file prior to visit.    Social History   Tobacco Use  . Smoking status: Former Smoker    Years: 5.00    Types: Cigarettes    Quit date: 06/13/2012    Years since quitting: 7.5  . Smokeless tobacco: Never Used   Substance Use Topics  . Alcohol use: No    Alcohol/week: 0.0 standard drinks  . Drug use: No    Review of Systems  Constitutional: Negative for chills and fever.  Respiratory: Positive for shortness of breath. Negative for cough and wheezing.   Cardiovascular: Positive for leg swelling (chronic, Bilateral). Negative for chest pain and palpitations.  Gastrointestinal: Negative for nausea and vomiting.      Objective:    BP 134/78   Pulse 71   Temp 98 F (36.7 C) (Skin)   Ht 5\' 11"  (1.803 m)   Wt (!) 406 lb 12.8 oz (184.5 kg)   SpO2 91%   BMI 56.74 kg/m  BP Readings from Last 3 Encounters:  01/03/20 134/78  12/27/19 (!) 142/86  12/02/19 120/64   Wt Readings from Last 3 Encounters:  01/03/20 (!) 406 lb 12.8 oz (184.5 kg)  12/27/19 (!) 411 lb 12.8 oz (186.8 kg)  12/02/19 (!) 410 lb 9.6 oz (186.2 kg)  07/23/2019 161/121 07/10/2019 170/81  Physical Exam Vitals reviewed.  Constitutional:      Appearance: He is well-developed.  Cardiovascular:     Rate and Rhythm: Regular rhythm.     Heart sounds: Normal heart sounds.     Comments: Trace BLE edema. Pulmonary:     Effort: Pulmonary effort is normal. No respiratory distress.     Breath sounds: Normal breath sounds. No wheezing, rhonchi or rales.  Musculoskeletal:     Right lower leg: Edema present.     Left lower leg: Edema present.  Skin:    General: Skin is warm and dry.  Neurological:     Mental Status: He is alert.  Psychiatric:        Speech: Speech normal.        Behavior: Behavior normal.        Assessment & Plan:   Problem List Items Addressed This Visit      Cardiovascular and Mediastinum   Benign essential hypertension - Primary    Slightly elevated today however suspect prednisone contributory.  Reviewed prior blood pressures and felt reasonable to start ARB for additional benefit of renal protection in setting of diabetes.  Patient will closely monitor blood pressure, repeat BMP in 1 week       Relevant Medications   losartan (COZAAR) 25 MG tablet   Other Relevant Orders   Basic metabolic panel     Respiratory   COPD (chronic obstructive pulmonary disease) ([place])    Improved since last visit.  Now established with pulmonology.  Will follow        Endocrine   Diabetes mellitus without complication ([place])    Started ARB today      Relevant Medications  losartan (COZAAR) 25 MG tablet     Other   Leg swelling    Chronic, unchanged.  Discussed recent potassium. Added ARB which may help hold onto potassium.          I have discontinued Shaan L. Kargbo's predniSONE and predniSONE. I am also having him start on losartan. Additionally, I am having him maintain his aspirin EC, albuterol, glucose blood, rosuvastatin, metFORMIN, Trelegy Ellipta, verapamil, allopurinol, torsemide, pantoprazole, and potassium chloride SA.   Meds ordered this encounter  Medications  . losartan (COZAAR) 25 MG tablet    Sig: Take 1 tablet (25 mg total) by mouth daily.    Dispense:  90 tablet    Refill:  1    Order Specific Question:   Supervising Provider    Answer:   Crecencio Mc [[id number]]    Return precautions given.   Risks, benefits, and alternatives of the medications and treatment plan prescribed today were discussed, and patient expressed understanding.   Education regarding symptom management and diagnosis given to patient on AVS.  Continue to follow with Burnard Hawthorne, FNP for routine health maintenance.   Michael Boyle and I agreed with plan.   Mable Paris, FNP

## 2020-01-10 ENCOUNTER — Other Ambulatory Visit: Payer: BC Managed Care – PPO

## 2020-01-15 ENCOUNTER — Telehealth: Payer: Self-pay

## 2020-01-15 NOTE — Telephone Encounter (Signed)
Lm to relay date/time of covid test prior to PFT. 01/17/2020 [facility]  

## 2020-01-15 NOTE — Telephone Encounter (Signed)
Pt is aware of date/time of covid test. Nothing further is needed.  

## 2020-01-17 ENCOUNTER — Other Ambulatory Visit
Admission: RE | Admit: 2020-01-17 | Discharge: 2020-01-17 | Disposition: A | Discharge status: Home / Self Care | Payer: BC Managed Care – PPO | Source: Ambulatory Visit | Attending: Pulmonary Disease | Admitting: Pulmonary Disease

## 2020-01-17 ENCOUNTER — Other Ambulatory Visit: Payer: Self-pay

## 2020-01-17 DIAGNOSIS — Z20822 Contact with and (suspected) exposure to covid-19: Secondary | ICD-10-CM | POA: Diagnosis not present

## 2020-01-17 DIAGNOSIS — Z01812 Encounter for preprocedural laboratory examination: Secondary | ICD-10-CM | POA: Diagnosis not present

## 2020-01-17 LAB — SARS CORONAVIRUS 2 (TAT 6-24 HRS): SARS Coronavirus 2: NEGATIVE

## 2020-01-20 ENCOUNTER — Ambulatory Visit
Admission: RE | Admit: 2020-01-20 | Discharge: 2020-01-20 | Disposition: A | Discharge status: Home / Self Care | Payer: BC Managed Care – PPO | Source: Ambulatory Visit | Attending: Pulmonary Disease | Admitting: Pulmonary Disease

## 2020-01-20 ENCOUNTER — Other Ambulatory Visit: Payer: Self-pay

## 2020-01-20 ENCOUNTER — Ambulatory Visit (HOSPITAL_COMMUNITY): Payer: BC Managed Care – PPO

## 2020-01-20 DIAGNOSIS — R0609 Other forms of dyspnea: Secondary | ICD-10-CM

## 2020-01-20 DIAGNOSIS — R0600 Dyspnea, unspecified: Secondary | ICD-10-CM | POA: Diagnosis present

## 2020-01-20 DIAGNOSIS — J449 Chronic obstructive pulmonary disease, unspecified: Secondary | ICD-10-CM | POA: Diagnosis not present

## 2020-01-20 MED ORDER — ALBUTEROL SULFATE (2.5 MG/3ML) 0.083% IN NEBU
2.5000 mg | INHALATION_SOLUTION | Freq: Once | RESPIRATORY_TRACT | Status: AC
  Administered 2020-01-20: 2.5 mg via RESPIRATORY_TRACT
  Filled 2020-01-20: qty 3

## 2020-01-20 MED ORDER — PERFLUTREN LIPID MICROSPHERE
1.0000 mL | INTRAVENOUS | Status: AC | PRN
  Administered 2020-01-20: 2 mL via INTRAVENOUS
  Filled 2020-01-20: qty 10

## 2020-01-20 NOTE — Progress Notes (Signed)
*  PRELIMINARY RESULTS* Echocardiogram 2D Echocardiogram has been performed.  Michael Boyle Michael Boyle 01/20/2020, 11:27 AM

## 2020-02-11 ENCOUNTER — Other Ambulatory Visit: Payer: Self-pay

## 2020-02-11 MED ORDER — CLENPIQ 10-3.5-12 MG-GM -GM/160ML PO SOLN: 320.0000 mL | Freq: Once | ORAL | 0 refills | Status: AC

## 2020-02-11 NOTE — Progress Notes (Signed)
[facility] called and Arby Barrette states that suprep is not cover for patient. Sent a replacement to phgarmacy

## 2020-02-21 ENCOUNTER — Ambulatory Visit
Admission: RE | Admit: 2020-02-21 | Discharge: 2020-02-21 | Disposition: A | Discharge status: Home / Self Care | Payer: BC Managed Care – PPO | Source: Ambulatory Visit | Attending: Family | Admitting: Family

## 2020-02-21 ENCOUNTER — Ambulatory Visit: Payer: BC Managed Care – PPO | Admitting: Family

## 2020-02-21 ENCOUNTER — Other Ambulatory Visit: Payer: Self-pay

## 2020-02-21 ENCOUNTER — Encounter: Payer: Self-pay | Admitting: Family

## 2020-02-21 ENCOUNTER — Telehealth: Payer: Self-pay | Admitting: Family

## 2020-02-21 VITALS — BP 132/70 | HR 70 | Temp 98.7°F | Resp 12 | Ht 70.98 in | Wt >= 6400 oz

## 2020-02-21 DIAGNOSIS — M7989 Other specified soft tissue disorders: Secondary | ICD-10-CM | POA: Diagnosis present

## 2020-02-21 LAB — BASIC METABOLIC PANEL
BUN: 9 mg/dL (ref 6–23)
CO2: 31 mEq/L (ref 19–32)
Calcium: 9.1 mg/dL (ref 8.4–10.5)
Chloride: 98 mEq/L (ref 96–112)
Creatinine, Ser: 0.98 mg/dL (ref 0.40–1.50)
GFR: 79.41 mL/min (ref >60.00)
Glucose, Bld: 135 mg/dL — ABNORMAL HIGH (ref 70–99)
Potassium: 3.3 mEq/L — ABNORMAL LOW (ref 3.5–5.1)
Sodium: 138 mEq/L (ref 135–145)

## 2020-02-21 MED ORDER — DOXYCYCLINE HYCLATE 100 MG PO TBEC: 100.0000 mg | DELAYED_RELEASE_TABLET | Freq: Two times a day (BID) | ORAL | 0 refills | Status: DC

## 2020-02-21 NOTE — Patient Instructions (Addendum)
Continue losartan 25mg  It is imperative that you are seen AT least twice per year for labs and monitoring. Monitor blood pressure at home and me 5-6 reading on separate days. Goal is less than 120/80, based on newest guidelines, however we certainly want to be less than 130/80;  if persistently higher, please make sooner follow up appointment so we can recheck you blood pressure and manage/ adjust medications.  Stat ultrasound today- keep phone with you so I can call you with results  Start doxycycline ( antibiotic)  Let me know if redness does not improve over the next 12-24 hrs.  Certainly if redness, pain swelling or worsening of the weekend I would like you to be reevaluated in urgent care.  Please avoid the sun while you're on doxycycline as it can increase your risk of sunburn.  Ensure to take probiotics while on antibiotics and also for 2 weeks after completion. It is important to re-colonize the gut with good bacteria and also to prevent any diarrheal infections associated with antibiotic use.

## 2020-02-21 NOTE — Telephone Encounter (Signed)
Call pt I meant to boost him for tdap today Please sch nurse visit for this

## 2020-02-21 NOTE — Telephone Encounter (Signed)
Michael Boyle states that he will call back to schedule for a nurse visit for his TDAP. He is working out of town and does not know the next time he will be available to come into the office.

## 2020-02-21 NOTE — Progress Notes (Signed)
Subjective:    Patient ID: Michael Boyle, male    DOB: 1965/12/21, 54 y.o.   MRN: [id number]  CC: Michael Boyle is a 54 y.o. male who presents today for an acute visit.    HPI: CC: left leg wound x 1 week, waxed and waned. Was carrrying a crowbar and the crowbar was swinging and hit his leg with it.   yesterday redness had worsened which prompted call for an appointment. Endorses persistent Tenderness, unchanged.  Bruising has resolved.  No break in skins.   No changes in activity, recent surgery, immobilization.  No cp, increased sob, n, v, fever. SOB with steps is at baseline.  Recently started losartan.   tdap due   No h/o dvt, cellulitis, mrsa.  Had PFTs, echocardiology with Dr Philip Aspen Dr [place] Anon 04/30/20  Echo ef 55-60%    HISTORY:  Past Medical History:  Diagnosis Date  . Colon polyps   . COPD (chronic obstructive pulmonary disease) ([place])   . Diabetes mellitus with complication ([place])   . Genetic testing 12/07/2017   Common Cancers panel (47 genes) @ [facility] - No pathogenic mutations detected  . GERD (gastroesophageal reflux disease)   . Gout   . Incomplete left bundle branch block   . Incomplete left bundle branch block (LBBB)    a. TTE 4/17: EF of 60-65%, unable to assess wall motion, normal LV diastolic function parameters, mildly calcifief mitral annulus  . Morbid obesity ([place])   . Palpitations    a. felt to be pSVT  . Palpitations   . Venous insufficiency    Past Surgical History:  Procedure Laterality Date  . COLONOSCOPY WITH PROPOFOL N/A 08/14/2015   Procedure: COLONOSCOPY WITH PROPOFOL;  Surgeon: Lollie Sails, MD;  Location: [facility] ENDOSCOPY;  Service: Endoscopy;  Laterality: N/A;  . COLONOSCOPY WITH PROPOFOL N/A 11/02/2017   Procedure: COLONOSCOPY WITH PROPOFOL;  Surgeon: Lollie Sails, MD;  Location: [facility] ENDOSCOPY;  Service: Endoscopy;  Laterality: N/A;  . ESOPHAGOGASTRODUODENOSCOPY (EGD) WITH PROPOFOL N/A 08/14/2015   Procedure:  ESOPHAGOGASTRODUODENOSCOPY (EGD) WITH PROPOFOL;  Surgeon: Lollie Sails, MD;  Location: [facility] ENDOSCOPY;  Service: Endoscopy;  Laterality: N/A;  . WISDOM TOOTH EXTRACTION     Family History  Problem Relation Age of Onset  . Cancer Sister        lymphoma dx 60s; currently 62  . Diabetes Sister   . Cirrhosis Sister   . Colon cancer Paternal Uncle        dx 7s; deceased 72  . Prostate cancer Paternal Uncle        dx 80s; currently 75    Allergies: Patient has no known allergies. Current Outpatient Medications on File Prior to Visit  Medication Sig Dispense Refill  . albuterol (VENTOLIN HFA) 108 (90 Base) MCG/ACT inhaler Inhale 2 puffs into the lungs every 6 (six) hours as needed for wheezing or shortness of breath. 1 Inhaler 2  . allopurinol (ZYLOPRIM) 100 MG tablet Take 1 tablet (100 mg total) by mouth 2(two) times daily. 180 tablet 0  . aspirin EC 81 MG tablet Take 1 tablet (81 mg total) by mouth daily. 90 tablet 3  . Fluticasone-Umeclidin-Vilant (TRELEGY ELLIPTA) 200-62.5-25 MCG/INH AEPB Inhale 1 puff into the lungs daily. 60 each 2  . glucose blood test strip Use as instructed 50 each 12  . losartan (COZAAR) 25 MG tablet Take 1 tablet (25 mg total) by mouth daily. 90 tablet 1  . metFORMIN (GLUCOPHAGE) 500 MG tablet Take  2 tablets (1,000 mg total) by mouth 2 (two) times daily with a meal. 180 tablet 0  . pantoprazole (PROTONIX) 40 MG tablet Take 1 tablet (40 mg total) by mouth daily. 90 tablet 3  . potassium chloride SA (KLOR-CON) 20 MEQ tablet Take one tablet twice per day on Monday, Tuesday, Thursday, Saturday & Sunday. Take one tablet per day on Wednesday & Friday. 48 tablet 1  . rosuvastatin (CRESTOR) 10 MG tablet Take 1 tablet (10 mg total) by mouth daily. 90 tablet 3  . torsemide (DEMADEX) 10 MG tablet Take 2 TABLETS (20 mg total) by mouth 2 (two) times daily. 360 tablet 0  . verapamil (CALAN-SR) 180 MG CR tablet Take 1 tablet (180 mg total) by mouth 2 (two) times daily. 180  tablet 0   No current facility-administered medications on file prior to visit.    Social History   Tobacco Use  . Smoking status: Former Smoker    Years: 5.00    Types: Cigarettes    Quit date: 06/13/2012    Years since quitting: 7.6  . Smokeless tobacco: Never Used  Vaping Use  . Vaping Use: Former  Substance Use Topics  . Alcohol use: No    Alcohol/week: 0.0 standard drinks  . Drug use: No    Review of Systems  Constitutional: Negative for chills and fever.  Respiratory: Negative for cough and shortness of breath.   Cardiovascular: Positive for leg swelling. Negative for chest pain and palpitations.  Gastrointestinal: Negative for nausea and vomiting.      Objective:    BP 132/70   Pulse 70   Temp 98.7 F (37.1 C)   Resp 12   Ht 5' 10.98" (1.803 m)   Wt (!) 410 lb 9.6 oz (186.2 kg)   SpO2 95%   BMI 57.29 kg/m   BP Readings from Last 3 Encounters:  02/21/20 132/70  01/03/20 134/78  12/27/19 (!) 142/86    Physical Exam Vitals reviewed.  Constitutional:      Appearance: He is well-developed.  Cardiovascular:     Rate and Rhythm: Regular rhythm.     Heart sounds: Normal heart sounds.     Comments: BLE +1 non pitting edema, notably worse left calf. NO palpable cords or masses. Localized area left mid shin erythema . Skin intact. No increased warmth.  LE hair growth symmetric and present. varicosities noted. LE warm and palpable pedal pulses.  Pulmonary:     Effort: Pulmonary effort is normal. No respiratory distress.     Breath sounds: Normal breath sounds. No wheezing, rhonchi or rales.  Musculoskeletal:     Right lower leg: 1+ Edema present.     Left lower leg: 2+ Edema present.  Skin:    General: Skin is warm and dry.          Comments: Localized erythema as noted on diagram  Neurological:     Mental Status: He is alert.  Psychiatric:        Speech: Speech normal.        Behavior: Behavior normal.        Assessment & Plan:   Problem  List Items Addressed This Visit      Other   Left leg swelling - Primary    Well appearing, no increased sob from baseline. He is not tachycardic. Chronic bilateral leg swelling which is overall controlled on torsemide regimen. However concerned by LLE erythema, notable increase of swelling. Stat US bilaterally and will start doxycycline with close  follow up. He knows to seek immediate medical treatment over the weekend if symptoms were to worsen in any way or fail to rapidly improve.       Relevant Medications   doxycycline (DORYX) 100 MG EC tablet   Other Relevant Orders   Basic metabolic panel   US Venous Img Lower Bilateral         I am having Michael Boyle start on doxycycline. I am also having him maintain his aspirin EC, albuterol, glucose blood, rosuvastatin, metFORMIN, Trelegy Ellipta, verapamil, allopurinol, torsemide, pantoprazole, potassium chloride SA, and losartan.   Meds ordered this encounter  Medications  . doxycycline (DORYX) 100 MG EC tablet    Sig: Take 1 tablet (100 mg total) by mouth 2 (two) times daily.    Dispense:  20 tablet    Refill:  0    Order Specific Question:   Supervising Provider    Answer:   Crecencio Mc [[id number]]    Return precautions given.   Risks, benefits, and alternatives of the medications and treatment plan prescribed today were discussed, and patient expressed understanding.   Education regarding symptom management and diagnosis given to patient on AVS.  Continue to follow with Burnard Hawthorne, FNP for routine health maintenance.   Louann Liv and I agreed with plan.   Mable Paris, FNP

## 2020-02-21 NOTE — Assessment & Plan Note (Addendum)
Well appearing, no increased sob from baseline. He is not tachycardic. Chronic bilateral leg swelling which is overall controlled on torsemide regimen. However concerned by LLE erythema, notable increase of swelling. Stat US bilaterally and will start doxycycline with close follow up. He knows to seek immediate medical treatment over the weekend if symptoms were to worsen in any way or fail to rapidly improve.

## 2020-02-26 ENCOUNTER — Other Ambulatory Visit
Admission: RE | Admit: 2020-02-26 | Discharge: 2020-02-26 | Disposition: A | Discharge status: Home / Self Care | Payer: BC Managed Care – PPO | Source: Ambulatory Visit | Attending: Gastroenterology | Admitting: Gastroenterology

## 2020-02-26 ENCOUNTER — Other Ambulatory Visit: Payer: Self-pay

## 2020-02-26 DIAGNOSIS — Z01812 Encounter for preprocedural laboratory examination: Secondary | ICD-10-CM | POA: Diagnosis not present

## 2020-02-26 DIAGNOSIS — Z20822 Contact with and (suspected) exposure to covid-19: Secondary | ICD-10-CM | POA: Insufficient documentation

## 2020-02-26 LAB — SARS CORONAVIRUS 2 (TAT 6-24 HRS): SARS Coronavirus 2: NEGATIVE

## 2020-02-27 ENCOUNTER — Encounter: Payer: Self-pay | Admitting: Gastroenterology

## 2020-02-28 ENCOUNTER — Encounter: Payer: Self-pay | Admitting: Gastroenterology

## 2020-02-28 ENCOUNTER — Ambulatory Visit: Payer: BC Managed Care – PPO | Admitting: Anesthesiology

## 2020-02-28 ENCOUNTER — Encounter
Admission: RE | Disposition: A | Discharge status: Home / Self Care | Payer: Self-pay | Source: Home / Self Care | Attending: Gastroenterology

## 2020-02-28 ENCOUNTER — Ambulatory Visit
Admission: RE | Admit: 2020-02-28 | Discharge: 2020-02-28 | Disposition: A | Discharge status: Home / Self Care | Payer: BC Managed Care – PPO | Attending: Gastroenterology | Admitting: Gastroenterology

## 2020-02-28 ENCOUNTER — Other Ambulatory Visit: Payer: Self-pay

## 2020-02-28 ENCOUNTER — Telehealth: Payer: Self-pay | Admitting: *Deleted

## 2020-02-28 DIAGNOSIS — Z7982 Long term (current) use of aspirin: Secondary | ICD-10-CM | POA: Diagnosis not present

## 2020-02-28 DIAGNOSIS — J449 Chronic obstructive pulmonary disease, unspecified: Secondary | ICD-10-CM | POA: Insufficient documentation

## 2020-02-28 DIAGNOSIS — K219 Gastro-esophageal reflux disease without esophagitis: Secondary | ICD-10-CM | POA: Diagnosis not present

## 2020-02-28 DIAGNOSIS — E118 Type 2 diabetes mellitus with unspecified complications: Secondary | ICD-10-CM | POA: Insufficient documentation

## 2020-02-28 DIAGNOSIS — Z7984 Long term (current) use of oral hypoglycemic drugs: Secondary | ICD-10-CM | POA: Insufficient documentation

## 2020-02-28 DIAGNOSIS — Z7951 Long term (current) use of inhaled steroids: Secondary | ICD-10-CM | POA: Insufficient documentation

## 2020-02-28 DIAGNOSIS — Z6841 Body Mass Index (BMI) 40.0 and over, adult: Secondary | ICD-10-CM | POA: Insufficient documentation

## 2020-02-28 DIAGNOSIS — Z87891 Personal history of nicotine dependence: Secondary | ICD-10-CM

## 2020-02-28 DIAGNOSIS — Z86010 Personal history of colonic polyps: Secondary | ICD-10-CM | POA: Diagnosis not present

## 2020-02-28 DIAGNOSIS — G4730 Sleep apnea, unspecified: Secondary | ICD-10-CM | POA: Insufficient documentation

## 2020-02-28 DIAGNOSIS — M109 Gout, unspecified: Secondary | ICD-10-CM | POA: Insufficient documentation

## 2020-02-28 DIAGNOSIS — D126 Benign neoplasm of colon, unspecified: Secondary | ICD-10-CM

## 2020-02-28 DIAGNOSIS — Z09 Encounter for follow-up examination after completed treatment for conditions other than malignant neoplasm: Secondary | ICD-10-CM | POA: Insufficient documentation

## 2020-02-28 DIAGNOSIS — Z79899 Other long term (current) drug therapy: Secondary | ICD-10-CM | POA: Insufficient documentation

## 2020-02-28 DIAGNOSIS — Z122 Encounter for screening for malignant neoplasm of respiratory organs: Secondary | ICD-10-CM

## 2020-02-28 HISTORY — PX: COLONOSCOPY WITH PROPOFOL: SHX5780

## 2020-02-28 LAB — GLUCOSE, CAPILLARY: Glucose-Capillary: 119 mg/dL — ABNORMAL HIGH (ref 70–99)

## 2020-02-28 SURGERY — COLONOSCOPY WITH PROPOFOL
Anesthesia: General

## 2020-02-28 MED ORDER — PROPOFOL 500 MG/50ML IV EMUL
INTRAVENOUS | Status: DC | PRN
  Administered 2020-02-28: 125 ug/kg/min via INTRAVENOUS

## 2020-02-28 MED ORDER — PROPOFOL 10 MG/ML IV BOLUS
INTRAVENOUS | Status: DC | PRN
  Administered 2020-02-28 (×3): 75 mg via INTRAVENOUS

## 2020-02-28 MED ORDER — LIDOCAINE HCL (PF) 2 % IJ SOLN
INTRAMUSCULAR | Status: AC
  Filled 2020-02-28: qty 5

## 2020-02-28 MED ORDER — SODIUM CHLORIDE 0.9 % IV SOLN: INTRAVENOUS | Status: DC

## 2020-02-28 MED ORDER — PROPOFOL 500 MG/50ML IV EMUL
INTRAVENOUS | Status: AC
  Filled 2020-02-28: qty 50

## 2020-02-28 NOTE — Anesthesia Postprocedure Evaluation (Signed)
Anesthesia Post Note  Patient: Michael Boyle  Procedure(s) Performed: COLONOSCOPY WITH PROPOFOL (N/A )  Patient location during evaluation: Endoscopy Anesthesia Type: General Level of consciousness: awake and alert Pain management: pain level controlled Vital Signs Assessment: post-procedure vital signs reviewed and stable Respiratory status: spontaneous breathing, nonlabored ventilation, respiratory function stable and patient connected to nasal cannula oxygen Cardiovascular status: blood pressure returned to baseline and stable Postop Assessment: no apparent nausea or vomiting Anesthetic complications: no   No complications documented.   Last Vitals:  Vitals:   02/28/20 0856 02/28/20 0906  BP: 116/67 124/74  Pulse: 64 48  Resp: 17 18  Temp:    SpO2: 96% 94%    Last Pain:  Vitals:   02/28/20 0906  TempSrc:   PainSc: 0-No pain                 Martha Clan

## 2020-02-28 NOTE — Transfer of Care (Signed)
Immediate Anesthesia Transfer of Care Note  Patient: Michael Boyle  Procedure(s) Performed: COLONOSCOPY WITH PROPOFOL (N/A )  Patient Location: PACU and Endoscopy Unit  Anesthesia Type:General  Level of Consciousness: awake  Airway & Oxygen Therapy: Patient Spontanous Breathing  Post-op Assessment: Report given to RN  Post vital signs: stable  Last Vitals:  Vitals Value Taken Time  BP    Temp    Pulse 64 02/28/20 0846  Resp 22 02/28/20 0846  SpO2 94 % 02/28/20 0846  Vitals shown include unvalidated device data.  Last Pain:  Vitals:   02/28/20 0717  TempSrc: Temporal  PainSc: 0-No pain         Complications: No complications documented.

## 2020-02-28 NOTE — Telephone Encounter (Signed)
Received referral for initial lung cancer screening scan. Contacted patient and obtained smoking history,(former, quit 06/13/12, 52.5 pack year) as well as answering questions related to screening process. Patient denies signs of lung cancer such as weight loss or hemoptysis. Patient denies comorbidity that would prevent curative treatment if lung cancer were found. Patient is scheduled for shared decision making visit and CT scan on 04/21/20 at 115pm.

## 2020-02-28 NOTE — Anesthesia Preprocedure Evaluation (Signed)
Anesthesia Evaluation  Patient identified by MRN, date of birth, ID band Patient awake    Reviewed: Allergy & Precautions, H&P , NPO status , Patient's Chart, lab work & pertinent test results, reviewed documented beta blocker date and time   History of Anesthesia Complications Negative for: history of anesthetic complications  Airway Mallampati: II  TM Distance: >3 FB Neck ROM: full    Dental no notable dental hx. (+) Partial Upper, Missing, Dental Advidsory Given, Caps   Pulmonary neg shortness of breath, sleep apnea and Continuous Positive Airway Pressure Ventilation , COPD, neg recent URI, former smoker,    Pulmonary exam normal breath sounds clear to auscultation       Cardiovascular Exercise Tolerance: Good (-) hypertension(-) angina(-) CAD, (-) Past MI, (-) Cardiac Stents and (-) CABG Normal cardiovascular exam+ dysrhythmias Atrial Fibrillation (-) Valvular Problems/Murmurs Rhythm:regular Rate:Normal     Neuro/Psych negative neurological ROS  negative psych ROS   GI/Hepatic Neg liver ROS, GERD  Medicated and Controlled,  Endo/Other  diabetesMorbid obesity  Renal/GU negative Renal ROS  negative genitourinary   Musculoskeletal   Abdominal   Peds  Hematology negative hematology ROS (+)   Anesthesia Other Findings Past Medical History:   GERD (gastroesophageal reflux disease)                       COPD (chronic obstructive pulmonary disease) (*              Atrial fibrillation (H[facility])                                    Reproductive/Obstetrics negative OB ROS                             Anesthesia Physical  Anesthesia Plan  ASA: III  Anesthesia Plan: General   Post-op Pain Management:    Induction: Intravenous  PONV Risk Score and Plan: 1 and Propofol infusion and TIVA  Airway Management Planned: Natural Airway and Nasal Cannula  Additional Equipment:   Intra-op Plan:    Post-operative Plan:   Informed Consent: I have reviewed the patients History and Physical, chart, labs and discussed the procedure including the risks, benefits and alternatives for the proposed anesthesia with the patient or authorized representative who has indicated his/her understanding and acceptance.     Dental Advisory Given  Plan Discussed with: Anesthesiologist, CRNA and Surgeon  Anesthesia Plan Comments:         Anesthesia Quick Evaluation

## 2020-02-28 NOTE — H&P (Signed)
Jonathon Bellows, MD [address] [employer], [place], [place], [address] [address], [place], [place], [place], [address] Phone: [phone]  Fax: [phone]  Primary Care Physician:  Burnard Hawthorne, FNP   Pre-Procedure History & Physical: HPI:  Michael Boyle is a 54 y.o. male is here for an colonoscopy.   Past Medical History:  Diagnosis Date  . Colon polyps   . COPD (chronic obstructive pulmonary disease) ([place])   . Diabetes mellitus with complication ([place])   . Genetic testing 12/07/2017   Common Cancers panel (47 genes) @ [facility] - No pathogenic mutations detected  . GERD (gastroesophageal reflux disease)   . Gout   . Incomplete left bundle branch block   . Incomplete left bundle branch block (LBBB)    a. TTE 4/17: EF of 60-65%, unable to assess wall motion, normal LV diastolic function parameters, mildly calcifief mitral annulus  . Morbid obesity ([place])   . Palpitations    a. felt to be pSVT  . Palpitations   . Venous insufficiency     Past Surgical History:  Procedure Laterality Date  . COLONOSCOPY WITH PROPOFOL N/A 08/14/2015   Procedure: COLONOSCOPY WITH PROPOFOL;  Surgeon: Lollie Sails, MD;  Location: [facility] ENDOSCOPY;  Service: Endoscopy;  Laterality: N/A;  . COLONOSCOPY WITH PROPOFOL N/A 11/02/2017   Procedure: COLONOSCOPY WITH PROPOFOL;  Surgeon: Lollie Sails, MD;  Location: [facility] ENDOSCOPY;  Service: Endoscopy;  Laterality: N/A;  . ESOPHAGOGASTRODUODENOSCOPY (EGD) WITH PROPOFOL N/A 08/14/2015   Procedure: ESOPHAGOGASTRODUODENOSCOPY (EGD) WITH PROPOFOL;  Surgeon: Lollie Sails, MD;  Location: [facility] ENDOSCOPY;  Service: Endoscopy;  Laterality: N/A;  . WISDOM TOOTH EXTRACTION      Prior to Admission medications   Medication Sig Start Date End Date Taking? Authorizing Provider  albuterol (VENTOLIN HFA) 108 (90 Base) MCG/ACT inhaler Inhale 2 puffs into the lungs every 6 (six) hours as needed for wheezing or shortness of breath. 12/03/18  Yes Guse, Jacquelynn Cree, FNP  allopurinol (ZYLOPRIM) 100 MG tablet Take 1 tablet (100 mg total) by mouth 2(two) times daily. 12/27/19  Yes Leone Haven, MD  aspirin EC 81 MG tablet Take 1 tablet (81 mg total) by mouth daily. 10/05/17  Yes Dunn, Areta Haber, PA-C  doxycycline (DORYX) 100 MG EC tablet Take 1 tablet (100 mg total) by mouth 2 (two) times daily. 02/21/20  Yes Burnard Hawthorne, FNP  Fluticasone-Umeclidin-Vilant (TRELEGY ELLIPTA) 200-62.5-25 MCG/INH AEPB Inhale 1 puff into the lungs daily. 12/27/19  Yes Olalere, Adewale A, MD  glucose blood test strip Use as instructed 05/09/19  Yes Guse, Jacquelynn Cree, FNP  losartan (COZAAR) 25 MG tablet Take 1 tablet (25 mg total) by mouth daily. 01/03/20  Yes Burnard Hawthorne, FNP  metFORMIN (GLUCOPHAGE) 500 MG tablet Take 2 tablets (1,000 mg total) by mouth 2 (two) times daily with a meal. 11/15/19  Yes Arnett, Yvetta Coder, FNP  pantoprazole (PROTONIX) 40 MG tablet Take 1 tablet (40 mg total) by mouth daily. 12/30/19  Yes Burnard Hawthorne, FNP  potassium chloride SA (KLOR-CON) 20 MEQ tablet Take one tablet twice per day on Monday, Tuesday, Thursday, Saturday & Sunday. Take one tablet per day on Wednesday & Friday. 01/01/20  Yes Arnett, Yvetta Coder, FNP  rosuvastatin (CRESTOR) 10 MG tablet Take 1 tablet (10 mg total) by mouth daily. 10/04/19  Yes Burnard Hawthorne, FNP  torsemide (DEMADEX) 10 MG tablet Take 2 TABLETS (20 mg total) by mouth 2 (two) times daily. 12/27/19  Yes Sonnenberg,  Angela Adam, MD  verapamil (CALAN-SR) 180 MG CR tablet Take 1 tablet (180 mg total) by mouth 2 (two) times daily. 12/27/19  Yes Leone Haven, MD    Allergies as of 12/12/2019  . (No Known Allergies)    Family History  Problem Relation Age of Onset  . Cancer Sister        lymphoma dx 101s; currently 73  . Diabetes Sister   . Cirrhosis Sister   . Colon cancer Paternal Uncle        dx 35s; deceased 63  . Prostate cancer Paternal Uncle        dx 79s; currently 73    Social History    Socioeconomic History  . Marital status: Married    Spouse name: Not on file  . Number of children: Not on file  . Years of education: Not on file  . Highest education level: Not on file  Occupational History  . Not on file  Tobacco Use  . Smoking status: Former Smoker    Years: 5.00    Types: Cigarettes    Quit date: 06/13/2012    Years since quitting: 7.7  . Smokeless tobacco: Never Used  Vaping Use  . Vaping Use: Former  Substance and Sexual Activity  . Alcohol use: No    Alcohol/week: 0.0 standard drinks  . Drug use: No  . Sexual activity: Not on file  Other Topics Concern  . Not on file  Social History Narrative  . Not on file   Social Determinants of Health   Financial Resource Strain:   . Difficulty of Paying Living Expenses:   Food Insecurity:   . Worried About Charity fundraiser in the Last Year:   . Arboriculturist in the Last Year:   Transportation Needs:   . Film/video editor (Medical):   Marland Kitchen Lack of Transportation (Non-Medical):   Physical Activity:   . Days of Exercise per Week:   . Minutes of Exercise per Session:   Stress:   . Feeling of Stress :   Social Connections:   . Frequency of Communication with Friends and Family:   . Frequency of Social Gatherings with Friends and Family:   . Attends Religious Services:   . Active Member of Clubs or Organizations:   . Attends Archivist Meetings:   Marland Kitchen Marital Status:   Intimate Partner Violence:   . Fear of Current or Ex-Partner:   . Emotionally Abused:   Marland Kitchen Physically Abused:   . Sexually Abused:     Review of Systems: See HPI, otherwise negative ROS  Physical Exam: BP (!) 120/59   Pulse 65   Temp 97.7 F (36.5 C) (Temporal)   Resp 18   Ht 5\' 11"  (1.803 m)   Wt (!) 181.4 kg   SpO2 96%   BMI 55.79 kg/m  General:   Alert,  pleasant and cooperative in NAD Head:  Normocephalic and atraumatic. Neck:  Supple; no masses or thyromegaly. Lungs:  Clear throughout to auscultation,  normal respiratory effort.    Heart:  +S1, +S2, Regular rate and rhythm, No edema. Abdomen:  Soft, nontender and nondistended. Normal bowel sounds, without guarding, and without rebound.   Neurologic:  Alert and  oriented x4;  grossly normal neurologically.  Impression/Plan: Louann Liv is here for an colonoscopy to be performed for surveillance due to prior history of colon polyps   Risks, benefits, limitations, and alternatives regarding  colonoscopy have been reviewed  with the patient.  Questions have been answered.  All parties agreeable.   Jonathon Bellows, MD  02/28/2020, 8:06 AM

## 2020-02-28 NOTE — Op Note (Signed)
[facility] Gastroenterology Patient Name: Michael Boyle Procedure Date: 02/28/2020 8:02 AM MRN: [id number] Account #: [id number] Date of Birth: 03-19-1966 Admit Type: Outpatient Age: 54 Room: [facility] ENDO ROOM 2 Gender: Male Note Status: Finalized Procedure:             Colonoscopy Indications:           High risk colon cancer surveillance: Personal history                         of colonic polyps, Last colonoscopy: March 2019 Providers:             Jonathon Bellows MD, MD Referring MD:          Yvetta Coder. Arnett (Referring MD) Medicines:             Monitored Anesthesia Care Complications:         No immediate complications. Procedure:             Pre-Anesthesia Assessment:                        - Prior to the procedure, a History and Physical was                         performed, and patient medications, allergies and                         sensitivities were reviewed. The patient's tolerance                         of previous anesthesia was reviewed.                        - The risks and benefits of the procedure and the                         sedation options and risks were discussed with the                         patient. All questions were answered and informed                         consent was obtained.                        - ASA Grade Assessment: III - A patient with severe                         systemic disease.                        After obtaining informed consent, the colonoscope was                         passed under direct vision. Throughout the procedure,                         the patient's blood pressure, pulse, and oxygen                         saturations were monitored  continuously. The                         Colonoscope was introduced through the anus and                         advanced to the the cecum, identified by the                         appendiceal orifice. The colonoscopy was performed                         with  ease. The patient tolerated the procedure well.                         The quality of the bowel preparation was poor. Findings:      The perianal and digital rectal examinations were normal.      A large amount of semi-liquid stool was found in the entire colon. Impression:            - Preparation of the colon was poor.                        - Stool in the entire examined colon.                        - No specimens collected. Recommendation:        - Discharge patient to home (with escort).                        - Resume previous diet.                        - Continue present medications.                        - Repeat colonoscopy in 4 months because the bowel                         preparation was suboptimal. Procedure Code(s):     --- Professional ---                        [phone], Colonoscopy, flexible; diagnostic, including                         collection of specimen(s) by brushing or washing, when                         performed (separate procedure) Diagnosis Code(s):     --- Professional ---                        Z86.010, Personal history of colonic polyps CPT copyright 2019 American Medical Association. All rights reserved. The codes documented in this report are preliminary and upon coder review may  be revised to meet current compliance requirements. Jonathon Bellows, MD Jonathon Bellows MD, MD 02/28/2020 8:41:14 AM This report has been signed electronically. Number of Addenda: 0 Note Initiated On: 02/28/2020 8:02 AM Scope Withdrawal Time: 0 hours 4 minutes 49 seconds  Total Procedure Duration: 0 hours  11 minutes 39 seconds  Estimated Blood Loss:  Estimated blood loss: none.      [facility]

## 2020-03-02 ENCOUNTER — Encounter: Payer: Self-pay | Admitting: Gastroenterology

## 2020-03-26 ENCOUNTER — Other Ambulatory Visit: Payer: Self-pay | Admitting: Family Medicine

## 2020-03-26 DIAGNOSIS — M7989 Other specified soft tissue disorders: Secondary | ICD-10-CM

## 2020-03-26 DIAGNOSIS — I479 Paroxysmal tachycardia, unspecified: Secondary | ICD-10-CM

## 2020-03-26 DIAGNOSIS — M1A2720 Drug-induced chronic gout, left ankle and foot, without tophus (tophi): Secondary | ICD-10-CM

## 2020-04-06 ENCOUNTER — Encounter: Payer: Self-pay | Admitting: Family

## 2020-04-06 ENCOUNTER — Ambulatory Visit (INDEPENDENT_AMBULATORY_CARE_PROVIDER_SITE_OTHER): Payer: BC Managed Care – PPO | Admitting: Family

## 2020-04-06 ENCOUNTER — Other Ambulatory Visit: Payer: Self-pay

## 2020-04-06 VITALS — BP 110/70 | HR 60 | Temp 98.3°F | Ht 71.0 in | Wt >= 6400 oz

## 2020-04-06 DIAGNOSIS — Z1159 Encounter for screening for other viral diseases: Secondary | ICD-10-CM

## 2020-04-06 DIAGNOSIS — E785 Hyperlipidemia, unspecified: Secondary | ICD-10-CM

## 2020-04-06 DIAGNOSIS — E119 Type 2 diabetes mellitus without complications: Secondary | ICD-10-CM | POA: Diagnosis not present

## 2020-04-06 DIAGNOSIS — L600 Ingrowing nail: Secondary | ICD-10-CM | POA: Diagnosis not present

## 2020-04-06 DIAGNOSIS — I10 Essential (primary) hypertension: Secondary | ICD-10-CM | POA: Diagnosis not present

## 2020-04-06 DIAGNOSIS — Z114 Encounter for screening for human immunodeficiency virus [HIV]: Secondary | ICD-10-CM

## 2020-04-06 DIAGNOSIS — Z23 Encounter for immunization: Secondary | ICD-10-CM | POA: Diagnosis not present

## 2020-04-06 DIAGNOSIS — J449 Chronic obstructive pulmonary disease, unspecified: Secondary | ICD-10-CM

## 2020-04-06 DIAGNOSIS — Z125 Encounter for screening for malignant neoplasm of prostate: Secondary | ICD-10-CM | POA: Diagnosis not present

## 2020-04-06 LAB — COMPREHENSIVE METABOLIC PANEL
ALT: 18 U/L (ref 0–53)
AST: 16 U/L (ref 0–37)
Albumin: 4.3 g/dL (ref 3.5–5.2)
Alkaline Phosphatase: 71 U/L (ref 39–117)
BUN: 12 mg/dL (ref 6–23)
CO2: 28 mEq/L (ref 19–32)
Calcium: 9.5 mg/dL (ref 8.4–10.5)
Chloride: 99 mEq/L (ref 96–112)
Creatinine, Ser: 0.89 mg/dL (ref 0.40–1.50)
GFR: 88.71 mL/min (ref >60.00)
Glucose, Bld: 123 mg/dL — ABNORMAL HIGH (ref 70–99)
Potassium: 4.1 mEq/L (ref 3.5–5.1)
Sodium: 139 mEq/L (ref 135–145)
Total Bilirubin: 0.4 mg/dL (ref 0.2–1.2)
Total Protein: 7.3 g/dL (ref 6.0–8.3)

## 2020-04-06 LAB — LIPID PANEL
Cholesterol: 113 mg/dL (ref 0–200)
HDL: 39.8 mg/dL (ref >39.00)
LDL Cholesterol: 55 mg/dL (ref 0–99)
NonHDL: 72.89
Total CHOL/HDL Ratio: 3
Triglycerides: 89 mg/dL (ref 0.0–149.0)
VLDL: 17.8 mg/dL (ref 0.0–40.0)

## 2020-04-06 LAB — PSA: PSA: 0.65 ng/mL (ref 0.10–4.00)

## 2020-04-06 NOTE — Patient Instructions (Addendum)
Take metformin 500mg  two tablets twice per day ( total per day of 2000mg  per day)  Referral to podiatry  Let us know if you dont hear back within a week in regards to an appointment being scheduled.   Stay safe!

## 2020-04-06 NOTE — Assessment & Plan Note (Signed)
Uncontrolled. Advised to increase metformin which he had yet started since 11/2019.

## 2020-04-06 NOTE — Assessment & Plan Note (Signed)
Presume improved . Continue crestor.

## 2020-04-06 NOTE — Assessment & Plan Note (Signed)
DOE at baseline. Suspect related to deconditioning, copd, obesity. Will follow

## 2020-04-06 NOTE — Progress Notes (Signed)
Subjective:    Patient ID: Michael Boyle, male    DOB: 1966/05/15, 54 y.o.   MRN: [id number]  CC: Michael Boyle is a 54 y.o. male who presents today for follow up.   HPI: HTN- compliant with medications and potassium.  Taking kcl bid every day , but Friday. BLE edema unchanged. Improves with compression socks, elevation. No cp.  H/o svt-  No palpitations.   SOB at baseline with walking long distances, or several flights of stairs.   Left leg soreness resolved after doxcycline.   Right 5th toenail is ingrown, painful.  No purulent discharge. No numbness in feet or wounds.   DM- taking metformin 500mg  bid  HLD - taking crestor 4 days per week without myalgia.   No trouble urinating.       Follow up with Dr Fletcher Anon SVT.   Negative left DVT on [place] 02/21/20  HISTORY:  Past Medical History:  Diagnosis Date  . Colon polyps   . COPD (chronic obstructive pulmonary disease) ([place])   . Diabetes mellitus with complication ([place])   . Genetic testing 12/07/2017   Common Cancers panel (47 genes) @ [facility] - No pathogenic mutations detected  . GERD (gastroesophageal reflux disease)   . Gout   . Incomplete left bundle branch block   . Incomplete left bundle branch block (LBBB)    a. TTE 4/17: EF of 60-65%, unable to assess wall motion, normal LV diastolic function parameters, mildly calcifief mitral annulus  . Morbid obesity ([place])   . Palpitations    a. felt to be pSVT  . Palpitations   . Venous insufficiency    Past Surgical History:  Procedure Laterality Date  . COLONOSCOPY WITH PROPOFOL N/A 08/14/2015   Procedure: COLONOSCOPY WITH PROPOFOL;  Surgeon: Lollie Sails, MD;  Location: [facility] ENDOSCOPY;  Service: Endoscopy;  Laterality: N/A;  . COLONOSCOPY WITH PROPOFOL N/A 11/02/2017   Procedure: COLONOSCOPY WITH PROPOFOL;  Surgeon: Lollie Sails, MD;  Location: [facility] ENDOSCOPY;  Service: Endoscopy;  Laterality: N/A;  . COLONOSCOPY WITH PROPOFOL N/A 02/28/2020   Procedure:  COLONOSCOPY WITH PROPOFOL;  Surgeon: Jonathon Bellows, MD;  Location: [facility] ENDOSCOPY;  Service: Gastroenterology;  Laterality: N/A;  . ESOPHAGOGASTRODUODENOSCOPY (EGD) WITH PROPOFOL N/A 08/14/2015   Procedure: ESOPHAGOGASTRODUODENOSCOPY (EGD) WITH PROPOFOL;  Surgeon: Lollie Sails, MD;  Location[facility] ENDOSCOPY;  Service: Endoscopy;  Laterality: N/A;  . WISDOM TOOTH EXTRACTION     Family History  Problem Relation Age of Onset  . Cancer Sister        lymphoma dx 35s; currently 85  . Diabetes Sister   . Cirrhosis Sister   . Colon cancer Paternal Uncle        dx 72s; deceased 81  . Prostate cancer Paternal Uncle        dx 74s; currently 93    Allergies: Patient has no known allergies. Current Outpatient Medications on File Prior to Visit  Medication Sig Dispense Refill  . albuterol (VENTOLIN HFA) 108 (90 Base) MCG/ACT inhaler Inhale 2 puffs into the lungs every 6 (six) hours as needed for wheezing or shortness of breath. 1 Inhaler 2  . allopurinol (ZYLOPRIM) 100 MG tablet Take 1 tablet (100 mg total) by mouth 2 (two) times daily. 180 tablet 0  . aspirin EC 81 MG tablet Take 1 tablet (81 mg total) by mouth daily. 90 tablet 3  . Fluticasone-Umeclidin-Vilant (TRELEGY ELLIPTA) 200-62.5-25 MCG/INH AEPB Inhale 1 puff into the lungs daily. 60 each 2  . glucose  blood test strip Use as instructed 50 each 12  . losartan (COZAAR) 25 MG tablet Take 1 tablet (25 mg total) by mouth daily. 90 tablet 1  . metFORMIN (GLUCOPHAGE) 500 MG tablet Take 2 tablets (1,000 mg total) by mouth 2 (two) times daily with a meal. 180 tablet 0  . pantoprazole (PROTONIX) 40 MG tablet Take 1 tablet (40 mg total) by mouth daily. 90 tablet 3  . potassium chloride SA (KLOR-CON) 20 MEQ tablet Take one tablet twice per day on Monday, Tuesday, Thursday, Saturday & Sunday. Take one tablet per day on Wednesday & Friday. 48 tablet 1  . rosuvastatin (CRESTOR) 10 MG tablet Take 1 tablet (10 mg total) by mouth daily. 90 tablet 3  .  torsemide (DEMADEX) 10 MG tablet Take 2 TABLETS (20 mg total) by mouth 2 (two) times daily. 360 tablet 0  . verapamil (CALAN-SR) 180 MG CR tablet Take 1 tablet (180 mg total) by mouth 2 (two) times daily. 180 tablet 0   No current facility-administered medications on file prior to visit.    Social History   Tobacco Use  . Smoking status: Former Smoker    Packs/day: 1.75    Years: 30.00    Pack years: 52.50    Types: Cigarettes    Quit date: 06/13/2012    Years since quitting: 7.8  . Smokeless tobacco: Never Used  Vaping Use  . Vaping Use: Former  Substance Use Topics  . Alcohol use: No    Alcohol/week: 0.0 standard drinks  . Drug use: No    Review of Systems  Constitutional: Negative for chills and fever.  Respiratory: Positive for shortness of breath. Negative for cough.   Cardiovascular: Positive for leg swelling. Negative for chest pain and palpitations.  Gastrointestinal: Negative for nausea and vomiting.  Genitourinary: Negative for decreased urine volume and difficulty urinating.  Musculoskeletal: Negative for myalgias.  Skin: Negative for wound.  Neurological: Negative for numbness.      Objective:    BP 110/70   Pulse 60   Temp 98.3 F (36.8 C)   Ht 5\' 11"  (1.803 m)   Wt (!) 405 lb 9.6 oz (184 kg)   SpO2 96%   BMI 56.57 kg/m  BP Readings from Last 3 Encounters:  04/06/20 110/70  02/28/20 124/74  02/21/20 132/70   Wt Readings from Last 3 Encounters:  04/06/20 (!) 405 lb 9.6 oz (184 kg)  02/28/20 (!) 400 lb (181.4 kg)  02/21/20 (!) 410 lb 9.6 oz (186.2 kg)    Physical Exam Vitals reviewed.  Constitutional:      Appearance: He is well-developed.  Cardiovascular:     Rate and Rhythm: Regular rhythm.     Heart sounds: Normal heart sounds.     Comments: trace non pitting BLE edema. no palpable cords or masses. No erythema or increased warmth.  LE warm and palpable pedal pulses. Wearing compression stockings which he removed for foot  exam.  Pulmonary:     Effort: Pulmonary effort is normal. No respiratory distress.     Breath sounds: Normal breath sounds. No wheezing, rhonchi or rales.  Musculoskeletal:     Right lower leg: 1+ Edema present.     Left lower leg: 1+ Edema present.  Skin:    General: Skin is warm and dry.  Neurological:     Mental Status: He is alert.  Psychiatric:        Speech: Speech normal.        Behavior: Behavior normal.  Assessment & Plan:   Problem List Items Addressed This Visit      Cardiovascular and Mediastinum   Benign essential hypertension    Stable. Continue regimen      Relevant Orders   Comprehensive metabolic panel     Respiratory   COPD (chronic obstructive pulmonary disease) ([place])    DOE at baseline. Suspect related to deconditioning, copd, obesity. Will follow        Endocrine   Diabetes mellitus without complication ([place])    Uncontrolled. Advised to increase metformin which he had yet started since 11/2019.       Relevant Orders   Hemoglobin A1c   Lipid panel     Other   HLD (hyperlipidemia)    Presume improved . Continue crestor.        Other Visit Diagnoses    Need for immunization against influenza    -  Primary   Relevant Orders   Flu Vaccine QUAD 36+ mos IM (Completed)   Ingrown toenail       Relevant Orders   Ambulatory referral to Podiatry   Screening for prostate cancer       Relevant Orders   PSA   Encounter for hepatitis C screening test for low risk patient       Relevant Orders   Hepatitis C antibody   Screening for HIV (human immunodeficiency virus)       Relevant Orders   HIV Antibody (routine testing w rflx)       I have discontinued Geoffrey L. Olmo's doxycycline. I am also having him maintain his aspirin EC, albuterol, glucose blood, rosuvastatin, metFORMIN, Trelegy Ellipta, pantoprazole, potassium chloride SA, losartan, torsemide, verapamil, and allopurinol.   No orders of the defined types were placed in this  encounter.   Return precautions given.   Risks, benefits, and alternatives of the medications and treatment plan prescribed today were discussed, and patient expressed understanding.   Education regarding symptom management and diagnosis given to patient on AVS.  Continue to follow with Burnard Hawthorne, FNP for routine health maintenance.   Louann Liv and I agreed with plan.   Mable Paris, FNP

## 2020-04-06 NOTE — Assessment & Plan Note (Signed)
Stable. Continue regimen. 

## 2020-04-06 NOTE — Addendum Note (Signed)
Addended by: Leeanne Rio on: 04/06/2020 09:53 AM   Modules accepted: Orders

## 2020-04-06 NOTE — Addendum Note (Signed)
Addended by: Leeanne Rio on: 04/06/2020 09:54 AM   Modules accepted: Orders

## 2020-04-07 LAB — HEPATITIS C ANTIBODY
Hepatitis C Ab: NONREACTIVE
SIGNAL TO CUT-OFF: 0.04 (ref <1.00)

## 2020-04-07 LAB — HIV ANTIBODY (ROUTINE TESTING W REFLEX): HIV 1&2 Ab, 4th Generation: NONREACTIVE

## 2020-04-14 ENCOUNTER — Telehealth: Payer: Self-pay | Admitting: Family

## 2020-04-14 DIAGNOSIS — R21 Rash and other nonspecific skin eruption: Secondary | ICD-10-CM

## 2020-04-14 MED ORDER — VALACYCLOVIR HCL 1 G PO TABS: 1000.0000 mg | ORAL_TABLET | Freq: Three times a day (TID) | ORAL | 0 refills | Status: AC

## 2020-04-14 NOTE — Telephone Encounter (Signed)
Pt states that he has a rash and that it may be shingles. Hurts to touch or lay on. Pt is not sure on whether he needs to be treated or if it will go away on its own. There are no appts avail with any provider. Please advise

## 2020-04-14 NOTE — Telephone Encounter (Signed)
I spoke with patient & he stated that the rash is very sore & hurts to touch. He stated that it does not cross the midline & is only on the right side. Pt prefers to start antiviral then have 12p OV on Friday. I have scheduled.

## 2020-04-14 NOTE — Telephone Encounter (Signed)
Noted  Per Michael Boyle and of note, patient declined UC evaluation today I have sent in valtrex

## 2020-04-15 ENCOUNTER — Other Ambulatory Visit: Payer: Self-pay

## 2020-04-15 ENCOUNTER — Encounter: Payer: Self-pay | Admitting: Podiatry

## 2020-04-15 ENCOUNTER — Ambulatory Visit: Payer: BC Managed Care – PPO

## 2020-04-15 ENCOUNTER — Ambulatory Visit: Payer: BC Managed Care – PPO | Admitting: Podiatry

## 2020-04-15 DIAGNOSIS — L600 Ingrowing nail: Secondary | ICD-10-CM | POA: Diagnosis not present

## 2020-04-15 MED ORDER — NEOMYCIN-POLYMYXIN-HC 1 % OT SOLN
OTIC | 1 refills | Status: DC
Start: 2020-04-15 — End: 2021-01-19

## 2020-04-15 NOTE — Patient Instructions (Signed)

## 2020-04-15 NOTE — Progress Notes (Signed)
Subjective:  Patient ID: Michael Boyle, male    DOB: 10/21/1965,  MRN: [id number] HPI Chief Complaint  Patient presents with   Nail Problem    Patient presents today for ingown toenail right 5th toe lateral border x 2 weeks.   He says its a little tender to touch and no treatment has been done    54 y.o. male presents with the above complaint.   ROS: Denies fever chills nausea vomiting muscle aches pains calf pain back pain chest pain shortness of breath.  Past Medical History:  Diagnosis Date   Colon polyps    COPD (chronic obstructive pulmonary disease) ([place])    Diabetes mellitus with complication (HCC)    Genetic testing 12/07/2017   Common Cancers panel (47 genes) @ [facility] - No pathogenic mutations detected   GERD (gastroesophageal reflux disease)    Gout    Incomplete left bundle branch block    Incomplete left bundle branch block (LBBB)    a. TTE 4/17: EF of 60-65%, unable to assess wall motion, normal LV diastolic function parameters, mildly calcifief mitral annulus   Morbid obesity (HCC)    Palpitations    a. felt to be pSVT   Palpitations    Venous insufficiency    Past Surgical History:  Procedure Laterality Date   COLONOSCOPY WITH PROPOFOL N/A 08/14/2015   Procedure: COLONOSCOPY WITH PROPOFOL;  Surgeon: Lollie Sails, MD;  Location: [facility] ENDOSCOPY;  Service: Endoscopy;  Laterality: N/A;   COLONOSCOPY WITH PROPOFOL N/A 11/02/2017   Procedure: COLONOSCOPY WITH PROPOFOL;  Surgeon: Lollie Sails, MD;  Location: [facility] ENDOSCOPY;  Service: Endoscopy;  Laterality: N/A;   COLONOSCOPY WITH PROPOFOL N/A 02/28/2020   Procedure: COLONOSCOPY WITH PROPOFOL;  Surgeon: Jonathon Bellows, MD;  Location: [facility] ENDOSCOPY;  Service: Gastroenterology;  Laterality: N/A;   ESOPHAGOGASTRODUODENOSCOPY (EGD) WITH PROPOFOL N/A 08/14/2015   Procedure: ESOPHAGOGASTRODUODENOSCOPY (EGD) WITH PROPOFOL;  Surgeon: Lollie Sails, MD;  Location: [facility] ENDOSCOPY;  Service: Endoscopy;   Laterality: N/A;   WISDOM TOOTH EXTRACTION      Current Outpatient Medications:    albuterol (VENTOLIN HFA) 108 (90 Base) MCG/ACT inhaler, Inhale 2 puffs into the lungs every 6 (six) hours as needed for wheezing or shortness of breath., Disp: 1 Inhaler, Rfl: 2   allopurinol (ZYLOPRIM) 100 MG tablet, Take 1 tablet (100 mg total) by mouth 2 (two) times daily., Disp: 180 tablet, Rfl: 0   aspirin EC 81 MG tablet, Take 1 tablet (81 mg total) by mouth daily., Disp: 90 tablet, Rfl: 3   Fluticasone-Umeclidin-Vilant (TRELEGY ELLIPTA) 200-62.5-25 MCG/INH AEPB, Inhale 1 puff into the lungs daily., Disp: 60 each, Rfl: 2   glucose blood test strip, Use as instructed, Disp: 50 each, Rfl: 12   losartan (COZAAR) 25 MG tablet, Take 1 tablet (25 mg total) by mouth daily., Disp: 90 tablet, Rfl: 1   metFORMIN (GLUCOPHAGE) 500 MG tablet, Take 2 tablets (1,000 mg total) by mouth 2 (two) times daily with a meal., Disp: 180 tablet, Rfl: 0   NEOMYCIN-POLYMYXIN-HYDROCORTISONE (CORTISPORIN) 1 % SOLN OTIC solution, Apply 1-2 drops to toe BID after soaking, Disp: 10 mL, Rfl: 1   pantoprazole (PROTONIX) 40 MG tablet, Take 1 tablet (40 mg total) by mouth daily., Disp: 90 tablet, Rfl: 3   potassium chloride SA (KLOR-CON) 20 MEQ tablet, Take one tablet twice per day on Monday, Tuesday, Thursday, Saturday & Sunday. Take one tablet per day on Wednesday & Friday., Disp: 48 tablet, Rfl: 1   rosuvastatin (CRESTOR) 10  MG tablet, Take 1 tablet (10 mg total) by mouth daily., Disp: 90 tablet, Rfl: 3   torsemide (DEMADEX) 10 MG tablet, Take 2 TABLETS (20 mg total) by mouth 2 (two) times daily., Disp: 360 tablet, Rfl: 0   valACYclovir (VALTREX) 1000 MG tablet, Take 1 tablet (1,000 mg total) by mouth 3 (three) times daily for 7 days., Disp: 21 tablet, Rfl: 0   verapamil (CALAN-SR) 180 MG CR tablet, Take 1 tablet (180 mg total) by mouth 2 (two) times daily., Disp: 180 tablet, Rfl: 0  No Known Allergies Review of  Systems Objective:  There were no vitals filed for this visit.  General: Well developed, nourished, in no acute distress, alert and oriented x3   Dermatological: Skin is warm, dry and supple bilateral. Nails x 10 are well maintained; remaining integument appears unremarkable at this time. There are no open sores, no preulcerative lesions, no rash or signs of infection present.  Sharp incurvated toenail tibiofibular border fifth digit right foot.  Mild erythema laterally.  No purulence no malodor.  Vascular: Dorsalis Pedis artery and Posterior Tibial artery pedal pulses are 2/4 bilateral with immedate capillary fill time. Pedal hair growth present. No varicosities and no lower extremity edema present bilateral.   Neruologic: Grossly intact via light touch bilateral. Vibratory intact via tuning fork bilateral. Protective threshold with Semmes Wienstein monofilament intact to all pedal sites bilateral. Patellar and Achilles deep tendon reflexes 2+ bilateral. No Babinski or clonus noted bilateral.   Musculoskeletal: No gross boney pedal deformities bilateral. No pain, crepitus, or limitation noted with foot and ankle range of motion bilateral. Muscular strength 5/5 in all groups tested bilateral.  Gait: Unassisted, Nonantalgic.    Radiographs:  None taken  Assessment & Plan:   Assessment: Ingrown toenail fifth digit right foot fibular border.  Plan: Chemical matricectomy lateral border fifth digit right foot.  Tolerated procedure well after local anesthetic was administered.  Was given both oral and written home-going instructions of care and soaking of the toe as well as a prescription for Cortisporin Otic to be applied twice daily after soaking.  Follow-up with him in 2 to 3 weeks.     Janes Colegrove T. [place], [place]

## 2020-04-17 ENCOUNTER — Other Ambulatory Visit: Payer: Self-pay

## 2020-04-17 ENCOUNTER — Ambulatory Visit (INDEPENDENT_AMBULATORY_CARE_PROVIDER_SITE_OTHER): Payer: BC Managed Care – PPO | Admitting: Family

## 2020-04-17 ENCOUNTER — Encounter: Payer: Self-pay | Admitting: Family

## 2020-04-17 DIAGNOSIS — B029 Zoster without complications: Secondary | ICD-10-CM | POA: Diagnosis not present

## 2020-04-17 NOTE — Assessment & Plan Note (Signed)
Improving. Patient well appearing and nontoxic. Complete valtrex and he will let me know of any concerns.

## 2020-04-17 NOTE — Progress Notes (Signed)
Subjective:    Patient ID: Michael Boyle, male    DOB: Jul 11, 1966, 54 y.o.   MRN: [id number]  CC: Michael Boyle is a 54 y.o. male who presents today for an acute visit.    HPI: Right flank vesciular rash x 6 days, improving. Day prior to rash presentation, started to have irritated and numb.  Sore to touch even shirt touches it.  Non pruritic.   No dysuria, hematuria, fever, HA, vision changes.   Never had shingles before.       HISTORY:  Past Medical History:  Diagnosis Date  . Colon polyps   . COPD (chronic obstructive pulmonary disease) (Michael Boyle)   . Diabetes mellitus with complication (Michael Boyle)   . Genetic testing 12/07/2017   Common Cancers panel (47 genes) @ [facility] - No pathogenic mutations detected  . GERD (gastroesophageal reflux disease)   . Gout   . Incomplete left bundle branch block   . Incomplete left bundle branch block (LBBB)    a. TTE 4/17: EF of 60-65%, unable to assess wall motion, normal LV diastolic function parameters, mildly calcifief mitral annulus  . Morbid obesity (Michael Boyle)   . Palpitations    a. felt to be pSVT  . Palpitations   . Venous insufficiency    Past Surgical History:  Procedure Laterality Date  . COLONOSCOPY WITH PROPOFOL N/A 08/14/2015   Procedure: COLONOSCOPY WITH PROPOFOL;  Surgeon: Michael Sails, MD;  Location: [facility] ENDOSCOPY;  Service: Endoscopy;  Laterality: N/A;  . COLONOSCOPY WITH PROPOFOL N/A 11/02/2017   Procedure: COLONOSCOPY WITH PROPOFOL;  Surgeon: Michael Sails, MD;  Location: [facility] ENDOSCOPY;  Service: Endoscopy;  Laterality: N/A;  . COLONOSCOPY WITH PROPOFOL N/A 02/28/2020   Procedure: COLONOSCOPY WITH PROPOFOL;  Surgeon: Michael Bellows, MD;  Location: [facility] ENDOSCOPY;  Service: Gastroenterology;  Laterality: N/A;  . ESOPHAGOGASTRODUODENOSCOPY (EGD) WITH PROPOFOL N/A 08/14/2015   Procedure: ESOPHAGOGASTRODUODENOSCOPY (EGD) WITH PROPOFOL;  Surgeon: Michael Sails, MD;  Location: [facility] ENDOSCOPY;  Service: Endoscopy;   Laterality: N/A;  . WISDOM TOOTH EXTRACTION     Family History  Problem Relation Age of Onset  . Cancer Sister        lymphoma dx 10s; currently 51  . Diabetes Sister   . Cirrhosis Sister   . Colon cancer Paternal Uncle        dx 59s; deceased 33  . Prostate cancer Paternal Uncle        dx 24s; currently 15    Allergies: Patient has no known allergies. Current Outpatient Medications on File Prior to Visit  Medication Sig Dispense Refill  . albuterol (VENTOLIN HFA) 108 (90 Base) MCG/ACT inhaler Inhale 2 puffs into the lungs every 6 (six) hours as needed for wheezing or shortness of breath. 1 Inhaler 2  . allopurinol (ZYLOPRIM) 100 MG tablet Take 1 tablet (100 mg total) by mouth 2 (two) times daily. 180 tablet 0  . aspirin EC 81 MG tablet Take 1 tablet (81 mg total) by mouth daily. 90 tablet 3  . Fluticasone-Umeclidin-Vilant (TRELEGY ELLIPTA) 200-62.5-25 MCG/INH AEPB Inhale 1 puff into the lungs daily. 60 each 2  . glucose blood test strip Use as instructed 50 each 12  . losartan (COZAAR) 25 MG tablet Take 1 tablet (25 mg total) by mouth daily. 90 tablet 1  . metFORMIN (GLUCOPHAGE) 500 MG tablet Take 2 tablets (1,000 mg total) by mouth 2 (two) times daily with a meal. 180 tablet 0  . NEOMYCIN-POLYMYXIN-HYDROCORTISONE (CORTISPORIN) 1 % SOLN OTIC solution  Apply 1-2 drops to toe BID after soaking 10 mL 1  . pantoprazole (PROTONIX) 40 MG tablet Take 1 tablet (40 mg total) by mouth daily. 90 tablet 3  . potassium chloride SA (KLOR-CON) 20 MEQ tablet Take one tablet twice per day on Monday, Tuesday, Thursday, Saturday & Sunday. Take one tablet per day on Wednesday & Friday. 48 tablet 1  . rosuvastatin (CRESTOR) 10 MG tablet Take 1 tablet (10 mg total) by mouth daily. 90 tablet 3  . torsemide (DEMADEX) 10 MG tablet Take 2 TABLETS (20 mg total) by mouth 2 (two) times daily. 360 tablet 0  . valACYclovir (VALTREX) 1000 MG tablet Take 1 tablet (1,000 mg total) by mouth 3 (three) times daily for 7  days. 21 tablet 0  . verapamil (CALAN-SR) 180 MG CR tablet Take 1 tablet (180 mg total) by mouth 2 (two) times daily. 180 tablet 0   No current facility-administered medications on file prior to visit.    Social History   Tobacco Use  . Smoking status: Former Smoker    Packs/day: 1.75    Years: 30.00    Pack years: 52.50    Types: Cigarettes    Quit date: 06/13/2012    Years since quitting: 7.8  . Smokeless tobacco: Never Used  Vaping Use  . Vaping Use: Former  Substance Use Topics  . Alcohol use: No    Alcohol/week: 0.0 standard drinks  . Drug use: No    Review of Systems  Constitutional: Negative for chills, fever and unexpected weight change.  Respiratory: Negative for cough.   Cardiovascular: Negative for chest pain and palpitations.  Gastrointestinal: Negative for nausea and vomiting.  Genitourinary: Negative for difficulty urinating and hematuria.  Skin: Positive for rash.  Neurological: Negative for headaches.      Objective:    BP 110/60 (BP Location: Left Arm, Patient Position: Sitting, Cuff Size: Large) Comment (Cuff Size): Thigh cuff  Pulse 61   Temp 98.6 F (37 C)   Ht 5\' 11"  (1.803 m)   Wt (!) 402 lb 6.4 oz (182.5 kg)   SpO2 96%   BMI 56.12 kg/m    Physical Exam Vitals reviewed.  Constitutional:      Appearance: He is well-developed.  Cardiovascular:     Rate and Rhythm: Regular rhythm.     Heart sounds: Normal heart sounds.  Pulmonary:     Effort: Pulmonary effort is normal. No respiratory distress.     Breath sounds: Normal breath sounds. No wheezing, rhonchi or rales.  Skin:    General: Skin is warm and dry.     Findings: Rash present. Rash is vesicular.     Comments: vesicular to dried lesions following right flank dermatomal pattern. Doesn't cross midline.  Neurological:     Mental Status: He is alert.  Psychiatric:        Speech: Speech normal.        Behavior: Behavior normal.        Assessment & Plan:    The encounter  diagnosis was Herpes zoster without complication. Problem List Items Addressed This Visit      Other   Zoster    Improving. Patient well appearing and nontoxic. Complete valtrex and he will let me know of any concerns.           I am having Keishawn L. Palmeri maintain his aspirin EC, albuterol, glucose blood, rosuvastatin, metFORMIN, Trelegy Ellipta, pantoprazole, potassium chloride SA, losartan, torsemide, verapamil, allopurinol, valACYclovir, and NEOMYCIN-POLYMYXIN-HYDROCORTISONE.  No orders of the defined types were placed in this encounter.   Return precautions given.   Risks, benefits, and alternatives of the medications and treatment plan prescribed today were discussed, and patient expressed understanding.   Education regarding symptom management and diagnosis given to patient on AVS.  Continue to follow with Burnard Hawthorne, FNP for routine health maintenance.   Louann Liv and I agreed with plan.   Mable Paris, FNP

## 2020-04-17 NOTE — Patient Instructions (Signed)
Glad its healing Complete valtrex Start capsaicin topical OTC if needed once healed.   Stay safe!

## 2020-04-21 ENCOUNTER — Inpatient Hospital Stay: Payer: BC Managed Care – PPO | Attending: Oncology | Admitting: Nurse Practitioner

## 2020-04-21 ENCOUNTER — Ambulatory Visit
Admission: RE | Admit: 2020-04-21 | Discharge: 2020-04-21 | Disposition: A | Discharge status: Home / Self Care | Payer: BC Managed Care – PPO | Source: Ambulatory Visit | Attending: Nurse Practitioner | Admitting: Nurse Practitioner

## 2020-04-21 ENCOUNTER — Other Ambulatory Visit: Payer: Self-pay

## 2020-04-21 DIAGNOSIS — Z122 Encounter for screening for malignant neoplasm of respiratory organs: Secondary | ICD-10-CM | POA: Diagnosis not present

## 2020-04-21 DIAGNOSIS — Z87891 Personal history of nicotine dependence: Secondary | ICD-10-CM

## 2020-04-21 NOTE — Progress Notes (Signed)
Virtual Visit via Video Enabled Telemedicine Note   I connected with Michael Boyle on 04/21/20 at 1:30 PM EST by video enabled telemedicine visit and verified that I am speaking with the correct person using two identifiers.   I discussed the limitations, risks, security and privacy concerns of performing an evaluation and management service by telemedicine and the availability of in-person appointments. I also discussed with the patient that there may be a patient responsible charge related to this service. The patient expressed understanding and agreed to proceed.   Other persons participating in the visit and their role in the encounter: [name], RN- checking in patient & navigation  Patient's location: [place]  Provider's location: Clinic  Chief Complaint: Low Dose CT Screening  Patient agreed to evaluation by telemedicine to discuss shared decision making for consideration of low dose CT lung cancer screening.    In accordance with CMS guidelines, patient has met eligibility criteria including age, absence of signs or symptoms of lung cancer.  Social History   Tobacco Use  . Smoking status: Former Smoker    Packs/day: 1.75    Years: 30.00    Pack years: 52.50    Types: Cigarettes    Quit date: 06/13/2012    Years since quitting: 7.8  . Smokeless tobacco: Never Used  Substance Use Topics  . Alcohol use: No    Alcohol/week: 0.0 standard drinks     A shared decision-making session was conducted prior to the performance of CT scan. This includes one or more decision aids, includes benefits and harms of screening, follow-up diagnostic testing, over-diagnosis, false positive rate, and total radiation exposure.   Counseling on the importance of adherence to annual lung cancer LDCT screening, impact of co-morbidities, and ability or willingness to undergo diagnosis and treatment is imperative for compliance of the program.   Counseling on the importance of continued  smoking cessation for former smokers; the importance of smoking cessation for current smokers, and information about tobacco cessation interventions have been given to patient including [place] and [employer] [place] programs.   Written order for lung cancer screening with LDCT has been given to the patient and any and all questions have been answered to the best of my abilities.    Yearly follow up will be coordinated by [name], Thoracic Navigator.  I discussed the assessment and treatment plan with the patient. The patient was provided an opportunity to ask questions and all were answered. The patient agreed with the plan and demonstrated an understanding of the instructions.   The patient was advised to call back or seek an in-person evaluation if the symptoms worsen or if the condition fails to improve as anticipated.   I provided 15 minutes of face-to-face video visit time during this encounter, and > 50% was spent counseling as documented under my assessment & plan.   [name], DNP, AGNP-C [place] at [facility] [phone] (clinic)

## 2020-04-24 ENCOUNTER — Encounter: Payer: Self-pay | Admitting: *Deleted

## 2020-04-24 ENCOUNTER — Telehealth: Payer: Self-pay | Admitting: Family

## 2020-04-24 DIAGNOSIS — I700 Atherosclerosis of aorta: Secondary | ICD-10-CM | POA: Insufficient documentation

## 2020-04-24 NOTE — Telephone Encounter (Signed)
Letter sent to patient.

## 2020-04-24 NOTE — Telephone Encounter (Signed)
Please mail letter to patient:   Michael Boyle, [place] you are doing well.   I received results from your annual CT lung scan from [facility]  It was noted on the exam that you have coronary artery atherosclerosis, or often referred to as hardening of the arteries.   This can put you at risk for stroke, heart attack.   Please continue Crestor for your cholesterol and make a follow up appointment with me if you would like to further discuss your cardiac risk after seeing atherosclerosis.   I would also like to ensure you have had a recent lipid panel ( fasting blood work) to ensure your cholesterol is at goal. I would very much advise that you make a follow up with [facility] cardiology whom  You have seen in the past, last 01/2019  Look forward to seeing you!   Best,   Michael Paris, NP

## 2020-04-29 ENCOUNTER — Ambulatory Visit: Payer: BC Managed Care – PPO | Admitting: Podiatry

## 2020-04-30 ENCOUNTER — Other Ambulatory Visit: Payer: Self-pay

## 2020-04-30 ENCOUNTER — Ambulatory Visit: Payer: BC Managed Care – PPO | Admitting: Cardiovascular Disease

## 2020-04-30 ENCOUNTER — Encounter: Payer: Self-pay | Admitting: Cardiovascular Disease

## 2020-04-30 VITALS — BP 118/72 | HR 60 | Ht 71.0 in | Wt >= 6400 oz

## 2020-04-30 DIAGNOSIS — I471 Supraventricular tachycardia, unspecified: Secondary | ICD-10-CM

## 2020-04-30 DIAGNOSIS — E785 Hyperlipidemia, unspecified: Secondary | ICD-10-CM | POA: Diagnosis not present

## 2020-04-30 DIAGNOSIS — I10 Essential (primary) hypertension: Secondary | ICD-10-CM | POA: Diagnosis not present

## 2020-04-30 NOTE — Progress Notes (Signed)
Cardiology Office Note   Date:  04/30/2020   ID:  Michael Boyle, DOB Jul 19, 1966, MRN [id number]  PCP:  Burnard Hawthorne, FNP  Cardiologist:  New   Chief Complaint  Patient presents with  . Follow-up    Annual follow up. Patient c/o DOE and bilateral leg swelling. Medications verbally reviewed with patient.       History of Present Illness: Michael Boyle is a 54 y.o. male who is here today for follow-up visit regarding paroxysmal supraventricular tachycardia.  He has been on verapamil for many years with no recurrent episodes. He has chronic medical conditions that include chronic leg edema, morbid obesity, type 2 diabetes, hyperlipidemia and previous tobacco use.  He has history of intermittent incomplete left bundle branch block. He has been doing reasonably well with no recent chest pain.  He continues to have significant exertional dyspnea.  He also had worsening swelling in the left leg but Doppler showed no evidence of DVT. He had an echocardiogram done in June which showed normal LV systolic function with no significant valvular abnormalities. CT scan for cancer screening showed evidence of aortic and coronary atherosclerosis.  Past Medical History:  Diagnosis Date  . Colon polyps   . COPD (chronic obstructive pulmonary disease) ([place])   . Diabetes mellitus with complication ([place])   . Genetic testing 12/07/2017   Common Cancers panel (47 genes) @ [facility] - No pathogenic mutations detected  . GERD (gastroesophageal reflux disease)   . Gout   . Incomplete left bundle branch block   . Incomplete left bundle branch block (LBBB)    a. TTE 4/17: EF of 60-65%, unable to assess wall motion, normal LV diastolic function parameters, mildly calcifief mitral annulus  . Morbid obesity ([place])   . Palpitations    a. felt to be pSVT  . Palpitations   . Venous insufficiency     Past Surgical History:  Procedure Laterality Date  . COLONOSCOPY WITH PROPOFOL N/A 08/14/2015    Procedure: COLONOSCOPY WITH PROPOFOL;  Surgeon: Lollie Sails, MD;  Location: [facility] ENDOSCOPY;  Service: Endoscopy;  Laterality: N/A;  . COLONOSCOPY WITH PROPOFOL N/A 11/02/2017   Procedure: COLONOSCOPY WITH PROPOFOL;  Surgeon: Lollie Sails, MD;  Location: [facility] ENDOSCOPY;  Service: Endoscopy;  Laterality: N/A;  . COLONOSCOPY WITH PROPOFOL N/A 02/28/2020   Procedure: COLONOSCOPY WITH PROPOFOL;  Surgeon: Jonathon Bellows, MD;  Location: [facility] ENDOSCOPY;  Service: Gastroenterology;  Laterality: N/A;  . ESOPHAGOGASTRODUODENOSCOPY (EGD) WITH PROPOFOL N/A 08/14/2015   Procedure: ESOPHAGOGASTRODUODENOSCOPY (EGD) WITH PROPOFOL;  Surgeon: Lollie Sails, MD;  Location: [facility] ENDOSCOPY;  Service: Endoscopy;  Laterality: N/A;  . WISDOM TOOTH EXTRACTION       Current Outpatient Medications  Medication Sig Dispense Refill  . albuterol (VENTOLIN HFA) 108 (90 Base) MCG/ACT inhaler Inhale 2 puffs into the lungs every 6 (six) hours as needed for wheezing or shortness of breath. 1 Inhaler 2  . allopurinol (ZYLOPRIM) 100 MG tablet Take 1 tablet (100 mg total) by mouth 2 (two) times daily. 180 tablet 0  . aspirin EC 81 MG tablet Take 1 tablet (81 mg total) by mouth daily. 90 tablet 3  . Fluticasone-Umeclidin-Vilant (TRELEGY ELLIPTA) 200-62.5-25 MCG/INH AEPB Inhale 1 puff into the lungs daily. 60 each 2  . glucose blood test strip Use as instructed 50 each 12  . losartan (COZAAR) 25 MG tablet Take 1 tablet (25 mg total) by mouth daily. 90 tablet 1  . metFORMIN (GLUCOPHAGE) 500 MG tablet Take  2 tablets (1,000 mg total) by mouth 2 (two) times daily with a meal. 180 tablet 0  . NEOMYCIN-POLYMYXIN-HYDROCORTISONE (CORTISPORIN) 1 % SOLN OTIC solution Apply 1-2 drops to toe BID after soaking 10 mL 1  . pantoprazole (PROTONIX) 40 MG tablet Take 1 tablet (40 mg total) by mouth daily. 90 tablet 3  . potassium chloride SA (KLOR-CON) 20 MEQ tablet Take one tablet twice per day on Monday, Tuesday, Thursday, Saturday & Sunday.  Take one tablet per day on Wednesday & Friday. 48 tablet 1  . rosuvastatin (CRESTOR) 10 MG tablet Take 1 tablet (10 mg total) by mouth daily. 90 tablet 3  . torsemide (DEMADEX) 10 MG tablet Take 2 TABLETS (20 mg total) by mouth 2 (two) times daily. 360 tablet 0  . verapamil (CALAN-SR) 180 MG CR tablet Take 1 tablet (180 mg total) by mouth 2 (two) times daily. 180 tablet 0   No current facility-administered medications for this visit.    Allergies:   Patient has no known allergies.    Social History:  The patient  reports that he quit smoking about 7 years ago. His smoking use included cigarettes. He has a 52.50 pack-year smoking history. He has never used smokeless tobacco. He reports that he does not drink alcohol and does not use drugs.   Family History:  The patient's family history includes Cancer in his sister; Cirrhosis in his sister; Colon cancer in his paternal uncle; Diabetes in his sister; Prostate cancer in his paternal uncle.    ROS:  Please see the history of present illness.   Otherwise, review of systems are positive for none.   All other systems are reviewed and negative.    PHYSICAL EXAM: VS:  BP 118/72 (BP Location: Left Arm, Patient Position: Sitting, Cuff Size: Large)   Pulse 60   Ht 5\' 11"  (1.803 m)   Wt (!) 408 lb (185.1 kg)   SpO2 93%   BMI 56.90 kg/m  , BMI Body mass index is 56.9 kg/m. GEN: Well nourished, well developed, in no acute distress  HEENT: normal  Neck: no JVD, carotid bruits, or masses Cardiac: RRR; no murmurs, rubs, or gallops. There is mild leg edema  Respiratory:  clear to auscultation bilaterally, normal work of breathing GI: soft, nontender, nondistended, + BS MS: no deformity or atrophy  Skin: warm and dry, no rash Neuro:  Strength and sensation are intact Psych: euthymic mood, full affect   EKG:  EKG is ordered today. The ekg ordered today demonstrates normal sinus rhythm with no significant ST or T wave changes.   Recent  Labs: 06/06/2019: Hemoglobin 14.6; Platelets 334.0 04/06/2020: ALT 18; BUN 12; Creatinine, Ser 0.89; Potassium 4.1; Sodium 139    Lipid Panel    Component Value Date/Time   CHOL 113 04/06/2020 0952   CHOL 184 09/23/2015 1011   TRIG 89.0 04/06/2020 0952   HDL 39.80 04/06/2020 0952   HDL 37 (L) 09/23/2015 1011   CHOLHDL 3 04/06/2020 0952   VLDL 17.8 04/06/2020 0952   LDLCALC 55 04/06/2020 0952   LDLCALC 108 (H) 04/24/2017 1415      Wt Readings from Last 3 Encounters:  04/30/20 (!) 408 lb (185.1 kg)  04/21/20 (!) 402 lb (182.3 kg)  04/17/20 (!) 402 lb 6.4 oz (182.5 kg)       ASSESSMENT AND PLAN:  1.  Paroxysmal supraventricular tachycardia: No recurrent episodes on verapamil.  2.  Coronary atherosclerosis: This was noted on CT scan of the lungs.  I explained to him that this establishes the diagnosis of atherosclerosis but does not give [place] an idea whether he has obstructive coronary artery disease accounting for some of his exertional dyspnea or not.  I explained to him that stress testing is going to be somewhat limited given his morbid obesity.  He is not able to exercise on a treadmill.  Lexiscan Myoview will have significant limitations in the same for CTA of the coronary arteries.  Realistically speaking, cardiac catheterization will be the only test that will provide that kind of information in his situation.  I discussed this with him and he does not feel that there has been significant worsening of exertional dyspnea and will continue to monitor his symptoms and treat his risk factors.  Continue small dose aspirin.  3. Morbid obesity: Discussed the importance of healthy lifestyle changes and he wants to initiate that.  4.  Hyperlipidemia: Continue treatment with rosuvastatin.  Most recent lipid profile was excellent.    Disposition:   FU with me in 1 year.   Signed,  Kathlyn Sacramento, MD  04/30/2020 8:29 AM    [facility]

## 2020-04-30 NOTE — Patient Instructions (Signed)
Medication Instructions:  Your physician recommends that you continue on your current medications as directed. Please refer to the Current Medication list given to you today.  *If you need a refill on your cardiac medications before your next appointment, please call your pharmacy*   Lab Work: None ordered If you have labs (blood work) drawn today and your tests are completely normal, you will receive your results only by: . MyChart Message (if you have MyChart) OR . A paper copy in the mail If you have any lab test that is abnormal or we need to change your treatment, we will call you to review the results.   Testing/Procedures: None ordered   Follow-Up: At [facility], you and your health needs are our priority.  As part of our continuing mission to provide you with exceptional heart care, we have created designated Provider Care Teams.  These Care Teams include your primary Cardiologist (physician) and Advanced Practice Providers (APPs -  Physician Assistants and Nurse Practitioners) who all work together to provide you with the care you need, when you need it.  We recommend signing up for the patient portal called "MyChart".  Sign up information is provided on this After Visit Summary.  MyChart is used to connect with patients for Virtual Visits (Telemedicine).  Patients are able to view lab/test results, encounter notes, upcoming appointments, etc.  Non-urgent messages can be sent to your provider as well.   To learn more about what you can do with MyChart, go to [email or web address].    Your next appointment:   12 month(s)  The format for your next appointment:   In Person  Provider:   You may see Muhammad Arida, MD or one of the following Advanced Practice Providers on your designated Care Team:    Christopher Berge, NP  Ryan Dunn, PA-C  Jacquelyn Visser, PA-C  Cadence Furth, PA-C    Other Instructions N/A  

## 2020-05-06 ENCOUNTER — Other Ambulatory Visit: Payer: Self-pay | Admitting: Family

## 2020-05-06 DIAGNOSIS — T502X5A Adverse effect of carbonic-anhydrase inhibitors, benzothiadiazides and other diuretics, initial encounter: Secondary | ICD-10-CM

## 2020-05-06 DIAGNOSIS — E119 Type 2 diabetes mellitus without complications: Secondary | ICD-10-CM

## 2020-05-07 ENCOUNTER — Other Ambulatory Visit: Payer: Self-pay | Admitting: Family

## 2020-05-08 ENCOUNTER — Other Ambulatory Visit: Payer: Self-pay

## 2020-05-08 MED ORDER — TRELEGY ELLIPTA 200-62.5-25 MCG/INH IN AEPB: 1.0000 | INHALATION_SPRAY | Freq: Every day | RESPIRATORY_TRACT | 1 refills | Status: DC

## 2020-06-04 ENCOUNTER — Other Ambulatory Visit: Payer: Self-pay | Admitting: Family

## 2020-06-04 DIAGNOSIS — E876 Hypokalemia: Secondary | ICD-10-CM

## 2020-06-24 ENCOUNTER — Other Ambulatory Visit: Payer: Self-pay

## 2020-06-24 ENCOUNTER — Other Ambulatory Visit (INDEPENDENT_AMBULATORY_CARE_PROVIDER_SITE_OTHER): Payer: BC Managed Care – PPO

## 2020-06-24 DIAGNOSIS — E119 Type 2 diabetes mellitus without complications: Secondary | ICD-10-CM | POA: Diagnosis not present

## 2020-06-24 LAB — HEMOGLOBIN A1C: Hgb A1c MFr Bld: 7.4 % — ABNORMAL HIGH (ref 4.6–6.5)

## 2020-06-29 ENCOUNTER — Other Ambulatory Visit: Payer: Self-pay | Admitting: Family

## 2020-06-29 DIAGNOSIS — E876 Hypokalemia: Secondary | ICD-10-CM

## 2020-06-29 DIAGNOSIS — M7989 Other specified soft tissue disorders: Secondary | ICD-10-CM

## 2020-06-29 DIAGNOSIS — T502X5A Adverse effect of carbonic-anhydrase inhibitors, benzothiadiazides and other diuretics, initial encounter: Secondary | ICD-10-CM

## 2020-06-29 DIAGNOSIS — I479 Paroxysmal tachycardia, unspecified: Secondary | ICD-10-CM

## 2020-06-29 DIAGNOSIS — M1A2720 Drug-induced chronic gout, left ankle and foot, without tophus (tophi): Secondary | ICD-10-CM

## 2020-07-08 ENCOUNTER — Ambulatory Visit: Payer: BC Managed Care – PPO | Admitting: Family

## 2020-07-09 ENCOUNTER — Other Ambulatory Visit: Payer: Self-pay | Admitting: Family

## 2020-07-09 DIAGNOSIS — I10 Essential (primary) hypertension: Secondary | ICD-10-CM

## 2020-07-09 DIAGNOSIS — E119 Type 2 diabetes mellitus without complications: Secondary | ICD-10-CM

## 2020-07-10 ENCOUNTER — Other Ambulatory Visit: Payer: Self-pay

## 2020-07-10 DIAGNOSIS — I10 Essential (primary) hypertension: Secondary | ICD-10-CM

## 2020-07-10 DIAGNOSIS — E119 Type 2 diabetes mellitus without complications: Secondary | ICD-10-CM

## 2020-07-10 MED ORDER — LOSARTAN POTASSIUM 25 MG PO TABS: 25.0000 mg | ORAL_TABLET | Freq: Every day | ORAL | 0 refills | Status: DC

## 2020-07-10 MED ORDER — METFORMIN HCL 500 MG PO TABS: ORAL_TABLET | ORAL | 0 refills | Status: DC

## 2020-08-10 ENCOUNTER — Encounter: Payer: Self-pay | Admitting: Family

## 2020-08-10 ENCOUNTER — Other Ambulatory Visit: Payer: Self-pay | Admitting: Family

## 2020-08-10 ENCOUNTER — Encounter: Payer: BC Managed Care – PPO | Admitting: Family

## 2020-08-10 ENCOUNTER — Telehealth: Payer: Self-pay

## 2020-08-10 NOTE — Telephone Encounter (Signed)
Pt tested positive for covid on 08/04/20. Pt states that he needs advise on what else he can do to alleviate symptoms. Pt has been taking mucinex DM for about 4-5 days now. Fever has subsided. Please call at your earliest convenience.

## 2020-08-10 NOTE — Telephone Encounter (Signed)
Call pt Please triage for sxs.  Sob, cough , fever Able to eat and drink? Doesn't appear he is fully vaccinated, please confirm  Advise continued use of mucinex and vitamins below  May work him into lunch

## 2020-08-10 NOTE — Telephone Encounter (Signed)
I scheduled patient for VV at 11:30, but called to triage. LMTCB & wanted to make sure he was okay to do appointment.

## 2020-08-10 NOTE — Telephone Encounter (Signed)
Spoke with patient 6 days ago, congestion, fever, HA. 'feels like over the hump.' Has been taking mucinex and tylenol.  Tmax 102, last fever 3 days ago. HA resolved.  Covid home test 2 days ago No sob, wheezing.  Not covid vaccinated  Sent mychart regarding quarantine instructions Out of window for MAI.

## 2020-08-11 NOTE — Progress Notes (Signed)
This encounter was created in error - please disregard.

## 2020-08-25 ENCOUNTER — Ambulatory Visit: Payer: BC Managed Care – PPO | Admitting: Family

## 2020-08-27 ENCOUNTER — Other Ambulatory Visit: Payer: Self-pay | Admitting: Family

## 2020-08-27 DIAGNOSIS — T502X5A Adverse effect of carbonic-anhydrase inhibitors, benzothiadiazides and other diuretics, initial encounter: Secondary | ICD-10-CM

## 2020-08-27 DIAGNOSIS — E876 Hypokalemia: Secondary | ICD-10-CM

## 2020-08-31 ENCOUNTER — Other Ambulatory Visit: Payer: Self-pay

## 2020-08-31 DIAGNOSIS — E876 Hypokalemia: Secondary | ICD-10-CM

## 2020-08-31 MED ORDER — POTASSIUM CHLORIDE CRYS ER 20 MEQ PO TBCR: EXTENDED_RELEASE_TABLET | ORAL | 2 refills | Status: DC

## 2020-09-11 ENCOUNTER — Ambulatory Visit: Payer: BC Managed Care – PPO | Admitting: Family

## 2020-09-11 ENCOUNTER — Other Ambulatory Visit: Payer: Self-pay

## 2020-09-11 ENCOUNTER — Encounter: Payer: Self-pay | Admitting: Family

## 2020-09-11 VITALS — BP 130/80 | HR 61 | Temp 98.3°F | Ht 71.0 in | Wt >= 6400 oz

## 2020-09-11 DIAGNOSIS — I10 Essential (primary) hypertension: Secondary | ICD-10-CM | POA: Diagnosis not present

## 2020-09-11 DIAGNOSIS — M549 Dorsalgia, unspecified: Secondary | ICD-10-CM | POA: Diagnosis not present

## 2020-09-11 DIAGNOSIS — M1A2720 Drug-induced chronic gout, left ankle and foot, without tophus (tophi): Secondary | ICD-10-CM

## 2020-09-11 DIAGNOSIS — G8929 Other chronic pain: Secondary | ICD-10-CM

## 2020-09-11 DIAGNOSIS — I479 Paroxysmal tachycardia, unspecified: Secondary | ICD-10-CM

## 2020-09-11 DIAGNOSIS — T502X5A Adverse effect of carbonic-anhydrase inhibitors, benzothiadiazides and other diuretics, initial encounter: Secondary | ICD-10-CM

## 2020-09-11 DIAGNOSIS — K219 Gastro-esophageal reflux disease without esophagitis: Secondary | ICD-10-CM

## 2020-09-11 DIAGNOSIS — E119 Type 2 diabetes mellitus without complications: Secondary | ICD-10-CM | POA: Diagnosis not present

## 2020-09-11 DIAGNOSIS — E876 Hypokalemia: Secondary | ICD-10-CM

## 2020-09-11 DIAGNOSIS — J302 Other seasonal allergic rhinitis: Secondary | ICD-10-CM

## 2020-09-11 DIAGNOSIS — M7989 Other specified soft tissue disorders: Secondary | ICD-10-CM

## 2020-09-11 DIAGNOSIS — J449 Chronic obstructive pulmonary disease, unspecified: Secondary | ICD-10-CM

## 2020-09-11 MED ORDER — PANTOPRAZOLE SODIUM 40 MG PO TBEC: 40.0000 mg | DELAYED_RELEASE_TABLET | Freq: Every day | ORAL | 3 refills | Status: DC

## 2020-09-11 MED ORDER — VERAPAMIL HCL ER 180 MG PO TBCR: 180.0000 mg | EXTENDED_RELEASE_TABLET | Freq: Two times a day (BID) | ORAL | 0 refills | Status: DC

## 2020-09-11 MED ORDER — PREDNISONE 10 MG PO TABS: ORAL_TABLET | ORAL | 0 refills | Status: DC

## 2020-09-11 MED ORDER — METFORMIN HCL 500 MG PO TABS
ORAL_TABLET | ORAL | 1 refills | Status: DC
Start: 2020-09-11 — End: 2021-04-05

## 2020-09-11 MED ORDER — TORSEMIDE 10 MG PO TABS: 10.0000 mg | ORAL_TABLET | Freq: Two times a day (BID) | ORAL | 1 refills | Status: DC

## 2020-09-11 MED ORDER — ALLOPURINOL 100 MG PO TABS
ORAL_TABLET | ORAL | 0 refills | Status: DC
Start: 2020-09-11 — End: 2020-12-15

## 2020-09-11 MED ORDER — LOSARTAN POTASSIUM 25 MG PO TABS: 25.0000 mg | ORAL_TABLET | Freq: Every day | ORAL | 0 refills | Status: DC

## 2020-09-11 MED ORDER — ALBUTEROL SULFATE HFA 108 (90 BASE) MCG/ACT IN AERS: 2.0000 | INHALATION_SPRAY | Freq: Four times a day (QID) | RESPIRATORY_TRACT | 2 refills | Status: AC | PRN

## 2020-09-11 MED ORDER — POTASSIUM CHLORIDE CRYS ER 20 MEQ PO TBCR
EXTENDED_RELEASE_TABLET | ORAL | 3 refills | Status: DC
Start: 2020-09-11 — End: 2021-10-27

## 2020-09-11 MED ORDER — ROSUVASTATIN CALCIUM 10 MG PO TABS: 10.0000 mg | ORAL_TABLET | Freq: Every day | ORAL | 3 refills | Status: DC

## 2020-09-11 MED ORDER — ASPIRIN EC 81 MG PO TBEC: 81.0000 mg | DELAYED_RELEASE_TABLET | Freq: Every day | ORAL | 3 refills | Status: AC

## 2020-09-11 MED ORDER — GABAPENTIN 100 MG PO CAPS: ORAL_CAPSULE | ORAL | 3 refills | Status: DC

## 2020-09-11 NOTE — Assessment & Plan Note (Signed)
Stable. Continue losartan 25mg.    

## 2020-09-11 NOTE — Assessment & Plan Note (Signed)
DOE on exertion. Worse since COVID. No adventitious lung sounds. Prednisone taper and re-establish with pulmonology

## 2020-09-11 NOTE — Addendum Note (Signed)
Addended by: Fulton Mole D on: 09/11/2020 10:19 AM   Modules accepted: Orders

## 2020-09-11 NOTE — Patient Instructions (Addendum)
Referral to pulmonology Let us know if you dont hear back within a week in regards to an appointment being scheduled.   Start prednisone  Start gabapentin   Labs in 6 weeks

## 2020-09-11 NOTE — Assessment & Plan Note (Signed)
Acute on chronic. He declines Xrays today. Trial of gabapentin. We also discussed cymbalta. Close follow up.

## 2020-09-11 NOTE — Assessment & Plan Note (Signed)
Pending a1c. Continue metformin 1000mg  bid

## 2020-09-11 NOTE — Progress Notes (Signed)
Subjective:    Patient ID: Michael Boyle, male    DOB: 01-25-1966, 55 y.o.   MRN: [id number]  CC: TAY HUWE is a 55 y.o. male who presents today for follow up.   HPI: Complains of mid center back and bilateral lower back pain, over the last year or more, worsened the last couple of months. Standing, sitting no pain. If he lays supine such as sleeping, pain will interfere with sleeping. Pain will on occasion radiate posterior right leg.  No saddle anesthesia, arm or leg weakness or numbness. No falls, trouble urinating or having a bowel movement.  Has taken flexeril without relief.   No h/o GIB. Tried mobic in the past without relief.   SOB has gotten worse over past 5 months SOB with exertion with shorter distances. NO SOB with talking, sitting, occasional with standing may become SOB.  Not sure if cold air is contributory. No known allergies unless during summer time.  COVID positive one month ago Unvaccinated for covid  He has never had exertional chest pain or pressure. denies orthopnea, numbness or tingling radiating to left arm or jawdizziness, frequent headaches, changes in vision.  BLE edema is at baseline. "it is no worse.' Compliant with demadex and potassium.   HTN-compliant with losartan 25mg .   Paroxysmal Tachycardia- compliant with verapail  DM- Compliant with metformin 1000mg  bid Compliant with crestor 10mg .   Compliant with ASA 81mg  Follows with Dr Fletcher Anon for PSVT, CAD ; last seen 04/2020. Deferred cardiac cath. Lexiscan myoview would be limited .   OSA- compliant with Cipap. Machine is old and now using even older version.    Consult with Dr Ander Slade 12/2019. Changed to Trelegy from Advair. NO wheezing, fever, cough. Rare use albuterol . 01/20/20 PFTs  Former smoker   HISTORY:  Past Medical History:  Diagnosis Date  . Colon polyps   . COPD (chronic obstructive pulmonary disease) ([place])   . Diabetes mellitus with complication ([place])   . Genetic  testing 12/07/2017   Common Cancers panel (47 genes) @ [facility] - No pathogenic mutations detected  . GERD (gastroesophageal reflux disease)   . Gout   . Incomplete left bundle branch block   . Incomplete left bundle branch block (LBBB)    a. TTE 4/17: EF of 60-65%, unable to assess wall motion, normal LV diastolic function parameters, mildly calcifief mitral annulus  . Morbid obesity ([place])   . Palpitations    a. felt to be pSVT  . Palpitations   . Venous insufficiency    Past Surgical History:  Procedure Laterality Date  . COLONOSCOPY WITH PROPOFOL N/A 08/14/2015   Procedure: COLONOSCOPY WITH PROPOFOL;  Surgeon: Lollie Sails, MD;  Location[facility] ENDOSCOPY;  Service: Endoscopy;  Laterality: N/A;  . COLONOSCOPY WITH PROPOFOL N/A 11/02/2017   Procedure: COLONOSCOPY WITH PROPOFOL;  Surgeon: Lollie Sails, MD;  Location: [facility] ENDOSCOPY;  Service: Endoscopy;  Laterality: N/A;  . COLONOSCOPY WITH PROPOFOL N/A 02/28/2020   Procedure: COLONOSCOPY WITH PROPOFOL;  Surgeon: Jonathon Bellows, MD;  Location: [facility] ENDOSCOPY;  Service: Gastroenterology;  Laterality: N/A;  . ESOPHAGOGASTRODUODENOSCOPY (EGD) WITH PROPOFOL N/A 08/14/2015   Procedure: ESOPHAGOGASTRODUODENOSCOPY (EGD) WITH PROPOFOL;  Surgeon: Lollie Sails, MD;  Location: [facility] ENDOSCOPY;  Service: Endoscopy;  Laterality: N/A;  . WISDOM TOOTH EXTRACTION     Family History  Problem Relation Age of Onset  . Cancer Sister        lymphoma dx 45s; currently 55  . Diabetes Sister   .  Cirrhosis Sister   . Colon cancer Paternal Uncle        dx 54s; deceased 38  . Prostate cancer Paternal Uncle        dx 57s; currently 99    Allergies: Patient has no known allergies. Current Outpatient Medications on File Prior to Visit  Medication Sig Dispense Refill  . albuterol (VENTOLIN HFA) 108 (90 Base) MCG/ACT inhaler Inhale 2 puffs into the lungs every 6 (six) hours as needed for wheezing or shortness of breath. 1 Inhaler 2  . allopurinol  (ZYLOPRIM) 100 MG tablet Take 1 tablet (100 mg total) by mouth 2 (two) times daily. 180 tablet 0  . aspirin EC 81 MG tablet Take 1 tablet (81 mg total) by mouth daily. 90 tablet 3  . glucose blood test strip Use as instructed 50 each 12  . losartan (COZAAR) 25 MG tablet Take 1 tablet (25 mg total) by mouth daily. 90 tablet 0  . metFORMIN (GLUCOPHAGE) 500 MG tablet Take 2 tablets (1,000 mg total) by mouth 2 (two) times daily with a meal. 180 tablet 0  . pantoprazole (PROTONIX) 40 MG tablet Take 1 tablet (40 mg total) by mouth daily. 90 tablet 3  . rosuvastatin (CRESTOR) 10 MG tablet Take 1 tablet (10 mg total) by mouth daily. 90 tablet 3  . torsemide (DEMADEX) 10 MG tablet Take 2 TABLETS (20 mg total) by mouth 2 (two) times daily. 360 tablet 0  . TRELEGY ELLIPTA 200-62.5-25 MCG/INH AEPB Inhale 1 puff into the lungs daily. 60 each 0  . verapamil (CALAN-SR) 180 MG CR tablet Take 1 tablet (180 mg total) by mouth 2 (two) times daily. 180 tablet 0  . NEOMYCIN-POLYMYXIN-HYDROCORTISONE (CORTISPORIN) 1 % SOLN OTIC solution Apply 1-2 drops to toe BID after soaking (Patient not taking: Reported on 09/11/2020) 10 mL 1   No current facility-administered medications on file prior to visit.    Social History   Tobacco Use  . Smoking status: Former Smoker    Packs/day: 1.75    Years: 30.00    Pack years: 52.50    Types: Cigarettes    Quit date: 06/13/2012    Years since quitting: 8.2  . Smokeless tobacco: Never Used  Vaping Use  . Vaping Use: Former  Substance Use Topics  . Alcohol use: No    Alcohol/week: 0.0 standard drinks  . Drug use: No    Review of Systems  Constitutional: Negative for chills and fever.  Respiratory: Positive for shortness of breath. Negative for cough, chest tightness and wheezing.   Cardiovascular: Positive for leg swelling (at baseline). Negative for chest pain and palpitations.  Gastrointestinal: Negative for constipation, nausea and vomiting.  Genitourinary: Negative  for decreased urine volume.  Musculoskeletal: Positive for arthralgias and back pain.  Neurological: Negative for dizziness and numbness.      Objective:    BP 130/80   Pulse 61   Temp 98.3 F (36.8 C) (Oral)   Ht 5\' 11"  (1.803 m)   Wt (!) 404 lb 6.4 oz (183.4 kg)   SpO2 96%   BMI 56.40 kg/m  BP Readings from Last 3 Encounters:  09/11/20 130/80  04/30/20 118/72  04/17/20 110/60   Wt Readings from Last 3 Encounters:  09/11/20 (!) 404 lb 6.4 oz (183.4 kg)  08/10/20 (!) 408 lb (185.1 kg)  04/30/20 (!) 408 lb (185.1 kg)    Physical Exam Vitals reviewed.  Constitutional:      Appearance: He is well-developed and well-nourished.  Cardiovascular:  Rate and Rhythm: Regular rhythm.     Heart sounds: Normal heart sounds.  Pulmonary:     Effort: Pulmonary effort is normal. No respiratory distress.     Breath sounds: Normal breath sounds. No wheezing, rhonchi or rales.  Musculoskeletal:     Lumbar back: No swelling, pain, spasms or tenderness. Normal range of motion.     Comments: Full range of motion with flexion, extension, lateral side bends. No pain, numbness, tingling elicited with single leg raise bilaterally. No rash.  Lymphadenopathy:     Head:     Left side of head: No submandibular or preauricular adenopathy.  Skin:    General: Skin is warm and dry.  Neurological:     Mental Status: He is alert.  Psychiatric:        Mood and Affect: Mood and affect normal.        Speech: Speech normal.        Behavior: Behavior normal.        Assessment & Plan:   Problem List Items Addressed This Visit      Cardiovascular and Mediastinum   Benign essential hypertension    Stable. Continue losartan 25mg         Respiratory   COPD (chronic obstructive pulmonary disease) ([place])    DOE on exertion. Worse since COVID. No adventitious lung sounds. Prednisone taper and re-establish with pulmonology      Relevant Medications   predniSONE (DELTASONE) 10 MG tablet      Endocrine   Diabetes mellitus without complication ([place])    Pending a1c. Continue metformin 1000mg  bid      Relevant Orders   Hemoglobin A1c     Other   Chronic back pain - Primary    Acute on chronic. He declines Xrays today. Trial of gabapentin. We also discussed cymbalta. Close follow up.       Relevant Medications   gabapentin (NEURONTIN) 100 MG capsule   predniSONE (DELTASONE) 10 MG tablet   Other Relevant Orders   Comprehensive metabolic panel   Uric acid   Ambulatory referral to Pulmonology   Diuretic-induced hypokalemia   Relevant Medications   potassium chloride SA (KLOR-CON) 20 MEQ tablet       I am having Michael Boyle start on gabapentin and predniSONE. I am also having him maintain his aspirin EC, albuterol, glucose blood, rosuvastatin, pantoprazole, NEOMYCIN-POLYMYXIN-HYDROCORTISONE, allopurinol, verapamil, torsemide, metFORMIN, losartan, Trelegy Ellipta, and potassium chloride SA.   Meds ordered this encounter  Medications  . gabapentin (NEURONTIN) 100 MG capsule    Sig: Take one tablet by mouth at bedtime for 3-5 days, then increase to two tablets by mouth at bedtime.    Dispense:  90 capsule    Refill:  3    Order Specific Question:   Supervising Provider    Answer:   Deborra Medina L [[id number]]  . potassium chloride SA (KLOR-CON) 20 MEQ tablet    Sig: TAKE ONE TABLET PER DAY WEDNESDAY & FRIDAYS. TAKE TWO TABLETS PER DAY THE REST OF DAYS OF THE WEEK.    Dispense:  90 tablet    Refill:  3    Order Specific Question:   Supervising Provider    Answer:   Deborra Medina L [[id number]]  . predniSONE (DELTASONE) 10 MG tablet    Sig: Take 4 tablets ( total 40 mg) by mouth for 2 days; take 3 tablets ( total 30 mg) by mouth for 2 days; take 2 tablets ( total 20 mg) by mouth  for 1 day; take 1 tablet ( total 10 mg) by mouth for 1 day.    Dispense:  17 tablet    Refill:  0    Order Specific Question:   Supervising Provider    Answer:   Crecencio Mc [[id number]]    Return  precautions given.   Risks, benefits, and alternatives of the medications and treatment plan prescribed today were discussed, and patient expressed understanding.   Education regarding symptom management and diagnosis given to patient on AVS.  Continue to follow with Burnard Hawthorne, FNP for routine health maintenance.   Michael Boyle and I agreed with plan.   Michael Paris, FNP

## 2020-09-18 ENCOUNTER — Other Ambulatory Visit: Payer: Self-pay | Admitting: Family

## 2020-10-02 ENCOUNTER — Encounter: Payer: Self-pay | Admitting: Family

## 2020-10-02 ENCOUNTER — Other Ambulatory Visit: Payer: Self-pay

## 2020-10-02 ENCOUNTER — Other Ambulatory Visit (INDEPENDENT_AMBULATORY_CARE_PROVIDER_SITE_OTHER): Payer: BC Managed Care – PPO

## 2020-10-02 DIAGNOSIS — M549 Dorsalgia, unspecified: Secondary | ICD-10-CM

## 2020-10-02 DIAGNOSIS — E119 Type 2 diabetes mellitus without complications: Secondary | ICD-10-CM | POA: Diagnosis not present

## 2020-10-02 DIAGNOSIS — G8929 Other chronic pain: Secondary | ICD-10-CM | POA: Diagnosis not present

## 2020-10-02 LAB — COMPREHENSIVE METABOLIC PANEL
ALT: 34 U/L (ref 0–53)
AST: 39 U/L — ABNORMAL HIGH (ref 0–37)
Albumin: 4.3 g/dL (ref 3.5–5.2)
Alkaline Phosphatase: 65 U/L (ref 39–117)
BUN: 10 mg/dL (ref 6–23)
CO2: 31 mEq/L (ref 19–32)
Calcium: 9.4 mg/dL (ref 8.4–10.5)
Chloride: 97 mEq/L (ref 96–112)
Creatinine, Ser: 0.86 mg/dL (ref 0.40–1.50)
GFR: 97.41 mL/min (ref >60.00)
Glucose, Bld: 185 mg/dL — ABNORMAL HIGH (ref 70–99)
Potassium: 3.9 mEq/L (ref 3.5–5.1)
Sodium: 139 mEq/L (ref 135–145)
Total Bilirubin: 0.5 mg/dL (ref 0.2–1.2)
Total Protein: 7.3 g/dL (ref 6.0–8.3)

## 2020-10-02 LAB — HEMOGLOBIN A1C: Hgb A1c MFr Bld: 8.4 % — ABNORMAL HIGH (ref 4.6–6.5)

## 2020-10-02 LAB — URIC ACID: Uric Acid, Serum: 4.6 mg/dL (ref 4.0–7.8)

## 2020-10-06 ENCOUNTER — Other Ambulatory Visit: Payer: Self-pay

## 2020-10-06 DIAGNOSIS — R748 Abnormal levels of other serum enzymes: Secondary | ICD-10-CM

## 2020-10-13 ENCOUNTER — Ambulatory Visit: Payer: BC Managed Care – PPO | Admitting: Adult Health

## 2020-10-13 ENCOUNTER — Other Ambulatory Visit: Payer: Self-pay

## 2020-10-13 ENCOUNTER — Telehealth: Payer: Self-pay | Admitting: Adult Health

## 2020-10-13 ENCOUNTER — Encounter: Payer: Self-pay | Admitting: Adult Health

## 2020-10-13 VITALS — BP 132/78 | HR 59 | Temp 97.7°F | Ht 71.0 in | Wt >= 6400 oz

## 2020-10-13 DIAGNOSIS — G4733 Obstructive sleep apnea (adult) (pediatric): Secondary | ICD-10-CM

## 2020-10-13 DIAGNOSIS — J449 Chronic obstructive pulmonary disease, unspecified: Secondary | ICD-10-CM | POA: Diagnosis not present

## 2020-10-13 MED ORDER — TRELEGY ELLIPTA 200-62.5-25 MCG/INH IN AEPB: 1.0000 | INHALATION_SPRAY | Freq: Every day | RESPIRATORY_TRACT | 11 refills | Status: DC

## 2020-10-13 NOTE — Patient Instructions (Signed)
Order for New CPAP (CPAP auto 10-20cmH2O)   Wear CPAP all night .  Work on healthy weight loss Do not drive if sleepy .   Continue on TRELEGY 1 puff daily ,rinse after use.  Activity as tolerated.  Continue with Lung Cancer screening program.   Follow up in 3 months and As needed

## 2020-10-13 NOTE — Assessment & Plan Note (Signed)
Healthy weight loss 

## 2020-10-13 NOTE — Assessment & Plan Note (Signed)
COPD-patient has mixed disease.  No significant obstruction on previous PFTs.  Has more moderate restriction.  Patient has decreased symptom burden on Trelegy inhaler.  Suspect weight and deconditioning are contributing to some of his dyspnea.  Seems to be at baseline.  Recent low-dose CT screening program was negative in September 2021.  Have encouraged him on activity as tolerated and healthy weight loss. Continue on current regimen with Trelegy. Seems to be a recovered from COVID-19 from December 2021.  Patient is unvaccinated for COVID-19. Pneumovax is up-to-date from 2019   Plan  Patient Instructions  Order for New CPAP (CPAP auto 10-20cmH2O)   Wear CPAP all night .  Work on healthy weight loss Do not drive if sleepy .   Continue on TRELEGY 1 puff daily ,rinse after use.  Activity as tolerated.  Continue with Lung Cancer screening program.   Follow up in 3 months and As needed

## 2020-10-13 NOTE — Progress Notes (Signed)
@Patient  ID: Michael Boyle, male    DOB: June 06, 1966, 55 y.o.   MRN: [id number]  Chief Complaint  Patient presents with  . Follow-up    Referring provider: Burnard Hawthorne, FNP  HPI: 55 year old male former smoker seen for pulmonary consult Dec 27, 2019 to establish for COPD and obstructive sleep apnea  TEST/EVENTS :  2D echo June 2021 EF 55 to 60%, RVSF is normal  PFTs January 20, 2020 FEV1 66%, ratio 73, FVC 70%, DLCO 59%, no significant bronchodilator response  CT chest low-dose-April 21, 2020 lung RADS 1 as negative.  Mild diffuse bronchial wall thickening with mild emphysema.  10/13/2020 Follow up : COPD and obstructive sleep apnea Patient returns for a follow-up visit.  Patient was last seen May 2021 for a sleep and pulmonary consult.  Patient was to establish for COPD and OSA.  Patient was having increased shortness of breath.  He was set up for a pulmonary function test that showed moderate restrictive disease with an FEV1 at 66%, ratio 73 FVC at 70% and a decreased diffusing capacity at 59%.  Patient was changed from Advair to Trelegy.  Patient says that Trelegy is working much better.  He really likes it a lot.  Feels that it has helped with his breathing.  Overall says that he is doing better.  He did have Covid in December 2021.  For around 2 months after Covid had increased shortness of breath and decreased activity tolerance.  Patient says over the last few weeks is starting to feel better and feels that he is back to baseline.  He has no residual cough.  Patient does get short of breath with heavy activity.  Weight is at 403 pounds.  Patient says he gets short of breath if he tries to go upstairs.  If he has to walk for prolonged period time or tries to carry something heavy.  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  He has mainly administrative work.  Patient denies any increased cough congestion shortness of breath or increased leg swelling.   Patient quit smoking in 2013.  Participates in the lung cancer screening program.  Low-dose CT was in September 2021 that showed no suspicious nodules.  Patient has underlying sleep apnea.  Says he was diagnosed more than 10 years ago has been on CPAP this whole time.  Says he cannot sleep without his CPAP.  He wears it every night gets any usually about 5 to 6 hours of sleep.  Says in December 2021 his CPAP machine broke.  And stopped working.  He had an old machine but says it is not working very well.  He is still wearing it but does not feel as rested.  Wants a new machine.  We have contacted his DME company to order a new machine and get previous sleep study records.  Patient says he cannot go without his CPAP.  He feels more rested when he wears this.  He wears this anytime that he is sleeping.   No Known Allergies  Immunization History  Administered Date(s) Administered  . Influenza,inj,Quad PF,6+ Mos 04/24/2014, 05/11/2015, 07/22/2017, 06/26/2018, 05/22/2019, 04/06/2020  . Pneumococcal Polysaccharide-23 03/26/2018    Past Medical History:  Diagnosis Date  . Colon polyps   . COPD (chronic obstructive pulmonary disease) ([place])   . Diabetes mellitus with complication ([place])   . Genetic testing 12/07/2017   Common Cancers panel (47 genes) @ [facility] - No pathogenic mutations detected  .  GERD (gastroesophageal reflux disease)   . Gout   . Incomplete left bundle branch block   . Incomplete left bundle branch block (LBBB)    a. TTE 4/17: EF of 60-65%, unable to assess wall motion, normal LV diastolic function parameters, mildly calcifief mitral annulus  . Morbid obesity ([place])   . Palpitations    a. felt to be pSVT  . Palpitations   . Venous insufficiency     Tobacco History: Social History   Tobacco Use  Smoking Status Former Smoker  . Packs/day: 1.75  . Years: 30.00  . Pack years: 52.50  . Types: Cigarettes  . Quit date: 06/13/2012  . Years since quitting: 8.3  Smokeless  Tobacco Never Used   Counseling given: Not Answered   Outpatient Medications Prior to Visit  Medication Sig Dispense Refill  . albuterol (VENTOLIN HFA) 108 (90 Base) MCG/ACT inhaler Inhale 2 puffs into the lungs every 6 (six) hours as needed for wheezing or shortness of breath. 1 each 2  . allopurinol (ZYLOPRIM) 100 MG tablet Take 1 tablet (100 mg total) by mouth 2 (two) times daily. 180 tablet 0  . aspirin EC 81 MG tablet Take 1 tablet (81 mg total) by mouth daily. 90 tablet 3  . gabapentin (NEURONTIN) 100 MG capsule Take one tablet by mouth at bedtime for 3-5 days, then increase to two tablets by mouth at bedtime. 90 capsule 3  . glucose blood test strip Use as instructed 50 each 12  . losartan (COZAAR) 25 MG tablet Take 1 tablet (25 mg total) by mouth daily. 90 tablet 0  . metFORMIN (GLUCOPHAGE) 500 MG tablet Take 2 tablets (1,000 mg total) by mouth 2 (two) times daily with a meal. 180 tablet 1  . pantoprazole (PROTONIX) 40 MG tablet Take 1 tablet (40 mg total) by mouth daily. 90 tablet 3  . potassium chloride SA (KLOR-CON) 20 MEQ tablet TAKE ONE TABLET PER DAY WEDNESDAY & FRIDAYS. TAKE TWO TABLETS PER DAY THE REST OF DAYS OF THE WEEK. 90 tablet 3  . rosuvastatin (CRESTOR) 10 MG tablet Take 1 tablet (10 mg total) by mouth daily. 90 tablet 3  . torsemide (DEMADEX) 10 MG tablet Take 1 tablet (10 mg total) by mouth 2 (two) times daily. 360 tablet 1  . verapamil (CALAN-SR) 180 MG CR tablet Take 1 tablet (180 mg total) by mouth 2 (two) times daily. 180 tablet 0  . TRELEGY ELLIPTA 200-62.5-25 MCG/INH AEPB Inhale 1 puff into the lungs daily. 60 each 0  . NEOMYCIN-POLYMYXIN-HYDROCORTISONE (CORTISPORIN) 1 % SOLN OTIC solution Apply 1-2 drops to toe BID after soaking (Patient not taking: Reported on 09/11/2020) 10 mL 1  . predniSONE (DELTASONE) 10 MG tablet Take 4 tablets ( total 40 mg) by mouth for 2 days; take 3 tablets ( total 30 mg) by mouth for 2 days; take 2 tablets ( total 20 mg) by mouth for 1  day; take 1 tablet ( total 10 mg) by mouth for 1 day. 17 tablet 0   No facility-administered medications prior to visit.     Review of Systems:   Constitutional:   No  weight loss, night sweats,  Fevers, chills, + fatigue, or  lassitude.  HEENT:   No headaches,  Difficulty swallowing,  Tooth/dental problems, or  Sore throat,                No sneezing, itching, ear ache, nasal congestion, post nasal drip,   CV:  No chest pain,  Orthopnea,  PND, swelling in lower extremities, anasarca, dizziness, palpitations, syncope.   GI  No heartburn, indigestion, abdominal pain, nausea, vomiting, diarrhea, change in bowel habits, loss of appetite, bloody stools.   Resp:    No excess mucus, no productive cough,  No non-productive cough,  No coughing up of blood.  No change in color of mucus.  No wheezing.  No chest wall deformity  Skin: no rash or lesions.  GU: no dysuria, change in color of urine, no urgency or frequency.  No flank pain, no hematuria   MS:  No joint pain or swelling.  No decreased range of motion.  No back pain.    Physical Exam  BP 132/78 (BP Location: Left Arm, Cuff Size: Large)   Pulse (!) 59   Temp 97.7 F (36.5 C) (Temporal)   Ht 5\' 11"  (1.803 m)   Wt (!) 403 lb 9.6 oz (183.1 kg)   SpO2 97%   BMI 56.29 kg/m   GEN: A/Ox3; pleasant , NAD, BMI 56   HEENT:  [place]/AT,  EACs-clear, TMs-wnl, NOSE-clear, THROAT-clear, no lesions, no postnasal drip or exudate noted.  Class III-IV MP airway  NECK:  Supple w/ fair ROM; no JVD; normal carotid impulses w/o bruits; no thyromegaly or nodules palpated; no lymphadenopathy.    RESP  Clear  P & A; w/o, wheezes/ rales/ or rhonchi. no accessory muscle use, no dullness to percussion  CARD:  RRR, no m/r/g, trace to 1+ peripheral edema, pulses intact, no cyanosis or clubbing.  GI:   Soft & nt; nml bowel sounds; no organomegaly or masses detected.   Musco: Warm bil, no deformities or joint swelling noted.   Neuro: alert, no focal  deficits noted.    Skin: Warm, no lesions or rashes    Lab Results: BMET    Component Value Date/Time   NA 139 10/02/2020 0812   NA 141 12/12/2017 1530   K 3.9 10/02/2020 0812   CL 97 10/02/2020 0812   CO2 31 10/02/2020 0812   GLUCOSE 185 (H) 10/02/2020 0812   BUN 10 10/02/2020 0812   BUN 13 12/12/2017 1530   CREATININE 0.86 10/02/2020 0812   CREATININE 0.93 04/24/2017 1415   CALCIUM 9.4 10/02/2020 0812   GFRNONAA 87 12/12/2017 1530   GFRNONAA 94 04/24/2017 1415   GFRAA 101 12/12/2017 1530   GFRAA 109 04/24/2017 1415    BNP No results found for: BNP  ProBNP No results found for: PROBNP  Imaging: No results found.    No flowsheet data found.  No results found for: NITRICOXIDE      Assessment & Plan:   COPD (chronic obstructive pulmonary disease) ([place]) COPD-patient has mixed disease.  No significant obstruction on previous PFTs.  Has more moderate restriction.  Patient has decreased symptom burden on Trelegy inhaler.  Suspect weight and deconditioning are contributing to some of his dyspnea.  Seems to be at baseline.  Recent low-dose CT screening program was negative in September 2021.  Have encouraged him on activity as tolerated and healthy weight loss. Continue on current regimen with Trelegy. Seems to be a recovered from COVID-19 from December 2021.  Patient is unvaccinated for COVID-19. Pneumovax is up-to-date from 2019   Plan  Patient Instructions  Order for New CPAP (CPAP auto 10-20cmH2O)   Wear CPAP all night .  Work on healthy weight loss Do not drive if sleepy .   Continue on TRELEGY 1 puff daily ,rinse after use.  Activity as tolerated.  Continue with Lung  Cancer screening program.   Follow up in 3 months and As needed       Obstructive apnea Obstructive sleep apnea.  Records have been requested from his DME company.  Patient is very compliant with CPAP.  Order for new machine as his machine is broken.  Order for CPAP AutoSet 10 to 20 cm  H2O.  We will do a CPAP download on return visit.  Patient is encouraged on healthy weight loss.  Patient education on sleep apnea and CPAP usage.  Plan  Patient Instructions  Order for New CPAP (CPAP auto 10-20cmH2O)   Wear CPAP all night .  Work on healthy weight loss Do not drive if sleepy .   Continue on TRELEGY 1 puff daily ,rinse after use.  Activity as tolerated.  Continue with Lung Cancer screening program.   Follow up in 3 months and As needed        Morbid obesity ([place]) Healthy weight loss .      Rexene Edison, NP 10/13/2020

## 2020-10-13 NOTE — Addendum Note (Signed)
Addended by: Claudette Head A on: 10/13/2020 11:24 AM   Modules accepted: Orders

## 2020-10-13 NOTE — Assessment & Plan Note (Addendum)
Obstructive sleep apnea.  Records have been requested from his DME company.  Patient is very compliant with CPAP.  Order for new machine as his machine is broken.  Order for CPAP AutoSet 10 to 20 cm H2O.  We will do a CPAP download on return visit.  Patient is encouraged on healthy weight loss.  Patient education on sleep apnea and CPAP usage.  Plan  Patient Instructions  Order for New CPAP (CPAP auto 10-20cmH2O)   Wear CPAP all night .  Work on healthy weight loss Do not drive if sleepy .   Continue on TRELEGY 1 puff daily ,rinse after use.  Activity as tolerated.  Continue with Lung Cancer screening program.   Follow up in 3 months and As needed        Late add : Sleep study obtained 10/2008 showed no significant OSA, could not find repeat sleep study .   Will need to order Home sleep study to evaluate for sleep apnea and need for ongoing CPAP usage.

## 2020-10-13 NOTE — Telephone Encounter (Signed)
Received sleep study for feeling great. Sleep study did not show OSA. Per TP--order HST. Patient is aware and voiced his understanding,  HST has been ordered. Nothing further needed at this time.

## 2020-10-23 ENCOUNTER — Other Ambulatory Visit (INDEPENDENT_AMBULATORY_CARE_PROVIDER_SITE_OTHER): Payer: BC Managed Care – PPO

## 2020-10-23 ENCOUNTER — Other Ambulatory Visit: Payer: Self-pay

## 2020-10-23 DIAGNOSIS — R748 Abnormal levels of other serum enzymes: Secondary | ICD-10-CM

## 2020-10-23 LAB — COMPREHENSIVE METABOLIC PANEL
ALT: 30 U/L (ref 0–53)
AST: 30 U/L (ref 0–37)
Albumin: 4.1 g/dL (ref 3.5–5.2)
Alkaline Phosphatase: 64 U/L (ref 39–117)
BUN: 10 mg/dL (ref 6–23)
CO2: 30 mEq/L (ref 19–32)
Calcium: 9.2 mg/dL (ref 8.4–10.5)
Chloride: 99 mEq/L (ref 96–112)
Creatinine, Ser: 0.83 mg/dL (ref 0.40–1.50)
GFR: 98.42 mL/min (ref >60.00)
Glucose, Bld: 149 mg/dL — ABNORMAL HIGH (ref 70–99)
Potassium: 4 mEq/L (ref 3.5–5.1)
Sodium: 140 mEq/L (ref 135–145)
Total Bilirubin: 0.6 mg/dL (ref 0.2–1.2)
Total Protein: 6.9 g/dL (ref 6.0–8.3)

## 2020-11-04 ENCOUNTER — Other Ambulatory Visit: Payer: Self-pay | Admitting: Family

## 2020-11-04 ENCOUNTER — Encounter: Payer: Self-pay | Admitting: Family

## 2020-11-04 DIAGNOSIS — M7989 Other specified soft tissue disorders: Secondary | ICD-10-CM

## 2020-11-04 MED ORDER — TORSEMIDE 20 MG PO TABS: 20.0000 mg | ORAL_TABLET | Freq: Two times a day (BID) | ORAL | 3 refills | Status: DC

## 2020-11-10 ENCOUNTER — Other Ambulatory Visit: Payer: Self-pay

## 2020-11-10 ENCOUNTER — Ambulatory Visit: Payer: BC Managed Care – PPO

## 2020-11-10 DIAGNOSIS — G4733 Obstructive sleep apnea (adult) (pediatric): Secondary | ICD-10-CM

## 2020-11-12 ENCOUNTER — Telehealth: Payer: Self-pay | Admitting: Adult Health

## 2020-11-12 DIAGNOSIS — G4733 Obstructive sleep apnea (adult) (pediatric): Secondary | ICD-10-CM

## 2020-11-12 NOTE — Telephone Encounter (Signed)
Home sleep study completed on November 10, 2020 showed severe sleep apnea with an AHI at 47.6/hour and SPO2 low at 77%.  Patient is already been on CPAP.  And his CPAP is broken.  Please send order for new CPAP ASAP Orders are for auto CPAP 10 to 20 cm H2O. Mask of choice and enroll in [place]. Can put in comment section okay for Luna CPAP device if necessary

## 2020-11-12 NOTE — Telephone Encounter (Signed)
Called and spoke with pt and he is aware of sleep study results per TP.  He is aware that the order has been placed for him to get set up with cpap.  Nothing further is needed.

## 2020-12-11 ENCOUNTER — Ambulatory Visit: Payer: BC Managed Care – PPO | Admitting: Family

## 2020-12-11 ENCOUNTER — Telehealth (INDEPENDENT_AMBULATORY_CARE_PROVIDER_SITE_OTHER): Payer: BC Managed Care – PPO | Admitting: Family

## 2020-12-11 ENCOUNTER — Encounter: Payer: Self-pay | Admitting: Family

## 2020-12-11 VITALS — Ht 71.0 in

## 2020-12-11 DIAGNOSIS — E785 Hyperlipidemia, unspecified: Secondary | ICD-10-CM | POA: Diagnosis not present

## 2020-12-11 DIAGNOSIS — G8929 Other chronic pain: Secondary | ICD-10-CM

## 2020-12-11 DIAGNOSIS — E119 Type 2 diabetes mellitus without complications: Secondary | ICD-10-CM

## 2020-12-11 DIAGNOSIS — I10 Essential (primary) hypertension: Secondary | ICD-10-CM | POA: Diagnosis not present

## 2020-12-11 DIAGNOSIS — R609 Edema, unspecified: Secondary | ICD-10-CM

## 2020-12-11 DIAGNOSIS — M549 Dorsalgia, unspecified: Secondary | ICD-10-CM | POA: Diagnosis not present

## 2020-12-11 MED ORDER — DULOXETINE HCL 30 MG PO CPEP: ORAL_CAPSULE | ORAL | 3 refills | Status: DC

## 2020-12-11 NOTE — Assessment & Plan Note (Signed)
Excellent control. Continue crestor 10mg 

## 2020-12-11 NOTE — Assessment & Plan Note (Signed)
Controlled in the past. Continue losartan 25mg 

## 2020-12-11 NOTE — Progress Notes (Signed)
Virtual Visit via Video Note  I connected with@  on 12/11/20 at  1:30 PM EDT by a video enabled telemedicine application and verified that I am speaking with the correct person using two identifiers.  Location patient: home Location provider:work  Persons participating in the virtual visit: patient, provider  I discussed the limitations of evaluation and management by telemedicine and the availability of in person appointments. The patient expressed understanding and agreed to proceed  Interactive audio and video telecommunications were attempted between this provider and patient, however failed, due to patient having technical difficulties or patient did not have access to video capability.  We continued and completed visit with audio only.  Marland Kitchen   HPI: Follow mid upper back pain radiates to right side, waxes and waning, pain started 5+ years. No pain today.  Worse with sitting, laying. Standing most comfortable.   Occasional right posterior pain and numbness in  buttocks, thigh.   Gabapentin has not been helpful.  Flexeril with temporary relief No saddle anesthesia, arm or leg weakness or numbness. No falls, trouble urinating or having a bowel movement.   Pain is not worsened with meal. No epigastric pain, abdominal pain.  Colonoscopy is UTD.   DOE and OSA- seen my Clemetine Marker 10/2020. She continued trelegy. Severe OSA and new cipap ordered which he has started.   DM- not checking blood sugar. Compliant with metformin 1000mg  bid. No personal or family h/o thyroid cancer. Denies trouble swallowing or thyroid mass.    HTN- he hasnt checked bp at home. Compliant with losartan 25mg   BLE- compliant with demadex 20mg  BID and potassium chloride . Leg swelling at baseline.   HLD- compliant with crestor   ROS: See pertinent positives and negatives per HPI.    EXAM:  VITALS per patient if applicable: Ht 5\' 11"  (1.803 m)   BMI 56.29 kg/m  BP Readings from Last 3 Encounters:   10/13/20 132/78  09/11/20 130/80  04/30/20 118/72   Wt Readings from Last 3 Encounters:  10/13/20 (!) 403 lb 9.6 oz (183.1 kg)  09/11/20 (!) 404 lb 6.4 oz (183.4 kg)  08/10/20 (!) 408 lb (185.1 kg)      ASSESSMENT AND PLAN:  Discussed the following assessment and plan:  Problem List Items Addressed This Visit      Cardiovascular and Mediastinum   Benign essential hypertension    Controlled in the past. Continue losartan 25mg       Relevant Orders   Comprehensive metabolic panel     Endocrine   Diabetes mellitus without complication (HCC)    Uncontrolled. Pending updating a1c. If a1c >6.5 , will start ozempic as discussed today. Counseled on black box warning of medullary thyroid cancer. Continue metformin 1000mg  bid.       Relevant Orders   Hemoglobin A1c   Comprehensive metabolic panel   Microalbumin / creatinine urine ratio     Other   Chronic back pain - Primary    Chronic, unchanged. Stop gabapentin. Trial cymbalta. Baseline films ordered. Close follow up.       Relevant Medications   DULoxetine (CYMBALTA) 30 MG capsule   Other Relevant Orders   DG Thoracic Spine W/Swimmers   DG Lumbar Spine Complete   Edema, peripheral    Stable, controlled. Continue demadex 20mg  BID and potassium chloride.       HLD (hyperlipidemia)    Excellent control. Continue crestor 10mg          -we discussed possible serious and likely etiologies, options  for evaluation and workup, limitations of telemedicine visit vs in person visit, treatment, treatment risks and precautions. Pt prefers to treat via telemedicine empirically rather then risking or undertaking an in person visit at this moment.  .   I discussed the assessment and treatment plan with the patient. The patient was provided an opportunity to ask questions and all were answered. The patient agreed with the plan and demonstrated an understanding of the instructions.   The patient was advised to call back or seek an  in-person evaluation if the symptoms worsen or if the condition fails to improve as anticipated.  I have spent 35 minutes with a patient including precharting, exam, reviewing medical records, and discussion plan of care.      Mable Paris, FNP

## 2020-12-11 NOTE — Assessment & Plan Note (Signed)
Chronic, unchanged. Stop gabapentin. Trial cymbalta. Baseline films ordered. Close follow up.

## 2020-12-11 NOTE — Assessment & Plan Note (Signed)
Stable, controlled. Continue demadex 20mg  BID and potassium chloride.

## 2020-12-11 NOTE — Patient Instructions (Signed)
nonfasting labs [facility] after June 1st  Xrays at medical between now and when you go for labs. NO appointment needed  Let me know how cymbalta works for you.

## 2020-12-11 NOTE — Assessment & Plan Note (Signed)
Uncontrolled. Pending updating a1c. If a1c >6.5 , will start ozempic as discussed today. Counseled on black box warning of medullary thyroid cancer. Continue metformin 1000mg  bid.

## 2020-12-15 ENCOUNTER — Other Ambulatory Visit: Payer: Self-pay | Admitting: Family

## 2020-12-15 DIAGNOSIS — M1A2720 Drug-induced chronic gout, left ankle and foot, without tophus (tophi): Secondary | ICD-10-CM

## 2020-12-15 DIAGNOSIS — I479 Paroxysmal tachycardia, unspecified: Secondary | ICD-10-CM

## 2020-12-18 IMAGING — CT CT CHEST LUNG CANCER SCREENING LOW DOSE W/O CM
2 of 5 series · 14 of 40 positions shown, 17 images · non-contrast
Comparison: No priors.

CLINICAL DATA: 54-year-old male former smoker (quit 8 years ago)
with 53 pack-year history of smoking. Lung cancer screening
examination.

EXAM:
CT CHEST WITHOUT CONTRAST LOW-DOSE FOR LUNG CANCER SCREENING
TECHNIQUE: Multidetector CT imaging of the chest was performed following the
standard protocol without IV contrast.

[Series 3: lung 1.00 · axial · 0.81mm/px · z∈[-1250,-963]mm · 11 of 319 slices shown, 14 images]
[im 16/319  mediastinal]
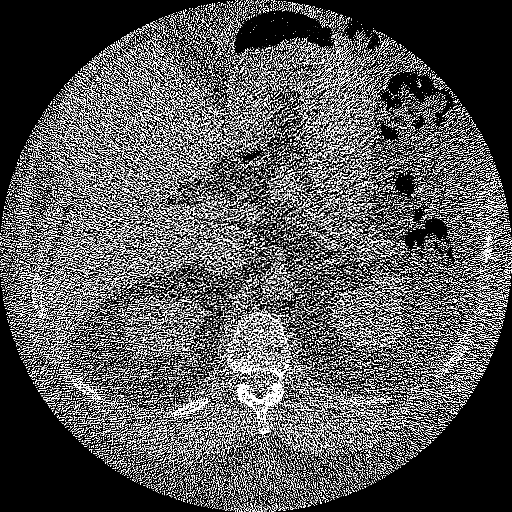
[im 16/319  lung]
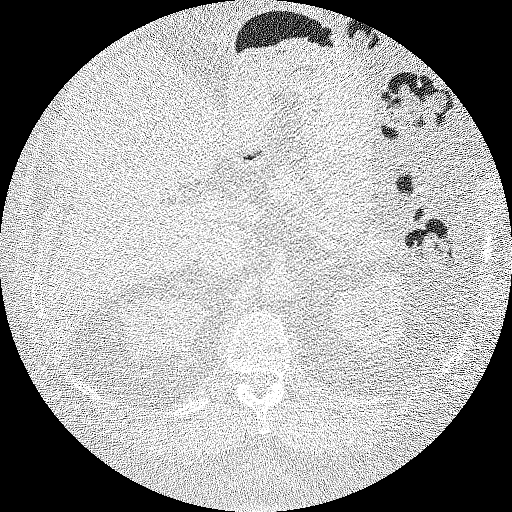
[im 46/319  lung]
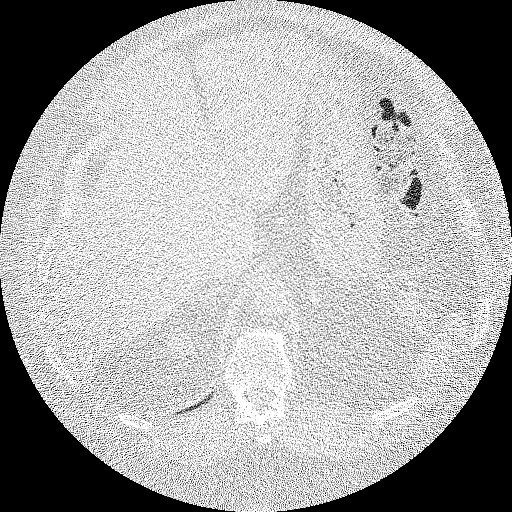
[im 76/319  lung]
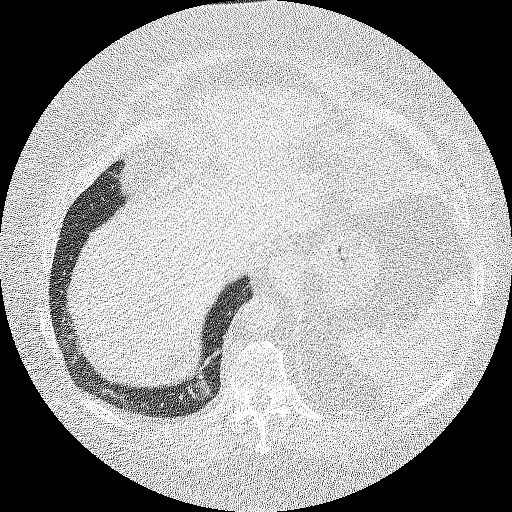
[im 107/319  lung]
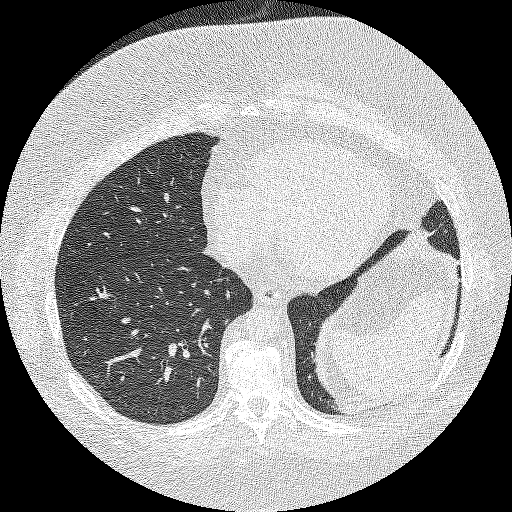
[im 137/319  mediastinal]
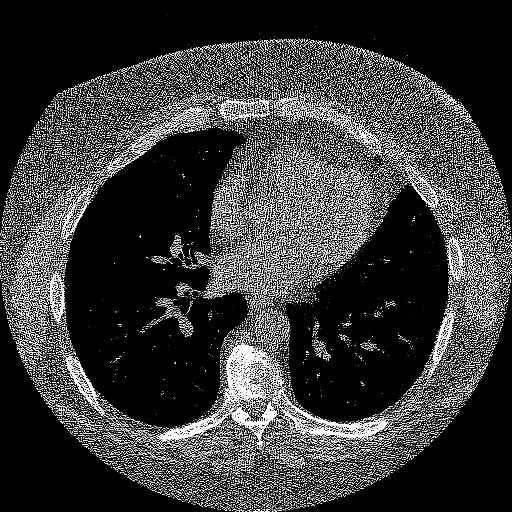
[im 137/319  lung]
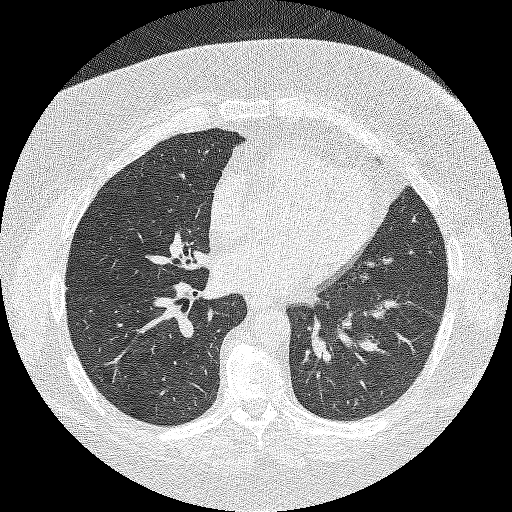
[im 167/319  lung]
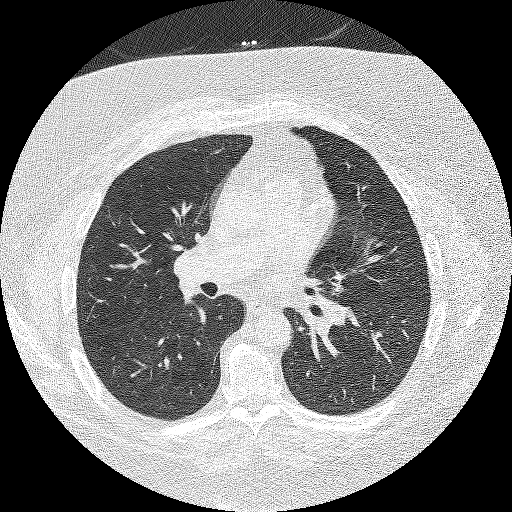
[im 182/319  lung]
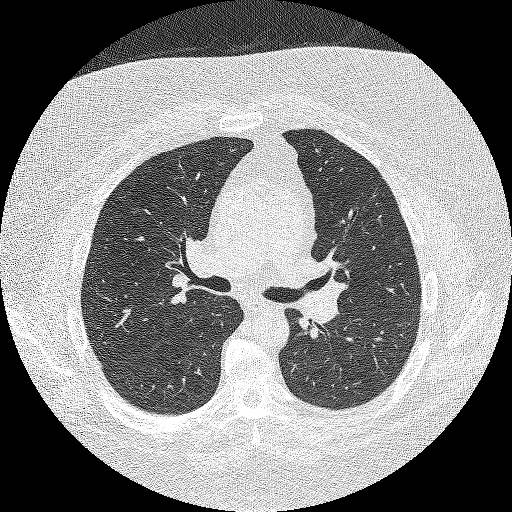
[im 213/319  lung]
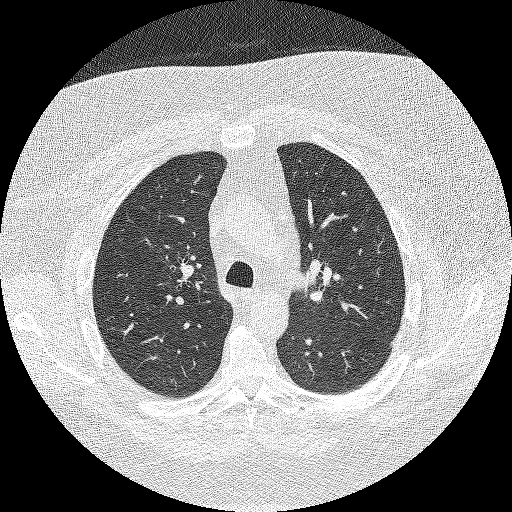
[im 243/319  mediastinal]
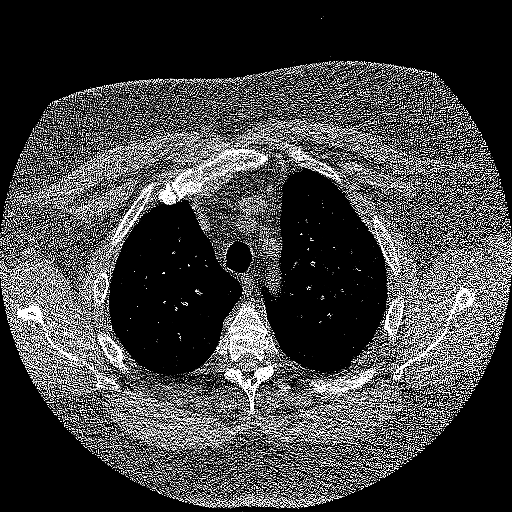
[im 243/319  lung]
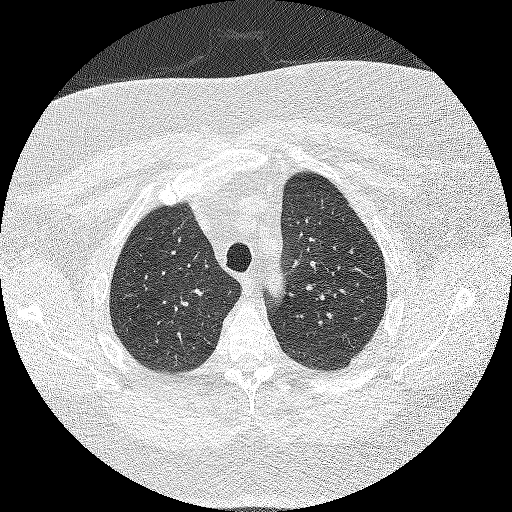
[im 273/319  lung]
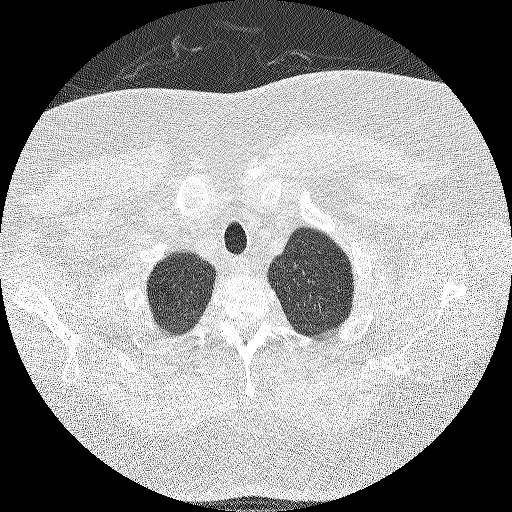
[im 303/319  lung]
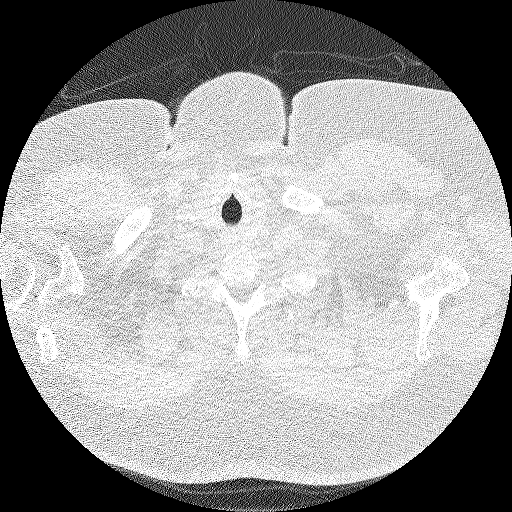

[Series 4: coronals lung 1.00 cor · coronal · 0.62mm/px · 3 of 386 slices shown]
[im 78/386  lung]
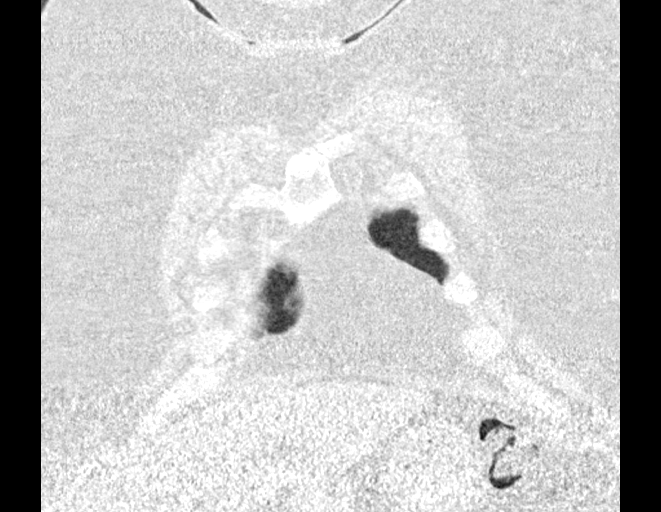
[im 155/386  lung]
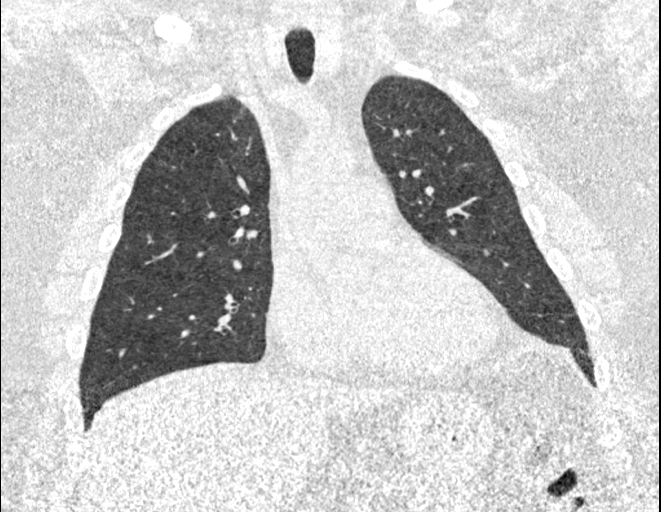
[im 232/386  lung]
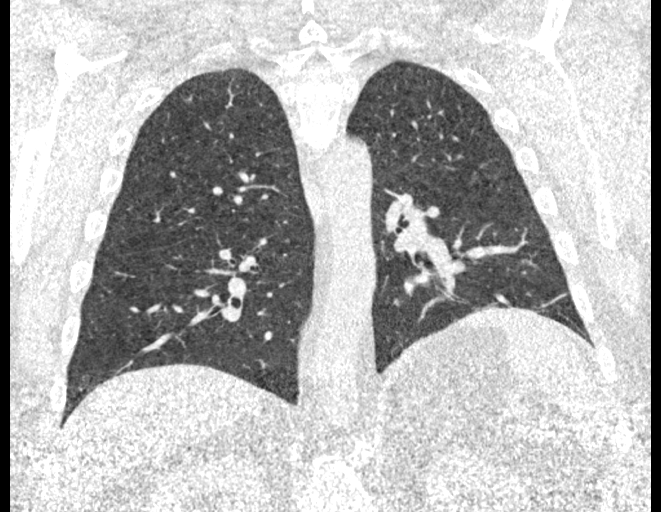

[14 of 40 positions shown; findings below may reference images not displayed]

FINDINGS: Cardiovascular: Heart size is normal. There is no significant
pericardial fluid, thickening or pericardial calcification. There is
aortic atherosclerosis, as well as atherosclerosis of the great
vessels of the mediastinum and the coronary arteries, including
calcified atherosclerotic plaque in the left anterior descending and
right coronary arteries.

Mediastinum/Nodes: No pathologically enlarged mediastinal or hilar
lymph nodes. Please note that accurate exclusion of hilar adenopathy
is limited on noncontrast CT scans. Esophagus is unremarkable in
appearance. No axillary lymphadenopathy.

Lungs/Pleura: No suspicious appearing pulmonary nodules or masses
are noted. No acute consolidative airspace disease. No pleural
effusions. Mild diffuse bronchial wall thickening with mild
centrilobular and paraseptal emphysema.

Upper Abdomen: Low-attenuation lesions in the upper pole of the
right kidney, incompletely characterized on today's non-contrast CT
examination, but statistically likely to represent cysts.

Musculoskeletal: There are no aggressive appearing lytic or blastic
lesions noted in the visualized portions of the skeleton.
IMPRESSION: 1. Lung-RADS 1S, negative. Continue annual screening with low-dose
chest CT without contrast in 12 months.
2. The "S" modifier above refers to potentially clinically
significant non lung cancer related findings. Specifically, there is
aortic atherosclerosis, in addition to 2 vessel coronary artery
disease. Please note that although the presence of coronary artery
calcium documents the presence of coronary artery disease, the
severity of this disease and any potential stenosis cannot be
assessed on this non-gated CT examination. Assessment for potential
risk factor modification, dietary therapy or pharmacologic therapy
may be warranted, if clinically indicated.
3. Mild diffuse bronchial wall thickening with mild centrilobular
and paraseptal emphysema; imaging findings suggestive of underlying
COPD.

Aortic Atherosclerosis (XRVAV-NLS.S) and Emphysema (XRVAV-7YX.M).

## 2021-01-18 ENCOUNTER — Other Ambulatory Visit: Payer: Self-pay

## 2021-01-19 ENCOUNTER — Ambulatory Visit: Payer: BC Managed Care – PPO | Admitting: Adult Health

## 2021-01-19 ENCOUNTER — Encounter: Payer: Self-pay | Admitting: Adult Health

## 2021-01-19 DIAGNOSIS — G4733 Obstructive sleep apnea (adult) (pediatric): Secondary | ICD-10-CM | POA: Diagnosis not present

## 2021-01-19 DIAGNOSIS — J449 Chronic obstructive pulmonary disease, unspecified: Secondary | ICD-10-CM

## 2021-01-19 NOTE — Patient Instructions (Signed)
Continue on CPAP at bedtime Wear CPAP all night .  Work on healthy weight loss Do not drive if sleepy .   Continue on TRELEGY 1 puff daily ,rinse after use.  Activity as tolerated.  Continue with Lung Cancer screening program.   Follow up in 1 months and As needed  (can be video visit ) .

## 2021-01-19 NOTE — Progress Notes (Signed)
@Patient  ID: Michael Boyle, male    DOB: [birth year], 55 y.o.   MRN: [id number]  Chief Complaint  Patient presents with   Follow-up    Referring provider: Burnard Hawthorne, FNP  HPI: 55 year old male former smoker seen for pulmonary consult Dec 27, 2019 to establish for COPD and obstructive sleep apnea Medical history is significant for hypertension, diabetes, hyperlipidemia Covid 19 infection 07/2020 -mild case (Declines Vaccine )   TEST/EVENTS :  Home sleep study completed on November 10, 2020 showed severe sleep apnea with an AHI at 47.6/hour and SPO2 low at 77%.  2D echo June 2021 EF 55 to 60%, RVSF is normal   PFTs January 20, 2020 FEV1 66%, ratio 73, FVC 70%, DLCO 59%, no significant bronchodilator response   CT chest low-dose-April 21, 2020 lung RADS 1 as negative.  Mild diffuse bronchial wall thickening with mild emphysema.  01/19/2021 Follow up: COPD and OSA  Patient presents for 31-month follow-up.  Patient is followed for COPD.  Previous pulmonary function testing last year showed moderate restriction with no airflow obstruction.  Patient is on Trelegy inhaler daily.  He feels that this works well for him.  He denies any flare of cough or wheezing.  He does participate in the lung cancer screening program.  He gets short of breath with activities.  Especially if he has to do any type of incline carry something or go up stairs.  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 Most recent CT chest with September 2021 with no suspicious nodules.  Patient has underlying obstructive sleep apnea.  Has been on CPAP for greater than 10 years.  He says he cannot sleep without his CPAP.  He recently got a new CPAP machine because his machine was getting very old.  He does not have a way to get a CPAP download with his machine.  Patient required a home sleep study in order to get a new CPAP machine.  This was completed on November 10, 2020.   This showed severe sleep apnea with AHI at 47.6/hour with an SPO2 low at 77%.  Patient says he benefits from CPAP and feels that he cannot sleep without it.  It definitely helps with his daytime sleepiness.  Patient just recently got his new CPAP machine.  He says it is starting to work well but is only been on it for the last 2 days.  Patient is on auto CPAP 10 to 20 cm H2O.  The last 2 nights on CPAP download show 10 hours of usage with AHI at 3.8. Patient education on sleep apnea and CPAP usage.  We discussed healthy weight loss.  Current weight is 413 pounds with a BMI of 57.  Helpful tips for healthy weight loss were discussed.     No Known Allergies  Immunization History  Administered Date(s) Administered   Influenza,inj,Quad PF,6+ Mos 04/24/2014, 05/11/2015, 07/22/2017, 06/26/2018, 05/22/2019, 04/06/2020   Pneumococcal Polysaccharide-23 03/26/2018    Past Medical History:  Diagnosis Date   Colon polyps    COPD (chronic obstructive pulmonary disease) ([place])    Diabetes mellitus with complication ([place])    Genetic testing 12/07/2017   Common Cancers panel (47 genes) @ [facility] - No pathogenic mutations detected   GERD (gastroesophageal reflux disease)    Gout    Incomplete left bundle branch block    Incomplete left bundle branch block (LBBB)    a. TTE 4/17: EF of 60-65%,  unable to assess wall motion, normal LV diastolic function parameters, mildly calcifief mitral annulus   Morbid obesity ([facility])    Palpitations    a. felt to be pSVT   Palpitations    Venous insufficiency     Tobacco History: Social History   Tobacco Use  Smoking Status Former   Packs/day: 1.75   Years: 30.00   Pack years: 52.50   Types: Cigarettes   Quit date: 06/13/2012   Years since quitting: 8.6  Smokeless Tobacco Never   Counseling given: Not Answered   Outpatient Medications Prior to Visit  Medication Sig Dispense Refill   albuterol (VENTOLIN HFA) 108 (90 Base) MCG/ACT inhaler Inhale 2  puffs into the lungs every 6 (six) hours as needed for wheezing or shortness of breath. 1 each 2   allopurinol (ZYLOPRIM) 100 MG tablet Take 1 tablet (100 mg total) by mouth 2 (two) times daily. 180 tablet 0   aspirin EC 81 MG tablet Take 1 tablet (81 mg total) by mouth daily. 90 tablet 3   DULoxetine (CYMBALTA) 30 MG capsule Take one 30 mg tablet by mouth once a day for the first week. Then increase to two 30 mg tablets ( total 60mg ) by mouth once daily. 60 capsule 3   Fluticasone-Umeclidin-Vilant (TRELEGY ELLIPTA) 200-62.5-25 MCG/INH AEPB Inhale 1 puff into the lungs daily. 60 each 11   glucose blood test strip Use as instructed 50 each 12   losartan (COZAAR) 25 MG tablet Take 1 tablet (25 mg total) by mouth daily. 90 tablet 0   metFORMIN (GLUCOPHAGE) 500 MG tablet Take 2 tablets (1,000 mg total) by mouth 2 (two) times daily with a meal. 180 tablet 1   pantoprazole (PROTONIX) 40 MG tablet Take 1 tablet (40 mg total) by mouth daily. 90 tablet 3   potassium chloride SA (KLOR-CON) 20 MEQ tablet TAKE ONE TABLET PER DAY WEDNESDAY & FRIDAYS. TAKE TWO TABLETS PER DAY THE REST OF DAYS OF THE WEEK. 90 tablet 3   rosuvastatin (CRESTOR) 10 MG tablet Take 1 tablet (10 mg total) by mouth daily. 90 tablet 3   torsemide (DEMADEX) 20 MG tablet Take 1 tablet (20 mg total) by mouth 2 (two) times daily. 90 tablet 3   verapamil (CALAN-SR) 180 MG CR tablet Take 1 tablet (180 mg total) by mouth 2 (two) times daily. 180 tablet 0   NEOMYCIN-POLYMYXIN-HYDROCORTISONE (CORTISPORIN) 1 % SOLN OTIC solution Apply 1-2 drops to toe BID after soaking (Patient not taking: Reported on 09/11/2020) 10 mL 1   No facility-administered medications prior to visit.     Review of Systems:   Constitutional:   No  weight loss, night sweats,  Fevers, chills, +fatigue, or  lassitude.  HEENT:   No headaches,  Difficulty swallowing,  Tooth/dental problems, or  Sore throat,                No sneezing, itching, ear ache, nasal congestion,  post nasal drip,   CV:  No chest pain,  Orthopnea, PND, +swelling in lower extremities, no anasarca, dizziness, palpitations, syncope.   GI  No heartburn, indigestion, abdominal pain, nausea, vomiting, diarrhea, change in bowel habits, loss of appetite, bloody stools.   Resp: ,  No coughing up of blood.  No change in color of mucus.  No wheezing.  No chest wall deformity  Skin: no rash or lesions.  GU: no dysuria, change in color of urine, no urgency or frequency.  No flank pain, no hematuria   MS:  No joint pain or swelling.  No decreased range of motion.  No back pain.    Physical Exam  BP 136/80 (BP Location: Left Arm, Cuff Size: Large)   Pulse 83   Temp (!) 97.1 F (36.2 C) (Temporal)   Ht 5\' 11"  (1.803 m)   Wt (!) 413 lb 3.2 oz (187.4 kg)   SpO2 96%   BMI 57.63 kg/m   GEN: A/Ox3; pleasant , NAD, BMI 57    HEENT:  /AT,   NOSE-clear, THROAT-clear, no lesions, no postnasal drip or exudate noted.  Class IV MP airway   NECK:  Supple w/ fair ROM; no JVD; normal carotid impulses w/o bruits; no thyromegaly or nodules palpated; no lymphadenopathy.    RESP  Clear  P & A; w/o, wheezes/ rales/ or rhonchi. no accessory muscle use, no dullness to percussion  CARD:  RRR, no m/r/g, tr peripheral edema, pulses intact, no cyanosis or clubbing.  GI:   Soft & nt; nml bowel sounds; no organomegaly or masses detected.   Musco: Warm bil, no deformities or joint swelling noted.   Neuro: alert, no focal deficits noted.    Skin: Warm, no lesions or rashes    Lab Results:   BNP No results found for: BNP  ProBNP No results found for: PROBNP  Imaging: No results found.    No flowsheet data found.  No results found for: NITRICOXIDE      Assessment & Plan:   COPD (chronic obstructive pulmonary disease) ([place]) Compensated on present regimen. Continue with yearly low-dose CT screening program  Plan  Patient Instructions  Continue on CPAP at bedtime Wear CPAP all  night .  Work on healthy weight loss Do not drive if sleepy .   Continue on TRELEGY 1 puff daily ,rinse after use.  Activity as tolerated.  Continue with Lung Cancer screening program.   Follow up in 1 months and As needed  (can be video visit ) .      Obstructive apnea Severe obstructive sleep apnea excellent control on CPAP.  Patient will follow-up in 1 month for compliance check for new CPAP  Morbid obesity ([place]) Healthy weight loss discussed    I spent  30  minutes dedicated to the care of this patient on the date of this encounter to include pre-visit review of records, face-to-face time with the patient discussing conditions above, post visit ordering of testing, clinical documentation with the electronic health record, making appropriate referrals as documented, and communicating necessary findings to members of the patients care team.   Rexene Edison, NP 01/19/2021

## 2021-01-19 NOTE — Assessment & Plan Note (Signed)
Severe obstructive sleep apnea excellent control on CPAP.  Patient will follow-up in 1 month for compliance check for new CPAP

## 2021-01-19 NOTE — Assessment & Plan Note (Signed)
Healthy weight loss discussed 

## 2021-01-19 NOTE — Assessment & Plan Note (Signed)
Compensated on present regimen. Continue with yearly low-dose CT screening program  Plan  Patient Instructions  Continue on CPAP at bedtime Wear CPAP all night .  Work on healthy weight loss Do not drive if sleepy .   Continue on TRELEGY 1 puff daily ,rinse after use.  Activity as tolerated.  Continue with Lung Cancer screening program.   Follow up in 1 months and As needed  (can be video visit ) .

## 2021-01-21 NOTE — Progress Notes (Signed)
Recommend follow-up with sleep provider, [place] office.  Agree with the details of the visit as noted by Rexene Edison, NP.  Loletha Grayer Derrill Kay, MD [facility]

## 2021-02-26 ENCOUNTER — Telehealth (INDEPENDENT_AMBULATORY_CARE_PROVIDER_SITE_OTHER): Payer: BC Managed Care – PPO | Admitting: Adult Health

## 2021-02-26 ENCOUNTER — Encounter: Payer: Self-pay | Admitting: Adult Health

## 2021-02-26 DIAGNOSIS — G4733 Obstructive sleep apnea (adult) (pediatric): Secondary | ICD-10-CM | POA: Diagnosis not present

## 2021-02-26 DIAGNOSIS — J449 Chronic obstructive pulmonary disease, unspecified: Secondary | ICD-10-CM

## 2021-02-26 NOTE — Patient Instructions (Addendum)
Continue on CPAP At bedtime   Keep up good work.  .  Work on healthy weight loss Do not drive if sleepy .   Continue on TRELEGY 1 puff daily ,rinse after use.  Activity as tolerated.  Continue with Lung Cancer screening program.   Follow up in 6 months with Dr. Mortimer Fries or Raquan Iannone NP  and As needed

## 2021-02-26 NOTE — Progress Notes (Signed)
Virtual Visit via Video Note  I connected with Michael Boyle on 02/26/21 at  9:30 AM EDT by a video enabled telemedicine application and verified that I am speaking with the correct person using two identifiers.  Location: Patient: Home   Provider: Office    I discussed the limitations of evaluation and management by telemedicine and the availability of in person appointments. The patient expressed understanding and agreed to proceed.  History of Present Illness: 55 year old male former smoker seen for pulmonary consult Dec 27, 2019 to establish for COPD and obstructive sleep apnea Medical history significant for hypertension, diabetes, hyperlipidemia COVID-19 infection in December 2021 with mild case.  He declines COVID-vaccine  Today's video visit is for a 1 month follow-up for sleep apnea and COPD. Patient says overall breathing is doing okay.  He remains on Trelegy daily.  Patient has had some cold-like symptoms for the last week.  Says that symptoms are improving.  Feels okay.  He has no hemoptysis chest pain or fever.  Has minimum cough with clear mucus.  Has been taken Mucinex which has been helping.  He denies any increased albuterol use.  Patient has underlying obstructive sleep apnea.  Has recently got a new CPAP machine.  Patient says his CPAP machine is working very well.  He loves the new machine.  Says he cannot go without it.  Patient wears his CPAP every single night.  CPAP download shows excellent compliance with 100% usage.  Daily average usage at 9 hours.  Patient is on auto CPAP 10 to 20 cm H2O.  AHI 1.9.  Past Medical History:  Diagnosis Date   Colon polyps    COPD (chronic obstructive pulmonary disease) ([place])    Diabetes mellitus with complication ([facility])    Genetic testing 12/07/2017   Common Cancers panel (47 genes) @ [facility] - No pathogenic mutations detected   GERD (gastroesophageal reflux disease)    Gout    Incomplete left bundle branch block    Incomplete  left bundle branch block (LBBB)    a. TTE 4/17: EF of 60-65%, unable to assess wall motion, normal LV diastolic function parameters, mildly calcifief mitral annulus   Morbid obesity ([facility])    Palpitations    a. felt to be pSVT   Palpitations    Venous insufficiency     Current Outpatient Medications on File Prior to Visit  Medication Sig Dispense Refill   albuterol (VENTOLIN HFA) 108 (90 Base) MCG/ACT inhaler Inhale 2 puffs into the lungs every 6 (six) hours as needed for wheezing or shortness of breath. 1 each 2   allopurinol (ZYLOPRIM) 100 MG tablet Take 1 tablet (100 mg total) by mouth 2 (two) times daily. 180 tablet 0   aspirin EC 81 MG tablet Take 1 tablet (81 mg total) by mouth daily. 90 tablet 3   DULoxetine (CYMBALTA) 30 MG capsule Take one 30 mg tablet by mouth once a day for the first week. Then increase to two 30 mg tablets ( total '60mg'$ ) by mouth once daily. 60 capsule 3   Fluticasone-Umeclidin-Vilant (TRELEGY ELLIPTA) 200-62.5-25 MCG/INH AEPB Inhale 1 puff into the lungs daily. 60 each 11   glucose blood test strip Use as instructed 50 each 12   losartan (COZAAR) 25 MG tablet Take 1 tablet (25 mg total) by mouth daily. 90 tablet 0   metFORMIN (GLUCOPHAGE) 500 MG tablet Take 2 tablets (1,000 mg total) by mouth 2 (two) times daily with a meal. 180 tablet 1  pantoprazole (PROTONIX) 40 MG tablet Take 1 tablet (40 mg total) by mouth daily. 90 tablet 3   potassium chloride SA (KLOR-CON) 20 MEQ tablet TAKE ONE TABLET PER DAY WEDNESDAY & FRIDAYS. TAKE TWO TABLETS PER DAY THE REST OF DAYS OF THE WEEK. 90 tablet 3   rosuvastatin (CRESTOR) 10 MG tablet Take 1 tablet (10 mg total) by mouth daily. 90 tablet 3   torsemide (DEMADEX) 20 MG tablet Take 1 tablet (20 mg total) by mouth 2 (two) times daily. 90 tablet 3   verapamil (CALAN-SR) 180 MG CR tablet Take 1 tablet (180 mg total) by mouth 2 (two) times daily. 180 tablet 0   No current facility-administered medications on file prior to visit.       Observations/Objective: Patient appears well with no acute distress.  Home sleep study completed on November 10, 2020 showed severe sleep apnea with an AHI at 47.6/hour and SPO2 low at 77%.   2D echo June 2021 EF 55 to 60%, RVSF is normal   PFTs January 20, 2020 FEV1 66%, ratio 73, FVC 70%, DLCO 59%, no significant bronchodilator response   CT chest low-dose-April 21, 2020 lung RADS 1 as negative.  Mild diffuse bronchial wall thickening with mild emphysema.  Assessment and Plan: COPD -controlled on current regimen.  Continue on Trelegy. Continue with low-dose CT screening program  Obstructive sleep apnea with excellent control and compliance on nocturnal CPAP.  No changes  Plan  Patient Instructions  Continue on CPAP At bedtime   Keep up good work.  .  Work on healthy weight loss Do not drive if sleepy .   Continue on TRELEGY 1 puff daily ,rinse after use.  Activity as tolerated.  Continue with Lung Cancer screening program.   Follow up in 6 months with Dr. Mortimer Fries or Camala Talwar NP  and As needed      Follow Up Instructions:    I discussed the assessment and treatment plan with the patient. The patient was provided an opportunity to ask questions and all were answered. The patient agreed with the plan and demonstrated an understanding of the instructions.   The patient was advised to call back or seek an in-person evaluation if the symptoms worsen or if the condition fails to improve as anticipated.  I provided 22  minutes of non-face-to-face time during this encounter.   Rexene Edison, NP

## 2021-03-15 ENCOUNTER — Other Ambulatory Visit: Payer: Self-pay

## 2021-03-15 DIAGNOSIS — E119 Type 2 diabetes mellitus without complications: Secondary | ICD-10-CM

## 2021-03-16 ENCOUNTER — Other Ambulatory Visit: Payer: Self-pay | Admitting: Family

## 2021-03-16 DIAGNOSIS — M1A2720 Drug-induced chronic gout, left ankle and foot, without tophus (tophi): Secondary | ICD-10-CM

## 2021-03-16 DIAGNOSIS — I479 Paroxysmal tachycardia, unspecified: Secondary | ICD-10-CM

## 2021-03-19 ENCOUNTER — Other Ambulatory Visit (INDEPENDENT_AMBULATORY_CARE_PROVIDER_SITE_OTHER): Payer: BC Managed Care – PPO

## 2021-03-19 ENCOUNTER — Other Ambulatory Visit: Payer: Self-pay

## 2021-03-19 DIAGNOSIS — E119 Type 2 diabetes mellitus without complications: Secondary | ICD-10-CM

## 2021-03-19 DIAGNOSIS — I479 Paroxysmal tachycardia, unspecified: Secondary | ICD-10-CM

## 2021-03-19 DIAGNOSIS — M1A2720 Drug-induced chronic gout, left ankle and foot, without tophus (tophi): Secondary | ICD-10-CM

## 2021-03-19 LAB — COMPREHENSIVE METABOLIC PANEL
ALT: 21 U/L (ref 0–53)
AST: 20 U/L (ref 0–37)
Albumin: 3.9 g/dL (ref 3.5–5.2)
Alkaline Phosphatase: 61 U/L (ref 39–117)
BUN: 10 mg/dL (ref 6–23)
CO2: 28 mEq/L (ref 19–32)
Calcium: 8.9 mg/dL (ref 8.4–10.5)
Chloride: 100 mEq/L (ref 96–112)
Creatinine, Ser: 0.77 mg/dL (ref 0.40–1.50)
GFR: 100.39 mL/min (ref >60.00)
Glucose, Bld: 169 mg/dL — ABNORMAL HIGH (ref 70–99)
Potassium: 3.9 mEq/L (ref 3.5–5.1)
Sodium: 138 mEq/L (ref 135–145)
Total Bilirubin: 0.6 mg/dL (ref 0.2–1.2)
Total Protein: 6.9 g/dL (ref 6.0–8.3)

## 2021-03-19 LAB — HEMOGLOBIN A1C: Hgb A1c MFr Bld: 9.2 % — ABNORMAL HIGH (ref 4.6–6.5)

## 2021-03-19 MED ORDER — ALLOPURINOL 100 MG PO TABS: 100.0000 mg | ORAL_TABLET | Freq: Two times a day (BID) | ORAL | 0 refills | Status: DC

## 2021-03-19 MED ORDER — VERAPAMIL HCL ER 180 MG PO TBCR: 180.0000 mg | EXTENDED_RELEASE_TABLET | Freq: Two times a day (BID) | ORAL | 0 refills | Status: DC

## 2021-03-19 NOTE — Addendum Note (Signed)
Addended by: Leeanne Rio on: 03/19/2021 09:42 AM   Modules accepted: Orders

## 2021-03-26 ENCOUNTER — Ambulatory Visit: Payer: BC Managed Care – PPO | Admitting: Family

## 2021-03-26 ENCOUNTER — Other Ambulatory Visit: Payer: BC Managed Care – PPO

## 2021-03-26 ENCOUNTER — Other Ambulatory Visit: Payer: Self-pay

## 2021-03-26 DIAGNOSIS — E119 Type 2 diabetes mellitus without complications: Secondary | ICD-10-CM | POA: Diagnosis not present

## 2021-03-26 LAB — MICROALBUMIN / CREATININE URINE RATIO
Creatinine,U: 96.3 mg/dL
Microalb Creat Ratio: 0.7 mg/g (ref 0.0–30.0)
Microalb, Ur: <0.7 mg/dL (ref 0.0–1.9)

## 2021-04-05 ENCOUNTER — Other Ambulatory Visit: Payer: Self-pay

## 2021-04-05 ENCOUNTER — Ambulatory Visit: Payer: BC Managed Care – PPO | Admitting: Family

## 2021-04-05 ENCOUNTER — Other Ambulatory Visit: Payer: Self-pay | Admitting: Family

## 2021-04-05 ENCOUNTER — Encounter: Payer: Self-pay | Admitting: Family

## 2021-04-05 VITALS — BP 128/82 | HR 64 | Temp 98.7°F | Ht 70.98 in | Wt >= 6400 oz

## 2021-04-05 DIAGNOSIS — M549 Dorsalgia, unspecified: Secondary | ICD-10-CM | POA: Diagnosis not present

## 2021-04-05 DIAGNOSIS — E119 Type 2 diabetes mellitus without complications: Secondary | ICD-10-CM | POA: Diagnosis not present

## 2021-04-05 DIAGNOSIS — Z125 Encounter for screening for malignant neoplasm of prostate: Secondary | ICD-10-CM | POA: Diagnosis not present

## 2021-04-05 DIAGNOSIS — R519 Headache, unspecified: Secondary | ICD-10-CM

## 2021-04-05 DIAGNOSIS — R413 Other amnesia: Secondary | ICD-10-CM | POA: Diagnosis not present

## 2021-04-05 DIAGNOSIS — G8929 Other chronic pain: Secondary | ICD-10-CM

## 2021-04-05 DIAGNOSIS — E538 Deficiency of other specified B group vitamins: Secondary | ICD-10-CM

## 2021-04-05 LAB — LIPID PANEL
Cholesterol: 100 mg/dL (ref 0–200)
HDL: 38.6 mg/dL — ABNORMAL LOW (ref >39.00)
LDL Cholesterol: 45 mg/dL (ref 0–99)
NonHDL: 61.06
Total CHOL/HDL Ratio: 3
Triglycerides: 80 mg/dL (ref 0.0–149.0)
VLDL: 16 mg/dL (ref 0.0–40.0)

## 2021-04-05 LAB — PSA: PSA: 0.59 ng/mL (ref 0.10–4.00)

## 2021-04-05 LAB — B12 AND FOLATE PANEL
Folate: 11.1 ng/mL (ref >5.9)
Vitamin B-12: 232 pg/mL (ref 211–911)

## 2021-04-05 MED ORDER — OZEMPIC (0.25 OR 0.5 MG/DOSE) 2 MG/1.5ML ~~LOC~~ SOPN: 0.2500 mg | PEN_INJECTOR | SUBCUTANEOUS | 3 refills | Status: DC

## 2021-04-05 MED ORDER — METFORMIN HCL ER 500 MG PO TB24: 2000.0000 mg | ORAL_TABLET | Freq: Every evening | ORAL | 3 refills | Status: DC

## 2021-04-05 NOTE — Assessment & Plan Note (Signed)
Lab Results  Component Value Date   HGBA1C 9.2 (H) 03/19/2021   Uncontrolled, he has been missing his second dose of metformin 1000 mg.  I will change metformin to metformin XR so patient may take 2000 mg nightly.  We also agreed to go ahead and start Ozempic to aid in diabetic control and weight loss as it relates to chronic back pain.  Counseled on black box warning regarding MEN, medullary thyroid cancer.

## 2021-04-05 NOTE — Assessment & Plan Note (Addendum)
Resolved. Declines Xrays today. continue Cymbalta.

## 2021-04-05 NOTE — Assessment & Plan Note (Signed)
New.  No headache today.  Normal neurologic exam.  Advised neuroimaging due to age of 27 and new onset headaches.  Patient politely declines MRI Brain as doesn't think that this is necessary.  He would like to first trial decreasing Cymbalta as I advised that the Cymbalta can cause HA as side effect.  He will decrease Cymbalta 30 mg and increase his water intake.  We will also keep a headache diary and call me with any worsening of headache, increased frequency or severity. If headache becomes positional or exertional, he understands to let me know.

## 2021-04-05 NOTE — Assessment & Plan Note (Addendum)
Reassuring neurologic exam.  Discussed neuroimaging and patient politely declines at this time.  Pending B12. Working to regain control of DM.  Agreed to monitor for now with attention at follow-up

## 2021-04-05 NOTE — Progress Notes (Signed)
Subjective:    Patient ID: Michael Boyle, male    DOB: Apr 14, 1966, 55 y.o.   MRN: [id number]  CC: Michael Boyle is a 55 y.o. male who presents today for follow up.   HPI: Reports feeling more 'forgetful x 3 months, unchanged.   He has locked himself out of the house. He hasnt gotten lost or forgotten names. He wasn't sure if related to blood sugar.  Sleep is interrupted by waking up and he isnt sure why. Sleep is restorative.  6.5 hours of sleep is his normal for many years. No nocturia.  No immediate family h/o dementia. Maternal aunt had dementia. No depression.    Complains of HA frontal HA. 'dull' x 4 weeks. Doesn't take any medication for the pain. No vision loss, confused, numbness of face. No caffeine or alcohol use.  Occurs 1-2 per week. HA is not positional, exertional. He doesn't drink a lot water.    Low back pain- Started Cymbalta '60mg'$  at last visit and low back pain has completely resolved.  Declines xrays of back at this time.  No trouble urinating, saddle anesthesia or numbness in legs.    HTN-compliant with losartan 25 mg, verapamil 180 mg. No cp, sob.   DM-FBG 200. compliant with metformin 1000 mg twice daily and reports missing one dose frequently. No blurry vision, polydipsia. He is drinking one soda per day. No personal or family h/o  thyroid cancer, MEN.   Leg swelling-he hasnt been using Demadex 20 mg twice daily, potassium chloride 20 MEQ. No leg swelling today. Usually more noticeable in the evening.  Hyperlipidemia-compliant with Crestor 10 mg Following with pulmonology for OSA.  Compliant with CPAP.  No problem urinating or hesitancy.  HISTORY:  Past Medical History:  Diagnosis Date   Colon polyps    COPD (chronic obstructive pulmonary disease) ([place])    Diabetes mellitus with complication (HCC)    Genetic testing 12/07/2017   Common Cancers panel (47 genes) @ [facility] - No pathogenic mutations detected   GERD (gastroesophageal reflux disease)     Gout    Incomplete left bundle branch block    Incomplete left bundle branch block (LBBB)    a. TTE 4/17: EF of 60-65%, unable to assess wall motion, normal LV diastolic function parameters, mildly calcifief mitral annulus   Morbid obesity (HCC)    Palpitations    a. felt to be pSVT   Palpitations    Venous insufficiency    Past Surgical History:  Procedure Laterality Date   COLONOSCOPY WITH PROPOFOL N/A 08/14/2015   Procedure: COLONOSCOPY WITH PROPOFOL;  Surgeon: Lollie Sails, MD;  Location: [facility] ENDOSCOPY;  Service: Endoscopy;  Laterality: N/A;   COLONOSCOPY WITH PROPOFOL N/A 11/02/2017   Procedure: COLONOSCOPY WITH PROPOFOL;  Surgeon: Lollie Sails, MD;  Location: [facility] ENDOSCOPY;  Service: Endoscopy;  Laterality: N/A;   COLONOSCOPY WITH PROPOFOL N/A 02/28/2020   Procedure: COLONOSCOPY WITH PROPOFOL;  Surgeon: Jonathon Bellows, MD;  Location: [facility] ENDOSCOPY;  Service: Gastroenterology;  Laterality: N/A;   ESOPHAGOGASTRODUODENOSCOPY (EGD) WITH PROPOFOL N/A 08/14/2015   Procedure: ESOPHAGOGASTRODUODENOSCOPY (EGD) WITH PROPOFOL;  Surgeon: Lollie Sails, MD;  Location: [facility] ENDOSCOPY;  Service: Endoscopy;  Laterality: N/A;   WISDOM TOOTH EXTRACTION     Family History  Problem Relation Age of Onset   Cancer Sister        lymphoma dx 15s; currently 12   Diabetes Sister    Cirrhosis Sister    Colon cancer Paternal Uncle  dx 46s; deceased 49   Prostate cancer Paternal Uncle        dx 75s; currently 29   Dementia Maternal Aunt 85   Thyroid cancer Neg Hx     Allergies: Patient has no known allergies. Current Outpatient Medications on File Prior to Visit  Medication Sig Dispense Refill   albuterol (VENTOLIN HFA) 108 (90 Base) MCG/ACT inhaler Inhale 2 puffs into the lungs every 6 (six) hours as needed for wheezing or shortness of breath. 1 each 2   allopurinol (ZYLOPRIM) 100 MG tablet Take 1 tablet (100 mg total) by mouth 2 (two) times daily. 180 tablet 0   aspirin EC 81 MG  tablet Take 1 tablet (81 mg total) by mouth daily. 90 tablet 3   DULoxetine (CYMBALTA) 30 MG capsule Take one 30 mg tablet by mouth once a day for the first week. Then increase to two 30 mg tablets ( total '60mg'$ ) by mouth once daily. 60 capsule 3   Fluticasone-Umeclidin-Vilant (TRELEGY ELLIPTA) 200-62.5-25 MCG/INH AEPB Inhale 1 puff into the lungs daily. 60 each 11   glucose blood test strip Use as instructed 50 each 12   losartan (COZAAR) 25 MG tablet Take 1 tablet (25 mg total) by mouth daily. 90 tablet 0   pantoprazole (PROTONIX) 40 MG tablet Take 1 tablet (40 mg total) by mouth daily. 90 tablet 3   potassium chloride SA (KLOR-CON) 20 MEQ tablet TAKE ONE TABLET PER DAY WEDNESDAY & FRIDAYS. TAKE TWO TABLETS PER DAY THE REST OF DAYS OF THE WEEK. 90 tablet 3   rosuvastatin (CRESTOR) 10 MG tablet Take 1 tablet (10 mg total) by mouth daily. 90 tablet 3   torsemide (DEMADEX) 20 MG tablet Take 1 tablet (20 mg total) by mouth 2 (two) times daily. 90 tablet 3   verapamil (CALAN-SR) 180 MG CR tablet Take 1 tablet (180 mg total) by mouth 2 (two) times daily. 180 tablet 0   No current facility-administered medications on file prior to visit.    Social History   Tobacco Use   Smoking status: Former    Packs/day: 1.75    Years: 30.00    Pack years: 52.50    Types: Cigarettes    Quit date: 06/13/2012    Years since quitting: 8.8   Smokeless tobacco: Never  Vaping Use   Vaping Use: Former  Substance Use Topics   Alcohol use: No    Alcohol/week: 0.0 standard drinks   Drug use: No    Review of Systems  Constitutional:  Negative for chills and fever.  HENT:  Negative for congestion, ear pain, rhinorrhea, sinus pressure and sore throat.   Eyes:  Negative for visual disturbance.  Respiratory:  Negative for cough, shortness of breath and wheezing.   Cardiovascular:  Negative for chest pain and palpitations.  Gastrointestinal:  Negative for diarrhea, nausea and vomiting.  Genitourinary:  Negative  for dysuria.  Musculoskeletal:  Negative for back pain and myalgias.  Skin:  Negative for rash.  Neurological:  Positive for headaches. Negative for numbness.  Hematological:  Negative for adenopathy.     Objective:    BP 128/82   Pulse 64   Temp 98.7 F (37.1 C)   Ht 5' 10.98" (1.803 m)   Wt (!) 409 lb 6.4 oz (185.7 kg)   SpO2 94%   BMI 57.13 kg/m  BP Readings from Last 3 Encounters:  04/05/21 128/82  01/19/21 136/80  10/13/20 132/78   Wt Readings from Last 3 Encounters:  04/05/21 Marland Kitchen)  409 lb 6.4 oz (185.7 kg)  01/19/21 (!) 413 lb 3.2 oz (187.4 kg)  10/13/20 (!) 403 lb 9.6 oz (183.1 kg)    Physical Exam Vitals reviewed.  Constitutional:      Appearance: He is well-developed.  HENT:     Right Ear: Hearing normal.     Left Ear: Hearing normal.     Mouth/Throat:     Pharynx: Uvula midline. No posterior oropharyngeal erythema.  Eyes:     General: Lids are normal. Lids are everted, no foreign bodies appreciated.     Conjunctiva/sclera: Conjunctivae normal.     Pupils: Pupils are equal, round, and reactive to light.     Comments: Normal fundus bilaterally.  Cardiovascular:     Rate and Rhythm: Regular rhythm.     Heart sounds: Normal heart sounds.  Pulmonary:     Effort: Pulmonary effort is normal. No respiratory distress.     Breath sounds: Normal breath sounds. No wheezing, rhonchi or rales.  Musculoskeletal:     Lumbar back: No swelling, spasms or tenderness. Normal range of motion.     Comments: Full range of motion with flexion, extension, lateral side bends. No pain, numbness, tingling elicited with single leg raise bilaterally. No rash.  Lymphadenopathy:     Head:     Right side of head: No submental, submandibular, tonsillar, preauricular, posterior auricular or occipital adenopathy.     Left side of head: No submental, submandibular, tonsillar, preauricular, posterior auricular or occipital adenopathy.     Cervical: No cervical adenopathy.  Skin:     General: Skin is warm and dry.  Neurological:     Mental Status: He is alert.     Cranial Nerves: No cranial nerve deficit.     Sensory: No sensory deficit.     Deep Tendon Reflexes:     Reflex Scores:      Bicep reflexes are 2+ on the right side and 2+ on the left side.      Patellar reflexes are 2+ on the right side and 2+ on the left side.    Comments: Grip equal and strong bilateral upper extremities. Gait strong and steady. Able to perform rapid alternating movement without difficulty.  Psychiatric:        Speech: Speech normal.        Behavior: Behavior normal.       Assessment & Plan:   Problem List Items Addressed This Visit       Endocrine   Diabetes mellitus without complication ([place]) - Primary    Lab Results  Component Value Date   HGBA1C 9.2 (H) 03/19/2021  Uncontrolled, he has been missing his second dose of metformin 1000 mg.  I will change metformin to metformin XR so patient may take 2000 mg nightly.  We also agreed to go ahead and start Ozempic to aid in diabetic control and weight loss as it relates to chronic back pain.  Counseled on black box warning regarding MEN, medullary thyroid cancer.      Relevant Medications   metFORMIN (GLUCOPHAGE XR) 500 MG 24 hr tablet   Semaglutide,0.25 or 0.'5MG'$ /DOS, (OZEMPIC, 0.25 OR 0.5 MG/DOSE,) 2 MG/1.5ML SOPN   Other Relevant Orders   Lipid panel     Other   Chronic back pain    Resolved. Declines Xrays today. continue Cymbalta.      Headache    New.  No headache today.  Normal neurologic exam.  Advised neuroimaging due to age of 18 and new onset  headaches.  Patient politely declines MRI Brain as doesn't think that this is necessary.  He would like to first trial decreasing Cymbalta as I advised that the Cymbalta can cause HA as side effect.  He will decrease Cymbalta 30 mg and increase his water intake.  We will also keep a headache diary and call me with any worsening of headache, increased frequency or severity. If  headache becomes positional or exertional, he understands to let me know.      Memory change    Reassuring neurologic exam.  Discussed neuroimaging and patient politely declines at this time.  Pending B12. Working to regain control of DM.  Agreed to monitor for now with attention at follow-up      Relevant Orders   B12 and Folate Panel   Other Visit Diagnoses     Screening for prostate cancer       Relevant Orders   PSA        I have discontinued Zoey L. Porcaro's metFORMIN. I am also having him start on metFORMIN and Ozempic (0.25 or 0.5 MG/DOSE). Additionally, I am having him maintain his glucose blood, potassium chloride SA, albuterol, aspirin EC, losartan, pantoprazole, rosuvastatin, Trelegy Ellipta, torsemide, DULoxetine, verapamil, and allopurinol.   Meds ordered this encounter  Medications   metFORMIN (GLUCOPHAGE XR) 500 MG 24 hr tablet    Sig: Take 4 tablets (2,000 mg total) by mouth every evening.    Dispense:  120 tablet    Refill:  3    Order Specific Question:   Supervising Provider    Answer:   Deborra Medina L [[id number]]   Semaglutide,0.25 or 0.'5MG'$ /DOS, (OZEMPIC, 0.25 OR 0.5 MG/DOSE,) 2 MG/1.5ML SOPN    Sig: Inject 0.25 mg into the skin once a week. After 4 weeks, increase to 0.'5mg'$  [place] qwk.    Dispense:  3 mL    Refill:  3    Order Specific Question:   Supervising Provider    Answer:   Crecencio Mc [[id number]]    Return precautions given.   Risks, benefits, and alternatives of the medications and treatment plan prescribed today were discussed, and patient expressed understanding.   Education regarding symptom management and diagnosis given to patient on AVS.  Continue to follow with Burnard Hawthorne, FNP for routine health maintenance.   Louann Liv and I agreed with plan.   Mable Paris, FNP

## 2021-04-05 NOTE — Patient Instructions (Addendum)
Goal of fasting blood sugar is between 80-120.   Have annual eye exam  Decrease cymbalta to '30mg'$  to see if headache resolve  Drink more water  Keep headache diary  Start ozempic  Start ozempic 0.'25mg'$  once per week After 4 weeks, and if tolerated, please increase to 0.'5mg'$    Please read information on medication below and remember black box warning that you may not take if you or a family member is diagnosed with thyroid cancer.    Semaglutide injection solution What is this medicine? SEMAGLUTIDE (Sem a GLOO tide) is used to improve blood sugar control in adults with type 2 diabetes. This medicine may be used with other diabetes medicines. This drug may also reduce the risk of heart attack or stroke if you have type 2 diabetes and risk factors for heart disease. This medicine may be used for other purposes; ask your health care provider or pharmacist if you have questions. COMMON BRAND NAME(S): OZEMPIC What should I tell my health care provider before I take this medicine? They need to know if you have any of these conditions: endocrine tumors (MEN 2) or if someone in your family had these tumors eye disease, vision problems history of pancreatitis kidney disease stomach problems thyroid cancer or if someone in your family had thyroid cancer an unusual or allergic reaction to semaglutide, other medicines, foods, dyes, or preservatives pregnant or trying to get pregnant breast-feeding How should I use this medicine? This medicine is for injection under the skin of your upper leg (thigh), stomach area, or upper arm. It is given once every week (every 7 days). You will be taught how to prepare and give this medicine. Use exactly as directed. Take your medicine at regular intervals. Do not take it more often than directed. If you use this medicine with insulin, you should inject this medicine and the insulin separately. Do not mix them together. Do not give the injections right next to  each other. Change (rotate) injection sites with each injection. It is important that you put your used needles and syringes in a special sharps container. Do not put them in a trash can. If you do not have a sharps container, call your pharmacist or healthcare provider to get one. A special MedGuide will be given to you by the pharmacist with each prescription and refill. Be sure to read this information carefully each time. This drug comes with INSTRUCTIONS FOR USE. Ask your pharmacist for directions on how to use this drug. Read the information carefully. Talk to your pharmacist or health care provider if you have questions. Talk to your pediatrician regarding the use of this medicine in children. Special care may be needed. Overdosage: If you think you have taken too much of this medicine contact a poison control center or emergency room at once. NOTE: This medicine is only for you. Do not share this medicine with others. What if I miss a dose? If you miss a dose, take it as soon as you can within 5 days after the missed dose. Then take your next dose at your regular weekly time. If it has been longer than 5 days after the missed dose, do not take the missed dose. Take the next dose at your regular time. Do not take double or extra doses. If you have questions about a missed dose, contact your health care provider for advice. What may interact with this medicine? other medicines for diabetes Many medications may cause changes in blood sugar, these  include: alcohol containing beverages antiviral medicines for HIV or AIDS aspirin and aspirin-like drugs certain medicines for blood pressure, heart disease, irregular heart beat chromium diuretics male hormones, such as estrogens or progestins, birth control pills fenofibrate gemfibrozil isoniazid lanreotide male hormones or anabolic steroids MAOIs like Carbex, Eldepryl, Marplan, Nardil, and Parnate medicines for weight loss medicines for  allergies, asthma, cold, or cough medicines for depression, anxiety, or psychotic disturbances niacin nicotine NSAIDs, medicines for pain and inflammation, like ibuprofen or naproxen octreotide pasireotide pentamidine phenytoin probenecid quinolone antibiotics such as ciprofloxacin, levofloxacin, ofloxacin some herbal dietary supplements steroid medicines such as prednisone or cortisone sulfamethoxazole; trimethoprim thyroid hormones Some medications can hide the warning symptoms of low blood sugar (hypoglycemia). You may need to monitor your blood sugar more closely if you are taking one of these medications. These include: beta-blockers, often used for high blood pressure or heart problems (examples include atenolol, metoprolol, propranolol) clonidine guanethidine reserpine This list may not describe all possible interactions. Give your health care provider a list of all the medicines, herbs, non-prescription drugs, or dietary supplements you use. Also tell them if you smoke, drink alcohol, or use illegal drugs. Some items may interact with your medicine. What should I watch for while using this medicine? Visit your doctor or health care professional for regular checks on your progress. Drink plenty of fluids while taking this medicine. Check with your doctor or health care professional if you get an attack of severe diarrhea, nausea, and vomiting. The loss of too much body fluid can make it dangerous for you to take this medicine. A test called the HbA1C (A1C) will be monitored. This is a simple blood test. It measures your blood sugar control over the last 2 to 3 months. You will receive this test every 3 to 6 months. Learn how to check your blood sugar. Learn the symptoms of low and high blood sugar and how to manage them. Always carry a quick-source of sugar with you in case you have symptoms of low blood sugar. Examples include hard sugar candy or glucose tablets. Make sure others  know that you can choke if you eat or drink when you develop serious symptoms of low blood sugar, such as seizures or unconsciousness. They must get medical help at once. Tell your doctor or health care professional if you have high blood sugar. You might need to change the dose of your medicine. If you are sick or exercising more than usual, you might need to change the dose of your medicine. Do not skip meals. Ask your doctor or health care professional if you should avoid alcohol. Many nonprescription cough and cold products contain sugar or alcohol. These can affect blood sugar. Pens should never be shared. Even if the needle is changed, sharing may result in passing of viruses like hepatitis or HIV. Wear a medical ID bracelet or chain, and carry a card that describes your disease and details of your medicine and dosage times. Do not become pregnant while taking this medicine. Women should inform their doctor if they wish to become pregnant or think they might be pregnant. There is a potential for serious side effects to an unborn child. Talk to your health care professional or pharmacist for more information. What side effects may I notice from receiving this medicine? Side effects that you should report to your doctor or health care professional as soon as possible: allergic reactions like skin rash, itching or hives, swelling of the face, lips, or tongue  breathing problems changes in vision diarrhea that continues or is severe lump or swelling on the neck severe nausea signs and symptoms of infection like fever or chills; cough; sore throat; pain or trouble passing urine signs and symptoms of low blood sugar such as feeling anxious, confusion, dizziness, increased hunger, unusually weak or tired, sweating, shakiness, cold, irritable, headache, blurred vision, fast heartbeat, loss of consciousness signs and symptoms of kidney injury like trouble passing urine or change in the amount of  urine trouble swallowing unusual stomach upset or pain vomiting Side effects that usually do not require medical attention (report to your doctor or health care professional if they continue or are bothersome): constipation diarrhea nausea pain, redness, or irritation at site where injected stomach upset This list may not describe all possible side effects. Call your doctor for medical advice about side effects. You may report side effects to FDA at 1-800-FDA-1088. Where should I keep my medicine? Keep out of the reach of children. Store unopened pens in a refrigerator between 2 and 8 degrees C (36 and 46 degrees F). Do not freeze. Protect from light and heat. After you first use the pen, it can be stored for 56 days at room temperature between 15 and 30 degrees C (59 and 86 degrees F) or in a refrigerator. Throw away your used pen after 56 days or after the expiration date, whichever comes first. Do not store your pen with the needle attached. If the needle is left on, medicine may leak from the pen. NOTE: This sheet is a summary. It may not cover all possible information. If you have questions about this medicine, talk to your doctor, pharmacist, or health care provider.  2021 Elsevier/Gold Standard (2019-04-09 09:41:51)

## 2021-04-06 ENCOUNTER — Telehealth: Payer: Self-pay | Admitting: Family

## 2021-04-06 NOTE — Telephone Encounter (Signed)
-----   Message from De Hollingshead, [place] sent at 03/31/2021  1:24 PM EDT ----- You see patient 8/29 - he is on the high A1c report. Could be a great Mounjaro candidate.   Feel free to place CCM referral if appropriate/patient interested.   Thanks!

## 2021-04-06 NOTE — Telephone Encounter (Signed)
Wonderful idea catie  Judson Roch, please call patient and advise after seeing him yesterday, I would like to recommend a consult which is a phone call with our clinical pharmacist here who specializes in chronic disease management, diabetes management.  She is extremely helpful and knowledgeable.  She is able to review medications and even help with cost if that poses a burden to patient. Please see if he is open to a phone call with Catie from our office and I can place referral.

## 2021-04-07 NOTE — Telephone Encounter (Signed)
Patient would prefer to stay on Ozempic to see if a1c lowers. He will consider in the future.

## 2021-04-09 ENCOUNTER — Other Ambulatory Visit: Payer: Self-pay

## 2021-04-09 ENCOUNTER — Ambulatory Visit (INDEPENDENT_AMBULATORY_CARE_PROVIDER_SITE_OTHER): Payer: BC Managed Care – PPO

## 2021-04-09 DIAGNOSIS — E538 Deficiency of other specified B group vitamins: Secondary | ICD-10-CM

## 2021-04-09 MED ORDER — CYANOCOBALAMIN 1000 MCG/ML IJ SOLN
1000.0000 ug | Freq: Once | INTRAMUSCULAR | Status: AC
  Administered 2021-04-09: 1000 ug via INTRAMUSCULAR

## 2021-04-09 NOTE — Progress Notes (Signed)
Patient presented for B 12 injection to left deltoid, patient voiced no concerns nor showed any signs of distress during injection. 

## 2021-04-13 ENCOUNTER — Encounter: Payer: Self-pay | Admitting: Family

## 2021-04-13 DIAGNOSIS — E538 Deficiency of other specified B group vitamins: Secondary | ICD-10-CM | POA: Insufficient documentation

## 2021-04-16 ENCOUNTER — Other Ambulatory Visit: Payer: Self-pay

## 2021-04-16 ENCOUNTER — Other Ambulatory Visit: Payer: Self-pay | Admitting: Family

## 2021-04-16 ENCOUNTER — Ambulatory Visit (INDEPENDENT_AMBULATORY_CARE_PROVIDER_SITE_OTHER): Payer: BC Managed Care – PPO

## 2021-04-16 ENCOUNTER — Other Ambulatory Visit (INDEPENDENT_AMBULATORY_CARE_PROVIDER_SITE_OTHER): Payer: BC Managed Care – PPO

## 2021-04-16 DIAGNOSIS — M549 Dorsalgia, unspecified: Secondary | ICD-10-CM

## 2021-04-16 DIAGNOSIS — E538 Deficiency of other specified B group vitamins: Secondary | ICD-10-CM | POA: Diagnosis not present

## 2021-04-16 DIAGNOSIS — G8929 Other chronic pain: Secondary | ICD-10-CM

## 2021-04-16 MED ORDER — CYANOCOBALAMIN 1000 MCG/ML IJ SOLN
1000.0000 ug | Freq: Once | INTRAMUSCULAR | Status: AC
  Administered 2021-04-16: 1000 ug via INTRAMUSCULAR

## 2021-04-16 NOTE — Progress Notes (Signed)
Patient presented for B 12 injection to right deltoid, patient voiced no concerns nor showed any signs of distress during injection. 

## 2021-04-20 LAB — CELIAC DISEASE AB SCREEN W/RFX
Antigliadin Abs, IgA: 2 units (ref 0–19)
IgA/Immunoglobulin A, Serum: 302 mg/dL (ref 90–386)
Transglutaminase IgA: <2 U/mL (ref 0–3)

## 2021-04-21 LAB — ANTI-PARIETAL ANTIBODY: PARIETAL CELL AB SCREEN: NEGATIVE

## 2021-04-21 LAB — INTRINSIC FACTOR ANTIBODIES: Intrinsic Factor: NEGATIVE

## 2021-04-21 LAB — HOMOCYSTEINE: Homocysteine: 8.8 umol/L (ref <11.4)

## 2021-04-21 LAB — METHYLMALONIC ACID, SERUM: Methylmalonic Acid, Quant: 97 nmol/L (ref 87–318)

## 2021-04-23 ENCOUNTER — Other Ambulatory Visit: Payer: Self-pay

## 2021-04-23 ENCOUNTER — Ambulatory Visit (INDEPENDENT_AMBULATORY_CARE_PROVIDER_SITE_OTHER): Payer: BC Managed Care – PPO

## 2021-04-23 DIAGNOSIS — E538 Deficiency of other specified B group vitamins: Secondary | ICD-10-CM

## 2021-04-23 MED ORDER — CYANOCOBALAMIN 1000 MCG/ML IJ SOLN
1000.0000 ug | Freq: Once | INTRAMUSCULAR | Status: AC
  Administered 2021-04-23: 1000 ug via INTRAMUSCULAR

## 2021-04-23 NOTE — Progress Notes (Addendum)
Patient presented for B 12 injection to left deltoid, patient voiced no concerns nor showed any signs of distress during injection. 

## 2021-04-30 ENCOUNTER — Other Ambulatory Visit: Payer: Self-pay

## 2021-04-30 ENCOUNTER — Ambulatory Visit (INDEPENDENT_AMBULATORY_CARE_PROVIDER_SITE_OTHER): Payer: BC Managed Care – PPO

## 2021-04-30 DIAGNOSIS — E538 Deficiency of other specified B group vitamins: Secondary | ICD-10-CM

## 2021-04-30 MED ORDER — CYANOCOBALAMIN 1000 MCG/ML IJ SOLN
1000.0000 ug | Freq: Once | INTRAMUSCULAR | Status: AC
  Administered 2021-04-30: 1000 ug via INTRAMUSCULAR

## 2021-04-30 NOTE — Progress Notes (Signed)
Patient came in today for B-12 injection given in right deltoid IM. Patient tolerated well with no signs of distress.  °

## 2021-05-07 ENCOUNTER — Other Ambulatory Visit: Payer: Self-pay | Admitting: Family

## 2021-05-07 DIAGNOSIS — I10 Essential (primary) hypertension: Secondary | ICD-10-CM

## 2021-05-20 ENCOUNTER — Other Ambulatory Visit: Payer: Self-pay | Admitting: Family

## 2021-05-20 DIAGNOSIS — G8929 Other chronic pain: Secondary | ICD-10-CM

## 2021-05-31 ENCOUNTER — Other Ambulatory Visit: Payer: Self-pay

## 2021-05-31 ENCOUNTER — Ambulatory Visit (INDEPENDENT_AMBULATORY_CARE_PROVIDER_SITE_OTHER): Payer: BC Managed Care – PPO

## 2021-05-31 DIAGNOSIS — E538 Deficiency of other specified B group vitamins: Secondary | ICD-10-CM

## 2021-05-31 MED ORDER — CYANOCOBALAMIN 1000 MCG/ML IJ SOLN
1000.0000 ug | Freq: Once | INTRAMUSCULAR | Status: AC
  Administered 2021-05-31: 1000 ug via INTRAMUSCULAR

## 2021-05-31 NOTE — Progress Notes (Signed)
Patient presented for B 12 injection to left deltoid, patient voiced no concerns nor showed any signs of distress during injection. 

## 2021-06-22 ENCOUNTER — Other Ambulatory Visit: Payer: Self-pay | Admitting: Family

## 2021-06-22 DIAGNOSIS — G8929 Other chronic pain: Secondary | ICD-10-CM

## 2021-06-22 DIAGNOSIS — M549 Dorsalgia, unspecified: Secondary | ICD-10-CM

## 2021-07-06 ENCOUNTER — Ambulatory Visit: Payer: BC Managed Care – PPO

## 2021-07-06 ENCOUNTER — Ambulatory Visit: Payer: BC Managed Care – PPO | Admitting: Family

## 2021-07-16 ENCOUNTER — Encounter: Payer: Self-pay | Admitting: Nurse Practitioner

## 2021-07-16 ENCOUNTER — Other Ambulatory Visit: Payer: Self-pay

## 2021-07-16 ENCOUNTER — Telehealth (INDEPENDENT_AMBULATORY_CARE_PROVIDER_SITE_OTHER): Payer: BC Managed Care – PPO | Admitting: Nurse Practitioner

## 2021-07-16 DIAGNOSIS — J069 Acute upper respiratory infection, unspecified: Secondary | ICD-10-CM

## 2021-07-16 MED ORDER — AZITHROMYCIN 250 MG PO TABS: ORAL_TABLET | ORAL | 0 refills | Status: AC

## 2021-07-16 NOTE — Progress Notes (Signed)
Patient ID: Michael Boyle, male    DOB: Aug 29, 1965, 55 y.o.   MRN: [id number]  Virtual visit completed through Ernest, a video enabled telemedicine application. Due to national recommendations of social distancing due to COVID-19, a virtual visit is felt to be most appropriate for this patient at this time. Reviewed limitations, risks, security and privacy concerns of performing a virtual visit and the availability of in person appointments. I also reviewed that there may be a patient responsible charge related to this service. The patient agreed to proceed.   Patient location: home Provider location: [place] at [facility], office Persons participating in this virtual visit: patient, provider   If any vitals were documented, they were collected by patient at home unless specified below.    There were no vitals taken for this visit.   CC: Cough Subjective:   HPI: Michael Boyle is a 55 y.o. male presenting on 07/16/2021 for Cough (Sx started around 12/4 or 07/12/21, Chest congestion, stuffy nose, temp around 101, headache, runny nose, post nasal drip, body aches, diarrhea at first but better now, nausea. No sore throat. Covid test negative 07/14/21. Has been taking Mucinex and Tylenol.)  Symptoms 12/04/ or 12/05 Covid test was negative on 07/14/2021 No covid vaccine Wife has been sick and was treated Mucinex and tylenol OTC with some help No flu vaccine  Symptom progression: Symptoms are staying the same   Relevant past medical, surgical, family and social history reviewed and updated as indicated. Interim medical history since our last visit reviewed. Allergies and medications reviewed and updated. Outpatient Medications Prior to Visit  Medication Sig Dispense Refill   albuterol (VENTOLIN HFA) 108 (90 Base) MCG/ACT inhaler Inhale 2 puffs into the lungs every 6 (six) hours as needed for wheezing or shortness of breath. 1 each 2   allopurinol (ZYLOPRIM) 100 MG tablet Take 1  tablet (100 mg total) by mouth 2 (two) times daily. 180 tablet 0   aspirin EC 81 MG tablet Take 1 tablet (81 mg total) by mouth daily. 90 tablet 3   DULoxetine (CYMBALTA) 30 MG capsule Take one 30 mg capsule by mouth once a day for the first week. Then increase to two 30 mg capsules ( total 60mg ) by mouth once daily. 60 capsule 0   Fluticasone-Umeclidin-Vilant (TRELEGY ELLIPTA) 200-62.5-25 MCG/INH AEPB Inhale 1 puff into the lungs daily. 60 each 11   glucose blood test strip Use as instructed 50 each 12   losartan (COZAAR) 25 MG tablet Take 1 tablet (25 mg total) by mouth daily. 90 tablet 0   metFORMIN (GLUCOPHAGE XR) 500 MG 24 hr tablet Take 4 tablets (2,000 mg total) by mouth every evening. 120 tablet 3   pantoprazole (PROTONIX) 40 MG tablet Take 1 tablet (40 mg total) by mouth daily. 90 tablet 3   potassium chloride SA (KLOR-CON) 20 MEQ tablet TAKE ONE TABLET PER DAY WEDNESDAY & FRIDAYS. TAKE TWO TABLETS PER DAY THE REST OF DAYS OF THE WEEK. 90 tablet 3   rosuvastatin (CRESTOR) 10 MG tablet Take 1 tablet (10 mg total) by mouth daily. 90 tablet 3   Semaglutide,0.25 or 0.5MG /DOS, (OZEMPIC, 0.25 OR 0.5 MG/DOSE,) 2 MG/1.5ML SOPN Inject 0.25 mg into the skin once a week. After 4 weeks, increase to 0.5mg  [place] qwk. 3 mL 3   torsemide (DEMADEX) 20 MG tablet Take 1 tablet (20 mg total) by mouth 2 (two) times daily. 90 tablet 3   verapamil (CALAN-SR) 180 MG CR tablet Take 1  tablet (180 mg total) by mouth 2 (two) times daily. 180 tablet 0   No facility-administered medications prior to visit.     Per HPI unless specifically indicated in ROS section below Review of Systems  Constitutional:  Positive for fatigue and fever. Negative for chills.  HENT:  Positive for congestion, sinus pressure and sinus pain. Negative for ear discharge, ear pain and sore throat.   Respiratory:  Positive for cough and shortness of breath (baseline).   Cardiovascular:  Negative for chest pain.  Gastrointestinal:  Positive for  nausea. Negative for abdominal pain, diarrhea and vomiting.  Musculoskeletal:  Positive for myalgias.  Neurological:  Positive for headaches.  Objective:  There were no vitals taken for this visit.  Wt Readings from Last 3 Encounters:  04/05/21 (!) 409 lb 6.4 oz (185.7 kg)  01/19/21 (!) 413 lb 3.2 oz (187.4 kg)  10/13/20 (!) 403 lb 9.6 oz (183.1 kg)       Physical exam: Gen: alert, NAD, not ill appearing Pulm: speaks in complete sentences without increased work of breathing Psych: normal mood, normal thought content      Results for orders placed or performed in visit on 04/16/21  Methylmalonic acid, serum  Result Value Ref Range   Methylmalonic Acid, Quant 97 87 - 318 nmol/L  Intrinsic Factor Antibodies  Result Value Ref Range   Intrinsic Factor Negative Negative  Homocysteine  Result Value Ref Range   Homocysteine 8.8 <11.4 umol/L  Celiac Disease Ab Screen w/Rfx  Result Value Ref Range   Antigliadin Abs, IgA 2 0 - 19 units   Transglutaminase IgA <2 0 - 3 U/mL   IgA/Immunoglobulin A, Serum 302 90 - 386 mg/dL  Anti-parietal antibody  Result Value Ref Range   PARIETAL CELL AB SCREEN NEGATIVE NEGATIVE   Assessment & Plan:   Problem List Items Addressed This Visit   None Visit Diagnoses     Upper respiratory tract infection, unspecified type    -  Primary   Relevant Medications   azithromycin (ZITHROMAX) 250 MG tablet        No orders of the defined types were placed in this encounter.  No orders of the defined types were placed in this encounter.   I discussed the assessment and treatment plan with the patient. The patient was provided an opportunity to ask questions and all were answered. The patient agreed with the plan and demonstrated an understanding of the instructions. The patient was advised to call back or seek an in-person evaluation if the symptoms worsen or if the condition fails to improve as anticipated.  Follow up plan: No follow-ups on  file.  Romilda Garret, NP

## 2021-07-16 NOTE — Assessment & Plan Note (Signed)
Given patient's history and comorbidities will elect to treat patient.  No longer smokes but has a long smoking history.  Also diagnosed with COPD, OSA, diabetes mellitus.  Patient started on azithromycin 250 mg as directed.  Understands refusal to seek urgent or emergent health care.  Patient knowledge

## 2021-07-20 ENCOUNTER — Other Ambulatory Visit: Payer: Self-pay

## 2021-07-20 ENCOUNTER — Ambulatory Visit (INDEPENDENT_AMBULATORY_CARE_PROVIDER_SITE_OTHER): Payer: 59

## 2021-07-20 DIAGNOSIS — E538 Deficiency of other specified B group vitamins: Secondary | ICD-10-CM

## 2021-07-20 MED ORDER — CYANOCOBALAMIN 1000 MCG/ML IJ SOLN
1000.0000 ug | Freq: Once | INTRAMUSCULAR | Status: AC
Start: 2021-07-20 — End: 2021-07-20
  Administered 2021-07-20: 1000 ug via INTRAMUSCULAR

## 2021-07-20 NOTE — Progress Notes (Signed)
Patient presented for B 12 injection to left deltoid, patient voiced no concerns nor showed any signs of distress during injection. 

## 2021-07-27 ENCOUNTER — Other Ambulatory Visit: Payer: Self-pay

## 2021-07-27 ENCOUNTER — Telehealth: Payer: 59 | Admitting: Family Medicine

## 2021-07-27 ENCOUNTER — Inpatient Hospital Stay
Admission: EM | Admit: 2021-07-27 | Discharge: 2021-07-30 | DRG: 872 | Disposition: A | Discharge status: Home / Self Care | Payer: BC Managed Care – PPO | Attending: Internal Medicine | Admitting: Internal Medicine

## 2021-07-27 ENCOUNTER — Emergency Department: Payer: BC Managed Care – PPO

## 2021-07-27 DIAGNOSIS — M549 Dorsalgia, unspecified: Secondary | ICD-10-CM | POA: Diagnosis present

## 2021-07-27 DIAGNOSIS — Z6841 Body Mass Index (BMI) 40.0 and over, adult: Secondary | ICD-10-CM | POA: Diagnosis not present

## 2021-07-27 DIAGNOSIS — N3001 Acute cystitis with hematuria: Secondary | ICD-10-CM | POA: Diagnosis present

## 2021-07-27 DIAGNOSIS — Z86010 Personal history of colonic polyps: Secondary | ICD-10-CM

## 2021-07-27 DIAGNOSIS — A4151 Sepsis due to Escherichia coli [E. coli]: Secondary | ICD-10-CM | POA: Diagnosis not present

## 2021-07-27 DIAGNOSIS — M7989 Other specified soft tissue disorders: Secondary | ICD-10-CM

## 2021-07-27 DIAGNOSIS — R609 Edema, unspecified: Secondary | ICD-10-CM | POA: Diagnosis present

## 2021-07-27 DIAGNOSIS — Z7984 Long term (current) use of oral hypoglycemic drugs: Secondary | ICD-10-CM

## 2021-07-27 DIAGNOSIS — K219 Gastro-esophageal reflux disease without esophagitis: Secondary | ICD-10-CM | POA: Diagnosis present

## 2021-07-27 DIAGNOSIS — J449 Chronic obstructive pulmonary disease, unspecified: Secondary | ICD-10-CM | POA: Diagnosis present

## 2021-07-27 DIAGNOSIS — J40 Bronchitis, not specified as acute or chronic: Secondary | ICD-10-CM

## 2021-07-27 DIAGNOSIS — Z23 Encounter for immunization: Secondary | ICD-10-CM | POA: Diagnosis not present

## 2021-07-27 DIAGNOSIS — E876 Hypokalemia: Secondary | ICD-10-CM

## 2021-07-27 DIAGNOSIS — I959 Hypotension, unspecified: Secondary | ICD-10-CM | POA: Diagnosis present

## 2021-07-27 DIAGNOSIS — Z7982 Long term (current) use of aspirin: Secondary | ICD-10-CM

## 2021-07-27 DIAGNOSIS — Z833 Family history of diabetes mellitus: Secondary | ICD-10-CM

## 2021-07-27 DIAGNOSIS — I10 Essential (primary) hypertension: Secondary | ICD-10-CM | POA: Diagnosis present

## 2021-07-27 DIAGNOSIS — E119 Type 2 diabetes mellitus without complications: Secondary | ICD-10-CM | POA: Diagnosis present

## 2021-07-27 DIAGNOSIS — Z20822 Contact with and (suspected) exposure to covid-19: Secondary | ICD-10-CM | POA: Diagnosis present

## 2021-07-27 DIAGNOSIS — R300 Dysuria: Secondary | ICD-10-CM

## 2021-07-27 DIAGNOSIS — E785 Hyperlipidemia, unspecified: Secondary | ICD-10-CM | POA: Diagnosis present

## 2021-07-27 DIAGNOSIS — F32A Depression, unspecified: Secondary | ICD-10-CM | POA: Diagnosis present

## 2021-07-27 DIAGNOSIS — E1169 Type 2 diabetes mellitus with other specified complication: Secondary | ICD-10-CM

## 2021-07-27 DIAGNOSIS — Z87891 Personal history of nicotine dependence: Secondary | ICD-10-CM | POA: Diagnosis not present

## 2021-07-27 DIAGNOSIS — E871 Hypo-osmolality and hyponatremia: Secondary | ICD-10-CM | POA: Diagnosis present

## 2021-07-27 DIAGNOSIS — A419 Sepsis, unspecified organism: Secondary | ICD-10-CM | POA: Diagnosis present

## 2021-07-27 DIAGNOSIS — I872 Venous insufficiency (chronic) (peripheral): Secondary | ICD-10-CM | POA: Diagnosis present

## 2021-07-27 DIAGNOSIS — Z79899 Other long term (current) drug therapy: Secondary | ICD-10-CM

## 2021-07-27 DIAGNOSIS — I471 Supraventricular tachycardia: Secondary | ICD-10-CM | POA: Diagnosis present

## 2021-07-27 DIAGNOSIS — G8929 Other chronic pain: Secondary | ICD-10-CM | POA: Diagnosis present

## 2021-07-27 DIAGNOSIS — Z800 Family history of malignant neoplasm of digestive organs: Secondary | ICD-10-CM

## 2021-07-27 DIAGNOSIS — M109 Gout, unspecified: Secondary | ICD-10-CM | POA: Diagnosis present

## 2021-07-27 DIAGNOSIS — Z807 Family history of other malignant neoplasms of lymphoid, hematopoietic and related tissues: Secondary | ICD-10-CM

## 2021-07-27 DIAGNOSIS — Z8042 Family history of malignant neoplasm of prostate: Secondary | ICD-10-CM

## 2021-07-27 LAB — LACTIC ACID, PLASMA
Lactic Acid, Venous: 3 mmol/L (ref 0.5–1.9)
Lactic Acid, Venous: 2 mmol/L (ref 0.5–1.9)

## 2021-07-27 LAB — PROTIME-INR
INR: 1.1 (ref 0.8–1.2)
Prothrombin Time: 14.1 seconds (ref 11.4–15.2)

## 2021-07-27 LAB — COMPREHENSIVE METABOLIC PANEL
ALT: 18 U/L (ref 0–44)
AST: 20 U/L (ref 15–41)
Albumin: 3.5 g/dL (ref 3.5–5.0)
Alkaline Phosphatase: 49 U/L (ref 38–126)
Anion gap: 9 (ref 5–15)
BUN: 9 mg/dL (ref 6–20)
CO2: 28 mmol/L (ref 22–32)
Calcium: 8.6 mg/dL — ABNORMAL LOW (ref 8.9–10.3)
Chloride: 97 mmol/L — ABNORMAL LOW (ref 98–111)
Creatinine, Ser: 0.87 mg/dL (ref 0.61–1.24)
GFR, Estimated: >60 mL/min (ref >60)
Glucose, Bld: 142 mg/dL — ABNORMAL HIGH (ref 70–99)
Potassium: 3.1 mmol/L — ABNORMAL LOW (ref 3.5–5.1)
Sodium: 134 mmol/L — ABNORMAL LOW (ref 135–145)
Total Bilirubin: 1.4 mg/dL — ABNORMAL HIGH (ref 0.3–1.2)
Total Protein: 7.4 g/dL (ref 6.5–8.1)

## 2021-07-27 LAB — CBC WITH DIFFERENTIAL/PLATELET
Abs Immature Granulocytes: 0.13 10*3/uL — ABNORMAL HIGH (ref 0.00–0.07)
Basophils Absolute: 0.1 10*3/uL (ref 0.0–0.1)
Basophils Relative: 0 %
Eosinophils Absolute: 0 10*3/uL (ref 0.0–0.5)
Eosinophils Relative: 0 %
HCT: 41.1 % (ref 39.0–52.0)
Hemoglobin: 13.2 g/dL (ref 13.0–17.0)
Immature Granulocytes: 1 %
Lymphocytes Relative: 5 %
Lymphs Abs: 1 10*3/uL (ref 0.7–4.0)
MCH: 26.2 pg (ref 26.0–34.0)
MCHC: 32.1 g/dL (ref 30.0–36.0)
MCV: 81.7 fL (ref 80.0–100.0)
Monocytes Absolute: 1.6 10*3/uL — ABNORMAL HIGH (ref 0.1–1.0)
Monocytes Relative: 7 %
Neutro Abs: 18.5 10*3/uL — ABNORMAL HIGH (ref 1.7–7.7)
Neutrophils Relative %: 87 %
Platelets: 285 10*3/uL (ref 150–400)
RBC: 5.03 MIL/uL (ref 4.22–5.81)
RDW: 14.6 % (ref 11.5–15.5)
WBC: 21.3 10*3/uL — ABNORMAL HIGH (ref 4.0–10.5)
nRBC: 0 % (ref 0.0–0.2)

## 2021-07-27 LAB — URINALYSIS, COMPLETE (UACMP) WITH MICROSCOPIC
Bilirubin Urine: NEGATIVE
Glucose, UA: NEGATIVE mg/dL
Ketones, ur: NEGATIVE mg/dL
Nitrite: POSITIVE — AB
Protein, ur: 100 mg/dL — AB
Specific Gravity, Urine: 1.028 (ref 1.005–1.030)
Squamous Epithelial / HPF: NONE SEEN (ref 0–5)
WBC, UA: >50 WBC/hpf — ABNORMAL HIGH (ref 0–5)
pH: 5 (ref 5.0–8.0)

## 2021-07-27 LAB — RESP PANEL BY RT-PCR (FLU A&B, COVID) ARPGX2
Influenza A by PCR: NEGATIVE
Influenza B by PCR: NEGATIVE
SARS Coronavirus 2 by RT PCR: NEGATIVE

## 2021-07-27 LAB — GLUCOSE, CAPILLARY: Glucose-Capillary: 152 mg/dL — ABNORMAL HIGH (ref 70–99)

## 2021-07-27 LAB — PROCALCITONIN: Procalcitonin: 0.26 ng/mL

## 2021-07-27 LAB — MAGNESIUM: Magnesium: 1.5 mg/dL — ABNORMAL LOW (ref 1.7–2.4)

## 2021-07-27 LAB — APTT: aPTT: 32 seconds (ref 24–36)

## 2021-07-27 MED ORDER — ROSUVASTATIN CALCIUM 10 MG PO TABS
10.0000 mg | ORAL_TABLET | Freq: Every day | ORAL | Status: DC
  Administered 2021-07-27 – 2021-07-29 (×3): 10 mg via ORAL
  Filled 2021-07-27 (×4): qty 1

## 2021-07-27 MED ORDER — LACTATED RINGERS IV SOLN: INTRAVENOUS | Status: DC

## 2021-07-27 MED ORDER — INFLUENZA VAC SPLIT QUAD 0.5 ML IM SUSY
0.5000 mL | PREFILLED_SYRINGE | INTRAMUSCULAR | Status: AC
  Administered 2021-07-29: 13:00:00 0.5 mL via INTRAMUSCULAR
  Filled 2021-07-27: qty 0.5

## 2021-07-27 MED ORDER — INSULIN ASPART 100 UNIT/ML IJ SOLN
0.0000 [IU] | Freq: Three times a day (TID) | INTRAMUSCULAR | Status: DC
  Administered 2021-07-28 (×2): 4 [IU] via SUBCUTANEOUS
  Administered 2021-07-29 – 2021-07-30 (×4): 3 [IU] via SUBCUTANEOUS
  Filled 2021-07-27 (×6): qty 1

## 2021-07-27 MED ORDER — PANTOPRAZOLE SODIUM 40 MG PO TBEC
40.0000 mg | DELAYED_RELEASE_TABLET | Freq: Every day | ORAL | Status: DC
  Administered 2021-07-28 – 2021-07-30 (×3): 40 mg via ORAL
  Filled 2021-07-27 (×3): qty 1

## 2021-07-27 MED ORDER — VERAPAMIL HCL ER 180 MG PO TBCR
180.0000 mg | EXTENDED_RELEASE_TABLET | Freq: Two times a day (BID) | ORAL | Status: DC
  Administered 2021-07-28 – 2021-07-30 (×5): 180 mg via ORAL
  Filled 2021-07-27 (×6): qty 1

## 2021-07-27 MED ORDER — SODIUM CHLORIDE 0.9 % IV SOLN
2.0000 g | INTRAVENOUS | Status: DC
  Administered 2021-07-27 – 2021-07-29 (×3): 2 g via INTRAVENOUS
  Filled 2021-07-27: qty 20
  Filled 2021-07-27: qty 2
  Filled 2021-07-27: qty 20
  Filled 2021-07-27: qty 2

## 2021-07-27 MED ORDER — IBUPROFEN 400 MG PO TABS: 400.0000 mg | ORAL_TABLET | Freq: Four times a day (QID) | ORAL | Status: DC | PRN

## 2021-07-27 MED ORDER — ONDANSETRON HCL 4 MG/2ML IJ SOLN: 4.0000 mg | Freq: Four times a day (QID) | INTRAMUSCULAR | Status: DC | PRN

## 2021-07-27 MED ORDER — ONDANSETRON HCL 4 MG PO TABS: 4.0000 mg | ORAL_TABLET | Freq: Four times a day (QID) | ORAL | Status: DC | PRN

## 2021-07-27 MED ORDER — DULOXETINE HCL 30 MG PO CPEP
30.0000 mg | ORAL_CAPSULE | Freq: Every day | ORAL | Status: DC
  Administered 2021-07-28 – 2021-07-30 (×3): 30 mg via ORAL
  Filled 2021-07-27 (×4): qty 1

## 2021-07-27 MED ORDER — MAGNESIUM SULFATE 4 GM/100ML IV SOLN
4.0000 g | Freq: Once | INTRAVENOUS | Status: AC
  Administered 2021-07-27: 17:00:00 4 g via INTRAVENOUS
  Filled 2021-07-27: qty 100

## 2021-07-27 MED ORDER — LOSARTAN POTASSIUM 50 MG PO TABS: 25.0000 mg | ORAL_TABLET | Freq: Every day | ORAL | Status: DC

## 2021-07-27 MED ORDER — INSULIN ASPART 100 UNIT/ML IJ SOLN: 0.0000 [IU] | Freq: Every day | INTRAMUSCULAR | Status: DC

## 2021-07-27 MED ORDER — ALBUTEROL SULFATE (2.5 MG/3ML) 0.083% IN NEBU: 3.0000 mL | INHALATION_SOLUTION | Freq: Four times a day (QID) | RESPIRATORY_TRACT | Status: DC | PRN

## 2021-07-27 MED ORDER — TORSEMIDE 20 MG PO TABS
20.0000 mg | ORAL_TABLET | Freq: Two times a day (BID) | ORAL | Status: DC
  Administered 2021-07-28 (×2): 20 mg via ORAL
  Filled 2021-07-27 (×2): qty 1

## 2021-07-27 MED ORDER — ACETAMINOPHEN 650 MG RE SUPP: 650.0000 mg | Freq: Four times a day (QID) | RECTAL | Status: DC | PRN

## 2021-07-27 MED ORDER — ACETAMINOPHEN 325 MG PO TABS
650.0000 mg | ORAL_TABLET | Freq: Four times a day (QID) | ORAL | Status: DC | PRN
  Administered 2021-07-29: 05:00:00 650 mg via ORAL
  Filled 2021-07-27: qty 2

## 2021-07-27 MED ORDER — HYDROCODONE-ACETAMINOPHEN 5-325 MG PO TABS: 1.0000 | ORAL_TABLET | ORAL | Status: DC | PRN

## 2021-07-27 MED ORDER — LOSARTAN POTASSIUM 25 MG PO TABS
25.0000 mg | ORAL_TABLET | Freq: Every day | ORAL | Status: DC
  Filled 2021-07-27: qty 1

## 2021-07-27 MED ORDER — ENOXAPARIN SODIUM 100 MG/ML IJ SOSY
90.0000 mg | PREFILLED_SYRINGE | Freq: Every day | INTRAMUSCULAR | Status: DC
  Administered 2021-07-27 – 2021-07-30 (×3): 90 mg via SUBCUTANEOUS
  Filled 2021-07-27 (×4): qty 0.9

## 2021-07-27 MED ORDER — ACETAMINOPHEN 500 MG PO TABS
1000.0000 mg | ORAL_TABLET | Freq: Once | ORAL | Status: AC
  Administered 2021-07-27: 12:00:00 1000 mg via ORAL
  Filled 2021-07-27: qty 2

## 2021-07-27 MED ORDER — LACTATED RINGERS IV BOLUS (SEPSIS)
1000.0000 mL | Freq: Once | INTRAVENOUS | Status: AC
  Administered 2021-07-27: 16:00:00 1000 mL via INTRAVENOUS

## 2021-07-27 MED ORDER — MORPHINE SULFATE (PF) 2 MG/ML IV SOLN: 2.0000 mg | INTRAVENOUS | Status: DC | PRN

## 2021-07-27 MED ORDER — ASPIRIN EC 81 MG PO TBEC
81.0000 mg | DELAYED_RELEASE_TABLET | Freq: Every day | ORAL | Status: DC
  Administered 2021-07-28 – 2021-07-30 (×3): 81 mg via ORAL
  Filled 2021-07-27 (×3): qty 1

## 2021-07-27 MED ORDER — POTASSIUM CHLORIDE CRYS ER 20 MEQ PO TBCR
40.0000 meq | EXTENDED_RELEASE_TABLET | Freq: Once | ORAL | Status: AC
  Administered 2021-07-27: 16:00:00 40 meq via ORAL
  Filled 2021-07-27: qty 2

## 2021-07-27 MED ORDER — LACTATED RINGERS IV BOLUS (SEPSIS)
400.0000 mL | Freq: Once | INTRAVENOUS | Status: AC
  Administered 2021-07-27: 16:00:00 400 mL via INTRAVENOUS

## 2021-07-27 MED ORDER — VERAPAMIL HCL ER 180 MG PO TBCR
180.0000 mg | EXTENDED_RELEASE_TABLET | Freq: Two times a day (BID) | ORAL | Status: DC
  Filled 2021-07-27: qty 1

## 2021-07-27 MED ORDER — TORSEMIDE 20 MG PO TABS: 20.0000 mg | ORAL_TABLET | Freq: Two times a day (BID) | ORAL | Status: DC

## 2021-07-27 MED ORDER — ALLOPURINOL 100 MG PO TABS
100.0000 mg | ORAL_TABLET | Freq: Two times a day (BID) | ORAL | Status: DC
  Administered 2021-07-27 – 2021-07-30 (×6): 100 mg via ORAL
  Filled 2021-07-27 (×7): qty 1

## 2021-07-27 NOTE — ED Provider Notes (Signed)
[facility] Emergency Department Provider Note  ____________________________________________   Event Date/Time   First MD Initiated Contact with Patient 07/27/21 1439     (approximate)  I have reviewed the triage vital signs and the nursing notes.   HISTORY  Chief Complaint Fever   HPI Michael Boyle is a 55 y.o. male with below noted PMH who presents for assessment of fevers, urinary incontinence, and burning with urination that began yesterday. Pt also endorses mild cough over last 2-3 days. Endorses remote tobacco abuse but does not currently smoke. Denies HA, earache, sore throat, congestion, CP, SOB different from baseline, abd pain, back pain, blood in urine, diarrhea, constipation, rash or extremity pain. No prior similar episodes. No other acute concerns at this time.          Past Medical History:  Diagnosis Date   Colon polyps    COPD (chronic obstructive pulmonary disease) ([place])    Diabetes mellitus with complication (HCC)    Genetic testing 12/07/2017   Common Cancers panel (47 genes) @ [facility] - No pathogenic mutations detected   GERD (gastroesophageal reflux disease)    Gout    Incomplete left bundle branch block    Incomplete left bundle branch block (LBBB)    a. TTE 4/17: EF of 60-65%, unable to assess wall motion, normal LV diastolic function parameters, mildly calcifief mitral annulus   Morbid obesity (HCC)    Palpitations    a. felt to be pSVT   Palpitations    Venous insufficiency     Patient Active Problem List   Diagnosis Date Noted   Upper respiratory tract infection 07/16/2021   B12 deficiency 04/13/2021   Memory change 04/05/2021   Headache 04/05/2021   Chronic back pain 09/11/2020   Atherosclerosis of aorta ([place]) 04/24/2020   Zoster 04/17/2020   Knee pain 08/29/2019   Edema, peripheral 06/18/2019   Embedded toenail 06/18/2019   Fast heart beat 06/18/2019   History of atrial fibrillation 06/18/2019   Need for  prophylactic vaccination and inoculation against influenza 06/18/2019   Obstructive apnea 06/18/2019   Diabetes mellitus without complication ([place]) 27/01/2375   Genetic testing 12/07/2017   Colon polyps    Annual physical exam 03/24/2017   Tachycardia, paroxysmal ([place]) 09/23/2015   Diuretic-induced hypokalemia 09/23/2015   Chronic thoracic back pain 09/23/2015   Abnormal finding on EKG 09/23/2015   Cardiac dysrhythmia 06/18/2015   Personal history of other diseases of the circulatory system 06/18/2015   Benign essential hypertension 06/18/2015   COPD (chronic obstructive pulmonary disease) ([place]) 05/11/2015   Acid reflux 05/11/2015   Gout 05/11/2015   HLD (hyperlipidemia) 05/11/2015   Morbid obesity ([place]) 05/11/2015   Left leg swelling 05/11/2015    Past Surgical History:  Procedure Laterality Date   COLONOSCOPY WITH PROPOFOL N/A 08/14/2015   Procedure: COLONOSCOPY WITH PROPOFOL;  Surgeon: Lollie Sails, MD;  Location: [facility] ENDOSCOPY;  Service: Endoscopy;  Laterality: N/A;   COLONOSCOPY WITH PROPOFOL N/A 11/02/2017   Procedure: COLONOSCOPY WITH PROPOFOL;  Surgeon: Lollie Sails, MD;  Location: [facility] ENDOSCOPY;  Service: Endoscopy;  Laterality: N/A;   COLONOSCOPY WITH PROPOFOL N/A 02/28/2020   Procedure: COLONOSCOPY WITH PROPOFOL;  Surgeon: Jonathon Bellows, MD;  Location: [facility] ENDOSCOPY;  Service: Gastroenterology;  Laterality: N/A;   ESOPHAGOGASTRODUODENOSCOPY (EGD) WITH PROPOFOL N/A 08/14/2015   Procedure: ESOPHAGOGASTRODUODENOSCOPY (EGD) WITH PROPOFOL;  Surgeon: Lollie Sails, MD;  Location: [facility] ENDOSCOPY;  Service: Endoscopy;  Laterality: N/A;   WISDOM TOOTH EXTRACTION  Prior to Admission medications   Medication Sig Start Date End Date Taking? Authorizing Provider  albuterol (VENTOLIN HFA) 108 (90 Base) MCG/ACT inhaler Inhale 2 puffs into the lungs every 6 (six) hours as needed for wheezing or shortness of breath. 09/11/20  Yes Burnard Hawthorne, FNP  allopurinol  (ZYLOPRIM) 100 MG tablet Take 1 tablet (100 mg total) by mouth 2 (two) times daily. 03/19/21  Yes Burnard Hawthorne, FNP  aspirin EC 81 MG tablet Take 1 tablet (81 mg total) by mouth daily. 09/11/20  Yes Burnard Hawthorne, FNP  DULoxetine (CYMBALTA) 30 MG capsule Take one 30 mg capsule by mouth once a day for the first week. Then increase to two 30 mg capsules ( total 60mg ) by mouth once daily. Patient taking differently: 30 mg daily. Take one 30 mg capsule by mouth once a day for the first week. Then increase to two 30 mg capsules ( total 60mg ) by mouth once daily. 06/22/21  Yes Burnard Hawthorne, FNP  losartan (COZAAR) 25 MG tablet Take 1 tablet (25 mg total) by mouth daily. 05/07/21  Yes Burnard Hawthorne, FNP  metFORMIN (GLUCOPHAGE XR) 500 MG 24 hr tablet Take 4 tablets (2,000 mg total) by mouth every evening. 04/05/21  Yes Arnett, Yvetta Coder, FNP  pantoprazole (PROTONIX) 40 MG tablet Take 1 tablet (40 mg total) by mouth daily. 09/11/20  Yes Burnard Hawthorne, FNP  potassium chloride SA (KLOR-CON) 20 MEQ tablet TAKE ONE TABLET PER DAY WEDNESDAY & FRIDAYS. TAKE TWO TABLETS PER DAY THE REST OF DAYS OF THE WEEK. 09/11/20  Yes Arnett, Yvetta Coder, FNP  rosuvastatin (CRESTOR) 10 MG tablet Take 1 tablet (10 mg total) by mouth daily. 09/11/20  Yes Burnard Hawthorne, FNP  torsemide (DEMADEX) 20 MG tablet Take 1 tablet (20 mg total) by mouth 2 (two) times daily. 11/04/20  Yes Arnett, Yvetta Coder, FNP  verapamil (CALAN-SR) 180 MG CR tablet Take 1 tablet (180 mg total) by mouth 2 (two) times daily. Patient taking differently: Take 360 mg by mouth at bedtime. 03/19/21  Yes Burnard Hawthorne, FNP  Fluticasone-Umeclidin-Vilant (TRELEGY ELLIPTA) 200-62.5-25 MCG/INH AEPB Inhale 1 puff into the lungs daily. Patient not taking: Reported on 07/27/2021 10/13/20   Parrett, Fonnie Mu, NP  glucose blood test strip Use as instructed 05/09/19   Guse, Jacquelynn Cree, FNP  Semaglutide,0.25 or 0.5MG /DOS, (OZEMPIC, 0.25 OR 0.5 MG/DOSE,) 2  MG/1.5ML SOPN Inject 0.25 mg into the skin once a week. After 4 weeks, increase to 0.5mg  [place] qwk. Patient not taking: Reported on 07/27/2021 04/05/21   Burnard Hawthorne, FNP    Allergies Patient has no known allergies.  Family History  Problem Relation Age of Onset   Cancer Sister        lymphoma dx 25s; currently 28   Diabetes Sister    Cirrhosis Sister    Colon cancer Paternal Uncle        dx 65s; deceased 53   Prostate cancer Paternal Uncle        dx 13s; currently 73   Dementia Maternal Aunt 85   Thyroid cancer Neg Hx     Social History Social History   Tobacco Use   Smoking status: Former    Packs/day: 1.75    Years: 30.00    Pack years: 52.50    Types: Cigarettes    Quit date: 06/13/2012    Years since quitting: 9.1   Smokeless tobacco: Never  Vaping Use   Vaping Use: Former  Substance Use Topics   Alcohol use: No    Alcohol/week: 0.0 standard drinks   Drug use: No    Review of Systems  Review of Systems  Constitutional:  Positive for fever. Negative for chills.  HENT:  Negative for sore throat.   Eyes:  Negative for pain.  Respiratory:  Positive for cough. Negative for stridor.   Cardiovascular:  Negative for chest pain.  Gastrointestinal:  Positive for nausea. Negative for vomiting.  Genitourinary:  Positive for dysuria.  Skin:  Negative for rash.  Neurological:  Negative for seizures, loss of consciousness and headaches.  Psychiatric/Behavioral:  Negative for suicidal ideas.   All other systems reviewed and are negative.    ____________________________________________   PHYSICAL EXAM:  VITAL SIGNS: ED Triage Vitals  Enc Vitals Group     BP 07/27/21 1223 (!) 119/57     Pulse Rate 07/27/21 1223 88     Resp 07/27/21 1223 20     Temp 07/27/21 1223 (!) 101.3 F (38.5 C)     Temp Source 07/27/21 1223 Oral     SpO2 07/27/21 1223 91 %     Weight 07/27/21 1223 (!) 402 lb (182.3 kg)     Height 07/27/21 1223 5\' 11"  (1.803 m)     Head  Circumference --      Peak Flow --      Pain Score 07/27/21 1222 5     Pain Loc --      Pain Edu? --      Excl. in [place]? --    Vitals:   07/27/21 1223 07/27/21 1500  BP: (!) 119/57 (!) 120/106  Pulse: 88 86  Resp: 20 20  Temp: (!) 101.3 F (38.5 C)   SpO2: 91% 93%   Physical Exam Vitals and nursing note reviewed. Exam conducted with a chaperone present.  Constitutional:      General: He is not in acute distress.    Appearance: He is well-developed. He is obese.  HENT:     Head: Normocephalic and atraumatic.     Right Ear: External ear normal.     Left Ear: External ear normal.     Nose: Nose normal.  Eyes:     Conjunctiva/sclera: Conjunctivae normal.  Cardiovascular:     Rate and Rhythm: Normal rate and regular rhythm.     Heart sounds: No murmur heard. Pulmonary:     Effort: Pulmonary effort is normal. No respiratory distress.     Breath sounds: Normal breath sounds.  Abdominal:     Palpations: Abdomen is soft.     Tenderness: There is no abdominal tenderness.  Musculoskeletal:        General: No swelling.     Cervical back: Neck supple.  Skin:    General: Skin is warm and dry.     Capillary Refill: Capillary refill takes less than 2 seconds.  Neurological:     Mental Status: He is alert.  Psychiatric:        Mood and Affect: Mood normal.    Scrotum, penile shaft, inguinal areas are unremarkable.   ____________________________________________   LABS (all labs ordered are listed, but only abnormal results are displayed)  Labs Reviewed  URINALYSIS, COMPLETE (UACMP) WITH MICROSCOPIC - Abnormal; Notable for the following components:      Result Value   Color, Urine AMBER (*)    APPearance HAZY (*)    Hgb urine dipstick SMALL (*)    Protein, ur 100 (*)    Nitrite POSITIVE (*)  Leukocytes,Ua SMALL (*)    WBC, UA >50 (*)    Bacteria, UA MANY (*)    All other components within normal limits  COMPREHENSIVE METABOLIC PANEL - Abnormal; Notable for the  following components:   Sodium 134 (*)    Potassium 3.1 (*)    Chloride 97 (*)    Glucose, Bld 142 (*)    Calcium 8.6 (*)    Total Bilirubin 1.4 (*)    All other components within normal limits  LACTIC ACID, PLASMA - Abnormal; Notable for the following components:   Lactic Acid, Venous 3.0 (*)    All other components within normal limits  CBC WITH DIFFERENTIAL/PLATELET - Abnormal; Notable for the following components:   WBC 21.3 (*)    Neutro Abs 18.5 (*)    Monocytes Absolute 1.6 (*)    Abs Immature Granulocytes 0.13 (*)    All other components within normal limits  MAGNESIUM - Abnormal; Notable for the following components:   Magnesium 1.5 (*)    All other components within normal limits  RESP PANEL BY RT-PCR (FLU A&B, COVID) ARPGX2  CULTURE, BLOOD (ROUTINE X 2)  CULTURE, BLOOD (ROUTINE X 2)  URINE CULTURE  PROTIME-INR  APTT  PROCALCITONIN  LACTIC ACID, PLASMA   ____________________________________________  EKG  ECG shows A. fib versus sinus rhythm as there is significant artifact in inferior and lateral leads with apparent P waves visible in V4 V5 and V6 and a ventricular rate of 97 with otherwise unremarkable intervals, right axis deviation and no clear evidence of acute ischemia. ____________________________________________  RADIOLOGY  ED MD interpretation: Chest x-ray shows no acute no focal consolidation, effusion, edema pneumothorax or other acute thoracic process.  Official radiology report(s): DG Chest 2 View  Result Date: 07/27/2021 CLINICAL DATA:  Suspected sepsis EXAM: CHEST - 2 VIEW COMPARISON:  April 2021 FINDINGS: The heart size and mediastinal contours are within normal limits. Atelectasis/scarring at the lung bases. No pleural effusion. No acute osseous abnormality. IMPRESSION: Atelectasis/scarring at the lung bases. Electronically Signed   By: Macy Mis M.D.   On: 07/27/2021 13:21     ____________________________________________   PROCEDURES  Procedure(s) performed (including Critical Care):  .Critical Care Performed by: Lucrezia Starch, MD Authorized by: Lucrezia Starch, MD   Critical care provider statement:    Critical care time (minutes):  30   Critical care was necessary to treat or prevent imminent or life-threatening deterioration of the following conditions:  Sepsis   Critical care was time spent personally by me on the following activities:  Development of treatment plan with patient or surrogate, discussions with consultants, evaluation of patient's response to treatment, examination of patient, ordering and review of laboratory studies, ordering and review of radiographic studies, ordering and performing treatments and interventions, pulse oximetry, re-evaluation of patient's condition and review of old charts   ____________________________________________   INITIAL IMPRESSION / [place] / ED COURSE        Patient presents with above history and exam for assessment of fever, urinary incontinence, and burning with urination that began yesterday as well as a couple days of mild cough. On arrival pt is febrile at 101.3 and borderline tachypnic at 20 and hypoxic at 91% with otherwise stable VS.   Unclear if cough is related to the urinary sx or separate issue. Dx for cough includes COPD exacerbation, pneumonia, CHF, arrhythmia, ACS, PE,   Chest x-ray shows no acute no focal consolidation, effusion, edema pneumothorax or other acute thoracic  process.  With regard to urinary sx, UA consistent with urinary source of infection with >50 WBC, POS nitrites, small LES, and many bacterial noted.   No evidence on exam of a cellulitis, abdominal TTP, back pain or CVA TTP to suggest pyelonephritis at this time.   CMP remarkable for K 3.1 without any other significant electrolyte or metabolic derangements. CBC w/ WBC count 21.3 w/o evidence of acute  anemia. INR WNL Initial lactic 3. Mg 1.5. Procal 0.26.   On recheck of VS SpO2 93%. Suspect likely mild bronchitis in setting of sepsis from UTI. No evidence at this time of CHF or bacterial PNA.    Code sepsis initiated. I will admit to medicine service for further evaluation and management.       ____________________________________________   FINAL CLINICAL IMPRESSION(S) / ED DIAGNOSES  Final diagnoses:  Sepsis, due to unspecified organism, unspecified whether acute organ dysfunction present ([place])  Dysuria  Hypokalemia  Acute cystitis with hematuria  Hypomagnesemia  Bronchitis    Medications  lactated ringers infusion (has no administration in time range)  cefTRIAXone (ROCEPHIN) 2 g in sodium chloride 0.9 % 100 mL IVPB (2 g Intravenous New Bag/Given 07/27/21 1542)  lactated ringers bolus 1,000 mL (1,000 mLs Intravenous New Bag/Given 07/27/21 1541)    And  lactated ringers bolus 1,000 mL (1,000 mLs Intravenous New Bag/Given 07/27/21 1541)    And  lactated ringers bolus 400 mL (400 mLs Intravenous New Bag/Given 07/27/21 1541)  magnesium sulfate IVPB 4 g 100 mL (has no administration in time range)  acetaminophen (TYLENOL) tablet 1,000 mg (1,000 mg Oral Given 07/27/21 1228)  potassium chloride SA (KLOR-CON M) CR tablet 40 mEq (40 mEq Oral Given 07/27/21 1544)     ED Discharge Orders     None        Note:  This document was prepared using Dragon voice recognition software and may include unintentional dictation errors.    Lucrezia Starch, MD 07/27/21 [phone]

## 2021-07-27 NOTE — ED Notes (Signed)
First nurse note    brought over from [facility] with fever  body aches   also having loss of bladder control

## 2021-07-27 NOTE — Sepsis Progress Note (Signed)
Clarified with bedside RN that second LA would be drawn after fluid bolus was finished

## 2021-07-27 NOTE — Consult Note (Signed)
CODE SEPSIS - PHARMACY COMMUNICATION  **Broad Spectrum Antibiotics should be administered within 1 hour of Sepsis diagnosis**  Time Code Sepsis Called/Page Received: 1502  Antibiotics Ordered: ceftriaxone  Time of 1st antibiotic administration: 1542  Additional action taken by pharmacy: none  If necessary, Name of Provider/Nurse Contacted: n/a    Michael Boyle PharmD, BCPS 07/27/2021 4:15 PM

## 2021-07-27 NOTE — ED Notes (Signed)
Report messaged to [employer]

## 2021-07-27 NOTE — ED Triage Notes (Signed)
Pt here with a fever and a loss of bladder control since yesterday. Pt states temp was 102 at home. Pt normally has control of his bladder. Pt has burning when he urinates. Pt in no distress in triage.

## 2021-07-27 NOTE — Sepsis Progress Note (Signed)
Sepsis protocol monitored by [employer] 

## 2021-07-27 NOTE — H&P (Signed)
H&P:    Michael Boyle   MGQ:[id number] DOB: 1966/05/15 DOA: 07/27/2021  PCP: Burnard Hawthorne, FNP  Chief Complaint: Chills, fevers, urinary incontinence and dysuria   History of Present Illness:    HPI: Michael Boyle is a 55 y.o. male with a past medical history of super morbid obesity, COPD not on chronic oxygen supplementation, former tobacco dependence, gout, essential hypertension, non-insulin-dependent diabetes mellitus type 2, dyslipidemia, paroxysmal SVT, venous insufficiency.  This patient presents to the emergency department from [facility] for fever, body aches and loss of bladder control.  Also endorses burning with urination.  He states that he started having fevers on Monday as high as 102.5 F, chills and urinary incontinence, as well as dysuria.  Denies any history of kidney stones.  No abdominal pain.  Some nausea.  Denies any flank pain.  Endorses chills.  Denies any cough or shortness of breath.  ED Course: Code sepsis was called in the ER.  The patient has been given sepsis bolus fluids in the emergency department.  Labs show mild hypokalemia and mild hyponatremia.  Lactic acid is elevated and his urinalysis is positive for urinary tract infection.  Urine culture and blood cultures have been sent.  He has been started on Rocephin.  Chest x-ray showed no acute findings.    ROS:   14 point review of systems is negative except for what is mentioned above in the HPI.   Past Medical History:   Past Medical History:  Diagnosis Date   Colon polyps    COPD (chronic obstructive pulmonary disease) ([place])    Diabetes mellitus with complication ([place])    Genetic testing 12/07/2017   Common Cancers panel (47 genes) @ [facility] - No pathogenic mutations detected   GERD (gastroesophageal reflux disease)    Gout    Incomplete left bundle branch block    Incomplete left bundle branch block (LBBB)    a. TTE 4/17: EF of 60-65%, unable to assess wall motion, normal LV diastolic  function parameters, mildly calcifief mitral annulus   Morbid obesity (HCC)    Palpitations    a. felt to be pSVT   Palpitations    Venous insufficiency     Past Surgical History:   Past Surgical History:  Procedure Laterality Date   COLONOSCOPY WITH PROPOFOL N/A 08/14/2015   Procedure: COLONOSCOPY WITH PROPOFOL;  Surgeon: Lollie Sails, MD;  Location: [facility] ENDOSCOPY;  Service: Endoscopy;  Laterality: N/A;   COLONOSCOPY WITH PROPOFOL N/A 11/02/2017   Procedure: COLONOSCOPY WITH PROPOFOL;  Surgeon: Lollie Sails, MD;  Location: [facility] ENDOSCOPY;  Service: Endoscopy;  Laterality: N/A;   COLONOSCOPY WITH PROPOFOL N/A 02/28/2020   Procedure: COLONOSCOPY WITH PROPOFOL;  Surgeon: Jonathon Bellows, MD;  Location: [facility] ENDOSCOPY;  Service: Gastroenterology;  Laterality: N/A;   ESOPHAGOGASTRODUODENOSCOPY (EGD) WITH PROPOFOL N/A 08/14/2015   Procedure: ESOPHAGOGASTRODUODENOSCOPY (EGD) WITH PROPOFOL;  Surgeon: Lollie Sails, MD;  Location: [facility] ENDOSCOPY;  Service: Endoscopy;  Laterality: N/A;   WISDOM TOOTH EXTRACTION      Social History:   Social History   Socioeconomic History   Marital status: Married    Spouse name: Not on file   Number of children: Not on file   Years of education: Not on file   Highest education level: Not on file  Occupational History   Not on file  Tobacco Use   Smoking status: Former    Packs/day: 1.75    Years: 30.00    Pack years: 52.50  Types: Cigarettes    Quit date: 06/13/2012    Years since quitting: 9.1   Smokeless tobacco: Never  Vaping Use   Vaping Use: Former  Substance and Sexual Activity   Alcohol use: No    Alcohol/week: 0.0 standard drinks   Drug use: No   Sexual activity: Not on file  Other Topics Concern   Not on file  Social History Narrative   Not on file   Social Determinants of Health   Financial Resource Strain: Not on file  Food Insecurity: Not on file  Transportation Needs: Not on file  Physical Activity: Not on file   Stress: Not on file  Social Connections: Not on file  Intimate Partner Violence: Not on file    Allergies:   No Known Allergies  Family History:   Family History  Problem Relation Age of Onset   Cancer Sister        lymphoma dx 92s; currently 51   Diabetes Sister    Cirrhosis Sister    Colon cancer Paternal Uncle        dx 81s; deceased 58   Prostate cancer Paternal Uncle        dx 61s; currently 53   Dementia Maternal Aunt 85   Thyroid cancer Neg Hx      Current Medications:   Prior to Admission medications   Medication Sig Start Date End Date Taking? Authorizing Provider  albuterol (VENTOLIN HFA) 108 (90 Base) MCG/ACT inhaler Inhale 2 puffs into the lungs every 6 (six) hours as needed for wheezing or shortness of breath. 09/11/20  Yes Burnard Hawthorne, FNP  allopurinol (ZYLOPRIM) 100 MG tablet Take 1 tablet (100 mg total) by mouth 2 (two) times daily. 03/19/21  Yes Burnard Hawthorne, FNP  aspirin EC 81 MG tablet Take 1 tablet (81 mg total) by mouth daily. 09/11/20  Yes Burnard Hawthorne, FNP  DULoxetine (CYMBALTA) 30 MG capsule Take one 30 mg capsule by mouth once a day for the first week. Then increase to two 30 mg capsules ( total 60mg ) by mouth once daily. Patient taking differently: 30 mg daily. Take one 30 mg capsule by mouth once a day for the first week. Then increase to two 30 mg capsules ( total 60mg ) by mouth once daily. 06/22/21  Yes Burnard Hawthorne, FNP  losartan (COZAAR) 25 MG tablet Take 1 tablet (25 mg total) by mouth daily. 05/07/21  Yes Burnard Hawthorne, FNP  metFORMIN (GLUCOPHAGE XR) 500 MG 24 hr tablet Take 4 tablets (2,000 mg total) by mouth every evening. 04/05/21  Yes Arnett, Yvetta Coder, FNP  pantoprazole (PROTONIX) 40 MG tablet Take 1 tablet (40 mg total) by mouth daily. 09/11/20  Yes Burnard Hawthorne, FNP  potassium chloride SA (KLOR-CON) 20 MEQ tablet TAKE ONE TABLET PER DAY WEDNESDAY & FRIDAYS. TAKE TWO TABLETS PER DAY THE REST OF DAYS OF THE  WEEK. 09/11/20  Yes Arnett, Yvetta Coder, FNP  rosuvastatin (CRESTOR) 10 MG tablet Take 1 tablet (10 mg total) by mouth daily. 09/11/20  Yes Burnard Hawthorne, FNP  torsemide (DEMADEX) 20 MG tablet Take 1 tablet (20 mg total) by mouth 2 (two) times daily. 11/04/20  Yes Arnett, Yvetta Coder, FNP  verapamil (CALAN-SR) 180 MG CR tablet Take 1 tablet (180 mg total) by mouth 2 (two) times daily. Patient taking differently: Take 360 mg by mouth at bedtime. 03/19/21  Yes Burnard Hawthorne, FNP  glucose blood test strip Use as instructed 05/09/19  Jodelle Green, FNP     Physical Exam:   Vitals:   07/27/21 1223 07/27/21 1223 07/27/21 1500  BP:  (!) 119/57 (!) 120/106  Pulse:  88 86  Resp:  20 20  Temp:  (!) 101.3 F (38.5 C)   TempSrc:  Oral   SpO2:  91% 93%  Weight: (!) 182.3 kg    Height: 5\' 11"  (1.803 m)       General:  Appears calm and comfortable and is in NAD Cardiovascular:  RRR, no m/r/g.  Respiratory:   CTA bilaterally with no wheezes/rales/rhonchi.  Normal respiratory effort. Abdomen:  soft, NT, ND, NABS, morbidly obese abdomen Skin:  no rash or induration seen on limited exam Musculoskeletal:  grossly normal tone BUE/BLE, good ROM, no bony abnormality Lower extremity:  No LE edema.  Limited foot exam with no ulcerations.  2+ distal pulses. Psychiatric:  grossly normal mood and affect, speech fluent and appropriate, AOx3 Neurologic:  CN 2-12 grossly intact, moves all extremities in coordinated fashion, sensation intact    Data Review:    Radiological Exams on Admission: Independently reviewed - see discussion in A/P where applicable  DG Chest 2 View  Result Date: 07/27/2021 CLINICAL DATA:  Suspected sepsis EXAM: CHEST - 2 VIEW COMPARISON:  April 2021 FINDINGS: The heart size and mediastinal contours are within normal limits. Atelectasis/scarring at the lung bases. No pleural effusion. No acute osseous abnormality. IMPRESSION: Atelectasis/scarring at the lung bases.  Electronically Signed   By: Macy Mis M.D.   On: 07/27/2021 13:21    EKG: ECG shows A. fib versus sinus rhythm as there is significant artifact in inferior and lateral leads with apparent P waves visible in V4 V5 and V6 and a ventricular rate of 97 with otherwise unremarkable intervals, right axis deviation and no clear evidence of acute ischemia.   Labs on Admission: I have personally reviewed the available labs and imaging studies at the time of the admission.  Pertinent labs on Admission: Sodium 134, potassium 3.1, chloride 97, blood glucose 142, T bili 1.4, acid 3.0, magnesium 1.5, procalcitonin unremarkable, WBC 21     Assessment/Plan:    Sepsis secondary to complicated urinary tract infection: Urinalysis is positive for UTI.  Urine cultures and blood cultures have been sent off.  Continue Rocephin 2 g IV daily has already started in the emergency department.  Meet sepsis criteria with fevers and leukocytosis.  Denies any flank pain.  No history of kidney stones.  Denies any abdominal pain.  Patient received sepsis bolus fluids in the emergency department  Hypomagnesemia: The patient has been ordered magnesium sulfate IV 4 g in the emergency department.  Repeat magnesium level in the morning.  Super morbid obesity: BMI 56 per EMR.  No acute treatment  Non-insulin-dependent diabetes mellitus type 2: Hold home Glucophage XR.  Start sliding scale insulin before meals and at bedtime.  COPD: Continue home Ventolin HFA inhaler as needed.  Not in acute exacerbation.  Not on chronic oxygen supplementation.  Former tobacco dependence: No acute treatment.  Hypertension: Continue home Cozaar  Venous insufficiency: Continue home Demadex  Paroxysmal SVT: continue home verapamil  Dyslipidemia: Continue home Crestor  Depression: Continue home Cymbalta  Gout: Continue home allopurinol    Other information:    Level of Care: MedSurg DVT prophylaxis: Lovenox subcu Code Status:  Full code Consults: None Admission status: Inpatient   Leslee Home DO [facility]   How to contact the [facility] Attending or Consulting provider 7A -  7P or covering provider during after hours [place], for this patient?  Check the care team in [facility] and look for a) attending/consulting TRH provider listed and b) the [facility] team listed Log into [email or web address] and use [place] universal password to access. If you do not have the password, please contact the hospital operator. Locate the [facility] provider you are looking for under [facility] and page to a number that you can be directly reached. If you still have difficulty reaching the provider, please page the [facility] (Director on Call) for the Hospitalists listed on amion for assistance.   07/27/2021, 4:31 PM

## 2021-07-27 NOTE — Progress Notes (Signed)
PHARMACIST - PHYSICIAN COMMUNICATION  CONCERNING:  Enoxaparin (Lovenox) for DVT Prophylaxis    RECOMMENDATION: Patient was prescribed enoxaprin 40mg  q24 hours for VTE prophylaxis.   Filed Weights   07/27/21 1223  Weight: (!) 182.3 kg (402 lb)    Body mass index is 56.07 kg/m.  Estimated Creatinine Clearance: 158.4 mL/min (by C-G formula based on SCr of 0.87 mg/dL).   Based on [place] patient is candidate for enoxaparin 0.5mg /kg TBW SQ every 24 hours based on BMI being >30.   DESCRIPTION: Pharmacy has adjusted enoxaparin dose per [facility] policy.  Patient is now receiving enoxaparin 90 mg every 24 hours    Dorothe Pea, PharmD,BCPS Clinical Pharmacist  07/27/2021 4:46 PM

## 2021-07-28 DIAGNOSIS — I10 Essential (primary) hypertension: Secondary | ICD-10-CM

## 2021-07-28 DIAGNOSIS — Z6841 Body Mass Index (BMI) 40.0 and over, adult: Secondary | ICD-10-CM

## 2021-07-28 DIAGNOSIS — E1169 Type 2 diabetes mellitus with other specified complication: Secondary | ICD-10-CM

## 2021-07-28 DIAGNOSIS — E876 Hypokalemia: Secondary | ICD-10-CM | POA: Diagnosis not present

## 2021-07-28 DIAGNOSIS — I471 Supraventricular tachycardia: Secondary | ICD-10-CM

## 2021-07-28 DIAGNOSIS — A419 Sepsis, unspecified organism: Principal | ICD-10-CM

## 2021-07-28 DIAGNOSIS — N3001 Acute cystitis with hematuria: Secondary | ICD-10-CM | POA: Diagnosis not present

## 2021-07-28 DIAGNOSIS — E785 Hyperlipidemia, unspecified: Secondary | ICD-10-CM

## 2021-07-28 LAB — GLUCOSE, CAPILLARY
Glucose-Capillary: 117 mg/dL — ABNORMAL HIGH (ref 70–99)
Glucose-Capillary: 176 mg/dL — ABNORMAL HIGH (ref 70–99)
Glucose-Capillary: 193 mg/dL — ABNORMAL HIGH (ref 70–99)
Glucose-Capillary: 152 mg/dL — ABNORMAL HIGH (ref 70–99)

## 2021-07-28 LAB — BASIC METABOLIC PANEL
Anion gap: 7 (ref 5–15)
BUN: 9 mg/dL (ref 6–20)
CO2: 28 mmol/L (ref 22–32)
Calcium: 8.1 mg/dL — ABNORMAL LOW (ref 8.9–10.3)
Chloride: 100 mmol/L (ref 98–111)
Creatinine, Ser: 0.84 mg/dL (ref 0.61–1.24)
GFR, Estimated: >60 mL/min (ref >60)
Glucose, Bld: 134 mg/dL — ABNORMAL HIGH (ref 70–99)
Potassium: 3.2 mmol/L — ABNORMAL LOW (ref 3.5–5.1)
Sodium: 135 mmol/L (ref 135–145)

## 2021-07-28 LAB — CBC
HCT: 38.1 % — ABNORMAL LOW (ref 39.0–52.0)
Hemoglobin: 12.2 g/dL — ABNORMAL LOW (ref 13.0–17.0)
MCH: 25.8 pg — ABNORMAL LOW (ref 26.0–34.0)
MCHC: 32 g/dL (ref 30.0–36.0)
MCV: 80.7 fL (ref 80.0–100.0)
Platelets: 234 10*3/uL (ref 150–400)
RBC: 4.72 MIL/uL (ref 4.22–5.81)
RDW: 14.6 % (ref 11.5–15.5)
WBC: 21.2 10*3/uL — ABNORMAL HIGH (ref 4.0–10.5)
nRBC: 0 % (ref 0.0–0.2)

## 2021-07-28 LAB — PROCALCITONIN: Procalcitonin: 0.41 ng/mL

## 2021-07-28 LAB — MAGNESIUM: Magnesium: 2.2 mg/dL (ref 1.7–2.4)

## 2021-07-28 LAB — HIV ANTIBODY (ROUTINE TESTING W REFLEX): HIV Screen 4th Generation wRfx: NONREACTIVE

## 2021-07-28 MED ORDER — LOSARTAN POTASSIUM 25 MG PO TABS
25.0000 mg | ORAL_TABLET | Freq: Every day | ORAL | Status: DC
  Administered 2021-07-30: 09:00:00 25 mg via ORAL
  Filled 2021-07-28 (×2): qty 1

## 2021-07-28 MED ORDER — POTASSIUM CHLORIDE CRYS ER 20 MEQ PO TBCR
40.0000 meq | EXTENDED_RELEASE_TABLET | Freq: Once | ORAL | Status: AC
  Administered 2021-07-28: 08:00:00 40 meq via ORAL
  Filled 2021-07-28: qty 2

## 2021-07-28 NOTE — TOC Initial Note (Signed)
Transition of Care [facility]) - Initial/Assessment Note    Patient Details  Name: Michael Boyle MRN: [id number] Date of Birth: 04-09-1966  [facility] of [facility]) CM/SW Contact:    Pete Pelt, RN Phone Number: 07/28/2021, 4:12 PM  Clinical Narrative:    Patient lives with wife, who can assist him on discharge with care, provides transportation and assists with medication.  At home, patient has a disbilty equipped commode, walker and cpap.  Patient and wife state all are in working order, no concerns.    Patient does not currently have home health or other home services, does not anticipate needs at discharge at this point.               Expected Discharge Plan:  (TBD) Barriers to Discharge: Continued Medical Work up   Patient Goals and CMS Choice     Choice offered to / list presented to : NA  Expected Discharge Plan and Services Expected Discharge Plan:  (TBD)   Discharge Planning Services: CM Consult   Living arrangements for the past 2 months: Single Family Home                                      Prior Living Arrangements/Services Living arrangements for the past 2 months: Single Family Home Lives with:: Self, Spouse Patient language and need for interpreter reviewed:: Yes (No interpreter required) Do you feel safe going back to the place where you live?: Yes      Need for Family Participation in Patient Care: Yes (Comment) Care giver support system in place?: Yes (comment) Current home services: DME Criminal Activity/Legal Involvement Pertinent to Current Situation/Hospitalization: No - Comment as needed  Activities of Daily Living Home Assistive Devices/Equipment: None ADL Screening (condition at time of admission) Patient's cognitive ability adequate to safely complete daily activities?: Yes Is the patient deaf or have difficulty hearing?: No Does the patient have difficulty seeing, even when wearing glasses/contacts?: No Does the patient have  difficulty concentrating, remembering, or making decisions?: No Patient able to express need for assistance with ADLs?: Yes Does the patient have difficulty dressing or bathing?: Yes Independently performs ADLs?: Yes (appropriate for developmental age) Does the patient have difficulty walking or climbing stairs?: No Weakness of Legs: None Weakness of Arms/Hands: None  Permission Sought/Granted Permission sought to share information with : Case Manager Permission granted to share information with : Yes, Verbal Permission Granted              Emotional Assessment Appearance:: Appears stated age Attitude/Demeanor/Rapport: Gracious, Engaged Affect (typically observed): Pleasant, Appropriate Orientation: : Oriented to Self, Oriented to Place, Oriented to  Time, Oriented to Situation Alcohol / Substance Use: Not Applicable Psych Involvement: No (comment)  Admission diagnosis:  Dysuria [R30.0] Hypokalemia [E87.6] Hypomagnesemia [E83.42] Bronchitis [J40] Acute cystitis with hematuria [N30.01] Sepsis ([place]) [A41.9] Sepsis, due to unspecified organism, unspecified whether acute organ dysfunction present [facility]) [A41.9] Patient Active Problem List   Diagnosis Date Noted   Acute cystitis with hematuria    Hypomagnesemia    Sepsis ([place]) 07/27/2021   Upper respiratory tract infection 07/16/2021   B12 deficiency 04/13/2021   Memory change 04/05/2021   Headache 04/05/2021   Chronic back pain 09/11/2020   Atherosclerosis of aorta ([place]) 04/24/2020   Zoster 04/17/2020   Knee pain 08/29/2019   Edema, peripheral 06/18/2019   Embedded toenail 06/18/2019   Fast heart beat 06/18/2019  History of atrial fibrillation 06/18/2019   Need for prophylactic vaccination and inoculation against influenza 06/18/2019   Obstructive apnea 06/18/2019   Diabetes mellitus without complication ([place]) 62/13/0865   Genetic testing 12/07/2017   Colon polyps    Annual physical exam 03/24/2017   Type 2 diabetes  mellitus with hyperlipidemia ([place]) 10/03/2016   Paroxysmal SVT (supraventricular tachycardia) ([place]) 09/23/2015   Hypokalemia 09/23/2015   Chronic thoracic back pain 09/23/2015   Abnormal finding on EKG 09/23/2015   Cardiac dysrhythmia 06/18/2015   Personal history of other diseases of the circulatory system 06/18/2015   Essential hypertension 06/18/2015   COPD (chronic obstructive pulmonary disease) ([place]) 05/11/2015   Acid reflux 05/11/2015   Gout 05/11/2015   HLD (hyperlipidemia) 05/11/2015   Morbid obesity with BMI of 50.0-59.9, adult ([place]) 05/11/2015   Left leg swelling 05/11/2015   PCP:  Burnard Hawthorne, FNP Pharmacy:   [facility], [address] [place] [address] Phone: [phone] Fax: [phone]     Social Determinants of Health (SDOH) Interventions    Readmission Risk Interventions No flowsheet data found.

## 2021-07-28 NOTE — Progress Notes (Addendum)
Patient ID: Michael Boyle, male   DOB: 13-Sep-1965, 55 y.o.   MRN: [id number] [facility] PROGRESS NOTE  LAVANTE TOSO OYD:[id number] DOB: Jan 18, 1966 DOA: 07/27/2021 PCP: Burnard Hawthorne, FNP  HPI/Subjective: Patient feeling little bit better than yesterday.  Still urinating quite a bit.  Still burning on urination.  Had fever coming in and still has elevated white blood cell count today after antibiotics.  Admitted with sepsis.  Objective: Vitals:   07/28/21 0438 07/28/21 0740  BP: 138/66 (!) 122/57  Pulse: 80 77  Resp: 18 19  Temp: 99.5 F (37.5 C) 98.6 F (37 C)  SpO2: 96% 94%    Intake/Output Summary (Last 24 hours) at 07/28/2021 1356 Last data filed at 07/28/2021 1009 Gross per 24 hour  Intake 0 ml  Output 625 ml  Net -625 ml   Filed Weights   07/27/21 1223  Weight: (!) 182.3 kg    ROS: Review of Systems  Respiratory:  Negative for cough and shortness of breath.   Cardiovascular:  Negative for chest pain.  Gastrointestinal:  Negative for abdominal pain, nausea and vomiting.  Exam: Physical Exam HENT:     Head: Normocephalic.     Mouth/Throat:     Pharynx: No oropharyngeal exudate.  Eyes:     General: Lids are normal.     Conjunctiva/sclera: Conjunctivae normal.  Cardiovascular:     Rate and Rhythm: Normal rate and regular rhythm.     Heart sounds: Normal heart sounds, S1 normal and S2 normal.  Pulmonary:     Breath sounds: Examination of the right-lower field reveals decreased breath sounds. Examination of the left-lower field reveals decreased breath sounds. Decreased breath sounds present. No wheezing, rhonchi or rales.  Abdominal:     Palpations: Abdomen is soft.     Tenderness: There is no abdominal tenderness.  Musculoskeletal:     Right lower leg: Swelling present.     Left lower leg: Swelling present.  Skin:    General: Skin is warm.     Findings: No rash.  Neurological:     Mental Status: He is alert and oriented to person, place,  and time.      Scheduled Meds:  allopurinol  100 mg Oral BID   aspirin EC  81 mg Oral Daily   DULoxetine  30 mg Oral Daily   enoxaparin (LOVENOX) injection  90 mg Subcutaneous QHS   influenza vac split quadrivalent PF  0.5 mL Intramuscular Tomorrow-1000   insulin aspart  0-20 Units Subcutaneous TID WC   insulin aspart  0-5 Units Subcutaneous QHS   [START ON 07/29/2021] losartan  25 mg Oral Daily   pantoprazole  40 mg Oral Daily   rosuvastatin  10 mg Oral QHS   torsemide  20 mg Oral BID   verapamil  180 mg Oral BID   Continuous Infusions:  cefTRIAXone (ROCEPHIN)  IV Stopped (07/27/21 1618)    Assessment/Plan:  Clinical sepsis, present on admission.  Patient with leukocytosis of 21.3 on presentation and still the same today of 21.2.  Patient had fever of 101.3 on presentation.  Patient also had hypotension yesterday with a BP of 84/50.  Patient had tachypnea with a respiratory rate of 38 yesterday.  Urinalysis positive consistent with acute cystitis with hematuria.  Blood cultures and urine culture still pending at this time.  Would like to see white blood cell count trending better prior to disposition. Hypomagnesemia and hypokalemia.  Magnesium replaced into the normal range today after a 4  g of IV magnesium yesterday.  Continue oral replacement of magnesium today. Morbid obesity with a BMI of 56.07 Type 2 diabetes mellitus with hyperlipidemia on Crestor.  Sliding scale for now.  Holding Glucophage. Essential hypertension and edema.  On torsemide.  Hold losartan today and restart tomorrow. Paroxysmal SVT on verapamil Depression on Cymbalta Gout on allopurinol        Code Status:     Code Status Orders  (From admission, onward)           Start     Ordered   07/27/21 1624  Full code  Continuous        07/27/21 1624           Code Status History     This patient has a current code status but no historical code status.      Family Communication: Wife at the  bedside Disposition Plan: Status is: Inpatient  Antibiotics: Rocephin  Case discussed with nursing staff, utilization review  [employer]  [facility]

## 2021-07-29 DIAGNOSIS — E876 Hypokalemia: Secondary | ICD-10-CM | POA: Diagnosis not present

## 2021-07-29 DIAGNOSIS — A4151 Sepsis due to Escherichia coli [E. coli]: Secondary | ICD-10-CM | POA: Diagnosis not present

## 2021-07-29 DIAGNOSIS — N3001 Acute cystitis with hematuria: Secondary | ICD-10-CM | POA: Diagnosis not present

## 2021-07-29 LAB — BASIC METABOLIC PANEL
Anion gap: 10 (ref 5–15)
BUN: 10 mg/dL (ref 6–20)
CO2: 27 mmol/L (ref 22–32)
Calcium: 8 mg/dL — ABNORMAL LOW (ref 8.9–10.3)
Chloride: 93 mmol/L — ABNORMAL LOW (ref 98–111)
Creatinine, Ser: 0.79 mg/dL (ref 0.61–1.24)
GFR, Estimated: >60 mL/min (ref >60)
Glucose, Bld: 134 mg/dL — ABNORMAL HIGH (ref 70–99)
Potassium: 2.8 mmol/L — ABNORMAL LOW (ref 3.5–5.1)
Sodium: 130 mmol/L — ABNORMAL LOW (ref 135–145)

## 2021-07-29 LAB — GLUCOSE, CAPILLARY
Glucose-Capillary: 140 mg/dL — ABNORMAL HIGH (ref 70–99)
Glucose-Capillary: 145 mg/dL — ABNORMAL HIGH (ref 70–99)
Glucose-Capillary: 150 mg/dL — ABNORMAL HIGH (ref 70–99)
Glucose-Capillary: 127 mg/dL — ABNORMAL HIGH (ref 70–99)

## 2021-07-29 LAB — CBC WITH DIFFERENTIAL/PLATELET
Abs Immature Granulocytes: 0.09 10*3/uL — ABNORMAL HIGH (ref 0.00–0.07)
Basophils Absolute: 0.1 10*3/uL (ref 0.0–0.1)
Basophils Relative: 0 %
Eosinophils Absolute: 0 10*3/uL (ref 0.0–0.5)
Eosinophils Relative: 0 %
HCT: 39.3 % (ref 39.0–52.0)
Hemoglobin: 12.7 g/dL — ABNORMAL LOW (ref 13.0–17.0)
Immature Granulocytes: 1 %
Lymphocytes Relative: 7 %
Lymphs Abs: 1.1 10*3/uL (ref 0.7–4.0)
MCH: 25.6 pg — ABNORMAL LOW (ref 26.0–34.0)
MCHC: 32.3 g/dL (ref 30.0–36.0)
MCV: 79.1 fL — ABNORMAL LOW (ref 80.0–100.0)
Monocytes Absolute: 1.3 10*3/uL — ABNORMAL HIGH (ref 0.1–1.0)
Monocytes Relative: 9 %
Neutro Abs: 12.5 10*3/uL — ABNORMAL HIGH (ref 1.7–7.7)
Neutrophils Relative %: 83 %
Platelets: 239 10*3/uL (ref 150–400)
RBC: 4.97 MIL/uL (ref 4.22–5.81)
RDW: 14.5 % (ref 11.5–15.5)
WBC: 15 10*3/uL — ABNORMAL HIGH (ref 4.0–10.5)
nRBC: 0 % (ref 0.0–0.2)

## 2021-07-29 LAB — PROCALCITONIN: Procalcitonin: 0.45 ng/mL

## 2021-07-29 MED ORDER — POTASSIUM CHLORIDE CRYS ER 20 MEQ PO TBCR
40.0000 meq | EXTENDED_RELEASE_TABLET | Freq: Once | ORAL | Status: AC
  Administered 2021-07-29: 13:00:00 40 meq via ORAL
  Filled 2021-07-29: qty 2

## 2021-07-29 MED ORDER — POTASSIUM CHLORIDE CRYS ER 20 MEQ PO TBCR
40.0000 meq | EXTENDED_RELEASE_TABLET | Freq: Once | ORAL | Status: AC
  Administered 2021-07-29: 10:00:00 40 meq via ORAL
  Filled 2021-07-29: qty 2

## 2021-07-29 MED ORDER — POTASSIUM CHLORIDE CRYS ER 20 MEQ PO TBCR
40.0000 meq | EXTENDED_RELEASE_TABLET | Freq: Once | ORAL | Status: AC
  Administered 2021-07-29: 22:00:00 40 meq via ORAL
  Filled 2021-07-29: qty 2

## 2021-07-29 NOTE — Progress Notes (Signed)
Patient ID: Michael Boyle, male   DOB: 1965/09/29, 55 y.o.   MRN: [id number] [facility] PROGRESS NOTE  Michael Boyle IRC:[id number] DOB: August 24, 1965 DOA: 07/27/2021 PCP: Burnard Hawthorne, FNP  HPI/Subjective: Patient had a fever of 100.9 this morning.  He feels okay.  Still urinating numerous times.  Admitted with sepsis.  Objective: Vitals:   07/29/21 0854 07/29/21 1227  BP: 101/63 115/81  Pulse: 67 92  Resp: 18 18  Temp: 98.5 F (36.9 C) 98.4 F (36.9 C)  SpO2: 92% 94%    Intake/Output Summary (Last 24 hours) at 07/29/2021 1330 Last data filed at 07/29/2021 1059 Gross per 24 hour  Intake 532.37 ml  Output --  Net 532.37 ml   Filed Weights   07/27/21 1223  Weight: (!) 182.3 kg    ROS: Review of Systems  Respiratory:  Negative for shortness of breath.   Cardiovascular:  Negative for chest pain.  Gastrointestinal:  Negative for abdominal pain, nausea and vomiting.  Exam: Physical Exam HENT:     Head: Normocephalic.     Mouth/Throat:     Pharynx: No oropharyngeal exudate.  Eyes:     General: Lids are normal.     Conjunctiva/sclera: Conjunctivae normal.  Cardiovascular:     Rate and Rhythm: Normal rate and regular rhythm.     Heart sounds: Normal heart sounds, S1 normal and S2 normal.  Pulmonary:     Breath sounds: No decreased breath sounds, wheezing, rhonchi or rales.  Abdominal:     Palpations: Abdomen is soft.     Tenderness: There is no abdominal tenderness.  Musculoskeletal:     Right lower leg: Swelling present.     Left lower leg: Swelling present.  Skin:    General: Skin is warm.     Findings: No rash.  Neurological:     Mental Status: He is alert and oriented to person, place, and time.      Scheduled Meds:  allopurinol  100 mg Oral BID   aspirin EC  81 mg Oral Daily   DULoxetine  30 mg Oral Daily   enoxaparin (LOVENOX) injection  90 mg Subcutaneous QHS   insulin aspart  0-20 Units Subcutaneous TID WC   insulin aspart  0-5 Units  Subcutaneous QHS   losartan  25 mg Oral Daily   pantoprazole  40 mg Oral Daily   rosuvastatin  10 mg Oral QHS   verapamil  180 mg Oral BID   Continuous Infusions:  cefTRIAXone (ROCEPHIN)  IV 2 g (07/28/21 1541)    Assessment/Plan:  Clinical sepsis, present on admission.  Today had a fever of 100.9.  Initially had leukocytosis with a white blood cell count of 21.3 and has come down to 15 today.  Patient had hypotension, tachypnea on presentation also.  Acute cystitis with hematuria and E. coli growing out of the urine culture.  Sensitivities still pending.  Blood cultures negative so far.  With fever today we will watch another day in the hospital. Hypokalemia.  Potassium 2.8 today.  Hold torsemide today and replace potassium twice today so far.  We will give another dose this evening. Hypomagnesemia replaced into the normal range Hyponatremia today.  Sodium 130.  Hold torsemide today. Morbid obesity with a BMI of 56.07 Type 2 diabetes mellitus with hyperlipidemia on Crestor.  On sliding scale insulin for now.  Hold Glucophage while inpatient. Essential hypertension and edema.  Hold torsemide right now.  Continue verapamil.  Can give losartan if blood pressure  comes up. Paroxysmal SVT on verapamil Depression on Cymbalta Gout on allopurinol        Code Status:     Code Status Orders  (From admission, onward)           Start     Ordered   07/27/21 1624  Full code  Continuous        07/27/21 1624           Code Status History     This patient has a current code status but no historical code status.      Family Communication: Wife at the bedside Disposition Plan: Status is: Inpatient  Antibiotics: Rocephin  Shandy Vi [employer]  [facility]

## 2021-07-30 DIAGNOSIS — E876 Hypokalemia: Secondary | ICD-10-CM | POA: Diagnosis not present

## 2021-07-30 DIAGNOSIS — A4151 Sepsis due to Escherichia coli [E. coli]: Secondary | ICD-10-CM | POA: Diagnosis not present

## 2021-07-30 LAB — URINE CULTURE: Culture: >=100000 — AB

## 2021-07-30 LAB — CBC
HCT: 39.2 % (ref 39.0–52.0)
Hemoglobin: 12.8 g/dL — ABNORMAL LOW (ref 13.0–17.0)
MCH: 25.9 pg — ABNORMAL LOW (ref 26.0–34.0)
MCHC: 32.7 g/dL (ref 30.0–36.0)
MCV: 79.2 fL — ABNORMAL LOW (ref 80.0–100.0)
Platelets: 248 10*3/uL (ref 150–400)
RBC: 4.95 MIL/uL (ref 4.22–5.81)
RDW: 14.4 % (ref 11.5–15.5)
WBC: 8.2 10*3/uL (ref 4.0–10.5)
nRBC: 0 % (ref 0.0–0.2)

## 2021-07-30 LAB — BASIC METABOLIC PANEL
Anion gap: 8 (ref 5–15)
BUN: 10 mg/dL (ref 6–20)
CO2: 25 mmol/L (ref 22–32)
Calcium: 8.2 mg/dL — ABNORMAL LOW (ref 8.9–10.3)
Chloride: 98 mmol/L (ref 98–111)
Creatinine, Ser: 0.95 mg/dL (ref 0.61–1.24)
GFR, Estimated: >60 mL/min (ref >60)
Glucose, Bld: 131 mg/dL — ABNORMAL HIGH (ref 70–99)
Potassium: 3.3 mmol/L — ABNORMAL LOW (ref 3.5–5.1)
Sodium: 131 mmol/L — ABNORMAL LOW (ref 135–145)

## 2021-07-30 LAB — GLUCOSE, CAPILLARY: Glucose-Capillary: 141 mg/dL — ABNORMAL HIGH (ref 70–99)

## 2021-07-30 LAB — MAGNESIUM: Magnesium: 2 mg/dL (ref 1.7–2.4)

## 2021-07-30 MED ORDER — TORSEMIDE 20 MG PO TABS: 20.0000 mg | ORAL_TABLET | Freq: Every day | ORAL | 3 refills | Status: DC

## 2021-07-30 MED ORDER — SODIUM CHLORIDE 0.9 % IV SOLN
2.0000 g | INTRAVENOUS | Status: DC
  Filled 2021-07-30: qty 20

## 2021-07-30 MED ORDER — POTASSIUM CHLORIDE CRYS ER 20 MEQ PO TBCR
40.0000 meq | EXTENDED_RELEASE_TABLET | Freq: Once | ORAL | Status: AC
  Administered 2021-07-30: 09:00:00 40 meq via ORAL
  Filled 2021-07-30: qty 2

## 2021-07-30 MED ORDER — CEPHALEXIN 500 MG PO CAPS
500.0000 mg | ORAL_CAPSULE | Freq: Three times a day (TID) | ORAL | 0 refills | Status: AC
Start: 2021-07-31 — End: 2021-08-04

## 2021-07-30 NOTE — Progress Notes (Signed)
Patient ID: Michael Boyle, male   DOB: 06-21-1966, 55 y.o.   MRN: [id number]  Called patient that he did not get the Rocephin dose prior to leaving so he needs to start the antibiotic now.  Dr Loletha Grayer

## 2021-07-30 NOTE — Progress Notes (Signed)
Notified Michael Boyle to take po antibiotics now instead of waiting until the morning.

## 2021-07-30 NOTE — Discharge Summary (Signed)
[facility] NAME: Michael Boyle    MR#:  [id number]  DATE OF BIRTH:  Jan 04, 1966  DATE OF ADMISSION:  07/27/2021 ADMITTING PHYSICIAN: Leslee Home, DO  DATE OF DISCHARGE: 07/30/2021 10:59 AM  PRIMARY CARE PHYSICIAN: Burnard Hawthorne, FNP    ADMISSION DIAGNOSIS:  Dysuria [R30.0] Hypokalemia [E87.6] Hypomagnesemia [E83.42] Bronchitis [J40] Acute cystitis with hematuria [N30.01] Sepsis ([place]) [A41.9] Sepsis, due to unspecified organism, unspecified whether acute organ dysfunction present ([place]) [A41.9]  DISCHARGE DIAGNOSIS:  Principal Problem:   Sepsis ([place]) Active Problems:   Morbid obesity with BMI of 50.0-59.9, adult (HCC)   Paroxysmal SVT (supraventricular tachycardia) (HCC)   Hypokalemia   Type 2 diabetes mellitus with hyperlipidemia ([place])   Essential hypertension   Acute cystitis with hematuria   Hypomagnesemia   SECONDARY DIAGNOSIS:   Past Medical History:  Diagnosis Date   Colon polyps    COPD (chronic obstructive pulmonary disease) (HCC)    Diabetes mellitus with complication (HCC)    Genetic testing 12/07/2017   Common Cancers panel (47 genes) @ [facility] - No pathogenic mutations detected   GERD (gastroesophageal reflux disease)    Gout    Incomplete left bundle branch block    Incomplete left bundle branch block (LBBB)    a. TTE 4/17: EF of 60-65%, unable to assess wall motion, normal LV diastolic function parameters, mildly calcifief mitral annulus   Morbid obesity (HCC)    Palpitations    a. felt to be pSVT   Palpitations    Venous insufficiency     HOSPITAL COURSE:   Clinical sepsis, present on admission.  E. coli growing out of urine culture.  Patient had fever leukocytosis hypotension tachypnea on presentation.  Patient was given Rocephin while here in the hospital and switch over to Keflex upon disposition. Hypokalemia.  Potassium replaced and torsemide held a couple days during  the hospital course.  Can go back on torsemide but will need potassium supplementation along with that. Hypomagnesemia replaced into the normal range. Hyponatremia.  Sodium 131 upon discharge. Morbid obesity with a BMI of 56.07 Type 2 diabetes mellitus with hyperlipidemia on Crestor.  Can go back on Glucophage as outpatient Essential hypertension edema.  Can go back on torsemide as outpatient.  Continue verapamil and losartan Paroxysmal SVT on verapamil, Depression on Cymbalta Gout on allopurinol  DISCHARGE CONDITIONS:   Satisfactory  CONSULTS OBTAINED:  None  DRUG ALLERGIES:  No Known Allergies  DISCHARGE MEDICATIONS:   Allergies as of 07/30/2021   No Known Allergies      Medication List     TAKE these medications    albuterol 108 (90 Base) MCG/ACT inhaler Commonly known as: VENTOLIN HFA Inhale 2 puffs into the lungs every 6 (six) hours as needed for wheezing or shortness of breath.   allopurinol 100 MG tablet Commonly known as: ZYLOPRIM Take 1 tablet (100 mg total) by mouth 2 (two) times daily.   aspirin EC 81 MG tablet Take 1 tablet (81 mg total) by mouth daily.   cephALEXin 500 MG capsule Commonly known as: KEFLEX Take 1 capsule (500 mg total) by mouth 3 (three) times daily for 4 days. Start taking on: July 31, 2021   DULoxetine 30 MG capsule Commonly known as: CYMBALTA Take one 30 mg capsule by mouth once a day for the first week. Then increase to two 30 mg capsules ( total 60mg ) by mouth once daily. What changed: See the new instructions.  glucose blood test strip Use as instructed   losartan 25 MG tablet Commonly known as: COZAAR Take 1 tablet (25 mg total) by mouth daily.   metFORMIN 500 MG 24 hr tablet Commonly known as: Glucophage XR Take 4 tablets (2,000 mg total) by mouth every evening.   pantoprazole 40 MG tablet Commonly known as: PROTONIX Take 1 tablet (40 mg total) by mouth daily.   potassium chloride SA 20 MEQ tablet Commonly  known as: KLOR-CON M TAKE ONE TABLET PER DAY WEDNESDAY & FRIDAYS. TAKE TWO TABLETS PER DAY THE REST OF DAYS OF THE WEEK.   rosuvastatin 10 MG tablet Commonly known as: Crestor Take 1 tablet (10 mg total) by mouth daily.   torsemide 20 MG tablet Commonly known as: DEMADEX Take 1 tablet (20 mg total) by mouth daily. What changed: when to take this   verapamil 180 MG CR tablet Commonly known as: CALAN-SR Take 1 tablet (180 mg total) by mouth 2 (two) times daily. What changed:  how much to take when to take this         DISCHARGE INSTRUCTIONS:   Follow-up PMD 5 days  If you experience worsening of your admission symptoms, develop shortness of breath, life threatening emergency, suicidal or homicidal thoughts you must seek medical attention immediately by calling 911 or calling your MD immediately  if symptoms less severe.  You Must read complete instructions/literature along with all the possible adverse reactions/side effects for all the Medicines you take and that have been prescribed to you. Take any new Medicines after you have completely understood and accept all the possible adverse reactions/side effects.   Please note  You were cared for by a hospitalist during your hospital stay. If you have any questions about your discharge medications or the care you received while you were in the hospital after you are discharged, you can call the unit and asked to speak with the hospitalist on call if the hospitalist that took care of you is not available. Once you are discharged, your primary care physician will handle any further medical issues. Please note that NO REFILLS for any discharge medications will be authorized once you are discharged, as it is imperative that you return to your primary care physician (or establish a relationship with a primary care physician if you do not have one) for your aftercare needs so that they can reassess your need for medications and monitor your  lab values.    Today   CHIEF COMPLAINT:   Chief Complaint  Patient presents with   Fever    HISTORY OF PRESENT ILLNESS:  Michael Boyle  is a 55 y.o. male came in with fever and urinary frequency   VITAL SIGNS:  Blood pressure 112/79, pulse 86, temperature 98.8 F (37.1 C), temperature source Oral, resp. rate 18, height 5\' 11"  (1.803 m), weight (!) 182.3 kg, SpO2 93 %.  I/O:   Intake/Output Summary (Last 24 hours) at 07/30/2021 1706 Last data filed at 07/30/2021 0900 Gross per 24 hour  Intake 267.69 ml  Output --  Net 267.69 ml    PHYSICAL EXAMINATION:  GENERAL:  55 y.o.-year-old patient lying in the bed with no acute distress.  EYES: Pupils equal, round, reactive to light and accommodation. No scleral icterus. HEENT: Head atraumatic, normocephalic. Oropharynx and nasopharynx clear.  LUNGS: Normal breath sounds bilaterally, no wheezing, rales,rhonchi or crepitation. No use of accessory muscles of respiration.  CARDIOVASCULAR: S1, S2 normal. No murmurs, rubs, or gallops.  ABDOMEN: Soft, non-tender,  non-distended.  EXTREMITIES: Trace pedal edema.  NEUROLOGIC: Cranial nerves II through XII are intact. Muscle strength 5/5 in all extremities. Sensation intact. Gait not checked.  PSYCHIATRIC: The patient is alert and oriented x 3.  SKIN: No obvious rash, lesion, or ulcer.   DATA REVIEW:   CBC Recent Labs  Lab 07/30/21 0626  WBC 8.2  HGB 12.8*  HCT 39.2  PLT 248    Chemistries  Recent Labs  Lab 07/27/21 1226 07/27/21 1247 07/30/21 0626  NA 134*   < > 131*  K 3.1*   < > 3.3*  CL 97*   < > 98  CO2 28   < > 25  GLUCOSE 142*   < > 131*  BUN 9   < > 10  CREATININE 0.87   < > 0.95  CALCIUM 8.6*   < > 8.2*  MG  --    < > 2.0  AST 20  --   --   ALT 18  --   --   ALKPHOS 49  --   --   BILITOT 1.4*  --   --    < > = values in this interval not displayed.     Microbiology Results  Results for orders placed or performed during the hospital encounter of  07/27/21  Culture, blood (Routine x 2)     Status: None (Preliminary result)   Collection Time: 07/27/21 12:26 PM   Specimen: BLOOD  Result Value Ref Range Status   Specimen Description BLOOD BLOOD LEFT HAND  Final   Special Requests   Final    BOTTLES DRAWN AEROBIC AND ANAEROBIC Blood Culture adequate volume   Culture   Final    NO GROWTH 3 DAYS Performed at [facility], [address], [place], [place] [address]    Report Status PENDING  Incomplete  Resp Panel by RT-PCR (Flu A&B, Covid) Nasopharyngeal Swab     Status: None   Collection Time: 07/27/21 12:26 PM   Specimen: Nasopharyngeal Swab; Nasopharyngeal(NP) swabs in vial transport medium  Result Value Ref Range Status   SARS Coronavirus 2 by RT PCR NEGATIVE NEGATIVE Final    Comment: (NOTE) SARS-CoV-2 target nucleic acids are NOT DETECTED.  The SARS-CoV-2 RNA is generally detectable in upper respiratory specimens during the acute phase of infection. The lowest concentration of SARS-CoV-2 viral copies this assay can detect is 138 copies/mL. A negative result does not preclude SARS-Cov-2 infection and should not be used as the sole basis for treatment or other patient management decisions. A negative result may occur with  improper specimen collection/handling, submission of specimen other than nasopharyngeal swab, presence of viral mutation(s) within the areas targeted by this assay, and inadequate number of viral copies(<138 copies/mL). A negative result must be combined with clinical observations, patient history, and epidemiological information. The expected result is Negative.  Fact Sheet for Patients:  EntrepreneurPulse.com.au  Fact Sheet for Healthcare Providers:  IncredibleEmployment.be  This test is no t yet approved or cleared by the [place] FDA and  has been authorized for detection and/or diagnosis of SARS-CoV-2 by FDA under an Emergency Use Authorization  (EUA). This EUA will remain  in effect (meaning this test can be used) for the duration of the COVID-19 declaration under Section 564(b)(1) of the Act, 21 U.S.C.section 360bbb-3(b)(1), unless the authorization is terminated  or revoked sooner.       Influenza A by PCR NEGATIVE NEGATIVE Final   Influenza B by PCR NEGATIVE NEGATIVE Final  Comment: (NOTE) The Xpert Xpress SARS-CoV-2/FLU/RSV plus assay is intended as an aid in the diagnosis of influenza from Nasopharyngeal swab specimens and should not be used as a sole basis for treatment. Nasal washings and aspirates are unacceptable for Xpert Xpress SARS-CoV-2/FLU/RSV testing.  Fact Sheet for Patients: EntrepreneurPulse.com.au  Fact Sheet for Healthcare Providers: IncredibleEmployment.be  This test is not yet approved or cleared by the [place] FDA and has been authorized for detection and/or diagnosis of SARS-CoV-2 by FDA under an Emergency Use Authorization (EUA). This EUA will remain in effect (meaning this test can be used) for the duration of the COVID-19 declaration under Section 564(b)(1) of the Act, 21 U.S.C. section 360bbb-3(b)(1), unless the authorization is terminated or revoked.  Performed at [facility], [address], [place], [place] [address]   Urine Culture     Status: Abnormal   Collection Time: 07/27/21 12:26 PM   Specimen: Urine, Clean Catch  Result Value Ref Range Status   Specimen Description   Final    URINE, CLEAN CATCH Performed at [facility], [address] [place], [place] [address]    Special Requests   Final    NONE Performed at [facility], [address] [place], [place] [address]    Culture >=100,000 COLONIES/mL ESCHERICHIA COLI (A)  Final   Report Status 07/30/2021 FINAL  Final   Organism ID, Bacteria ESCHERICHIA COLI (A)  Final      Susceptibility   Escherichia coli - MIC*    AMPICILLIN 4 SENSITIVE  Sensitive     CEFAZOLIN <=4 SENSITIVE Sensitive     CEFEPIME <=0.12 SENSITIVE Sensitive     CEFTRIAXONE <=0.25 SENSITIVE Sensitive     CIPROFLOXACIN <=0.25 SENSITIVE Sensitive     GENTAMICIN <=1 SENSITIVE Sensitive     IMIPENEM <=0.25 SENSITIVE Sensitive     NITROFURANTOIN <=16 SENSITIVE Sensitive     TRIMETH/SULFA <=20 SENSITIVE Sensitive     AMPICILLIN/SULBACTAM <=2 SENSITIVE Sensitive     PIP/TAZO <=4 SENSITIVE Sensitive     * >=100,000 COLONIES/mL ESCHERICHIA COLI  Culture, blood (Routine x 2)     Status: None (Preliminary result)   Collection Time: 07/27/21 12:50 PM   Specimen: BLOOD  Result Value Ref Range Status   Specimen Description BLOOD RIGHT AC  Final   Special Requests   Final    BOTTLES DRAWN AEROBIC AND ANAEROBIC Blood Culture results may not be optimal due to an excessive volume of blood received in culture bottles   Culture   Final    NO GROWTH 3 DAYS Performed at [facility], [address] [place], [place] [address]    Report Status PENDING  Incomplete       Management plans discussed with the patient, family and they are in agreement.  CODE STATUS:  Code Status History     Date Active Date Inactive Code Status Order ID Comments User Context   07/27/2021 1624 07/30/2021 1628 Full Code [id number]  Leslee Home, DO ED       TOTAL TIME TAKING CARE OF THIS PATIENT: 35 minutes.    Loletha Grayer M.D on 07/30/2021 at 5:06 PM   [facility]  CC: Primary care physician; Burnard Hawthorne, FNP

## 2021-08-01 LAB — CULTURE, BLOOD (ROUTINE X 2)
Culture: NO GROWTH
Culture: NO GROWTH
Special Requests: ADEQUATE

## 2021-08-16 DIAGNOSIS — R0989 Other specified symptoms and signs involving the circulatory and respiratory systems: Secondary | ICD-10-CM | POA: Diagnosis not present

## 2021-08-16 DIAGNOSIS — Z87440 Personal history of urinary (tract) infections: Secondary | ICD-10-CM | POA: Diagnosis not present

## 2021-08-16 DIAGNOSIS — R918 Other nonspecific abnormal finding of lung field: Secondary | ICD-10-CM | POA: Diagnosis not present

## 2021-08-16 DIAGNOSIS — Z8701 Personal history of pneumonia (recurrent): Secondary | ICD-10-CM | POA: Diagnosis not present

## 2021-08-16 DIAGNOSIS — Z8616 Personal history of COVID-19: Secondary | ICD-10-CM

## 2021-08-16 DIAGNOSIS — J9811 Atelectasis: Secondary | ICD-10-CM | POA: Diagnosis not present

## 2021-08-16 DIAGNOSIS — R509 Fever, unspecified: Secondary | ICD-10-CM | POA: Diagnosis not present

## 2021-08-16 DIAGNOSIS — Z03818 Encounter for observation for suspected exposure to other biological agents ruled out: Secondary | ICD-10-CM | POA: Diagnosis not present

## 2021-08-16 DIAGNOSIS — U071 COVID-19: Secondary | ICD-10-CM | POA: Diagnosis not present

## 2021-08-16 HISTORY — DX: Personal history of COVID-19: Z86.16

## 2021-08-18 ENCOUNTER — Ambulatory Visit: Payer: 59 | Admitting: Family

## 2021-08-19 ENCOUNTER — Ambulatory Visit: Payer: 59

## 2021-08-24 ENCOUNTER — Other Ambulatory Visit: Payer: Self-pay

## 2021-08-24 ENCOUNTER — Ambulatory Visit (INDEPENDENT_AMBULATORY_CARE_PROVIDER_SITE_OTHER): Payer: 59

## 2021-08-24 DIAGNOSIS — E538 Deficiency of other specified B group vitamins: Secondary | ICD-10-CM

## 2021-08-24 MED ORDER — CYANOCOBALAMIN 1000 MCG/ML IJ SOLN
1000.0000 ug | Freq: Once | INTRAMUSCULAR | Status: AC
  Administered 2021-08-24: 1000 ug via INTRAMUSCULAR

## 2021-08-24 NOTE — Progress Notes (Signed)
Patient came in today for B-12 injection given in left deltoid IM. Patient tolerated will with no signs of distress.

## 2021-08-27 ENCOUNTER — Other Ambulatory Visit: Payer: Self-pay | Admitting: Family

## 2021-08-27 DIAGNOSIS — E119 Type 2 diabetes mellitus without complications: Secondary | ICD-10-CM

## 2021-09-17 ENCOUNTER — Ambulatory Visit (INDEPENDENT_AMBULATORY_CARE_PROVIDER_SITE_OTHER): Payer: 59 | Admitting: Family

## 2021-09-17 ENCOUNTER — Other Ambulatory Visit: Payer: Self-pay

## 2021-09-17 ENCOUNTER — Encounter: Payer: Self-pay | Admitting: Family

## 2021-09-17 VITALS — BP 122/70 | HR 57 | Temp 97.6°F | Ht 71.0 in | Wt 399.0 lb

## 2021-09-17 DIAGNOSIS — Z23 Encounter for immunization: Secondary | ICD-10-CM | POA: Diagnosis not present

## 2021-09-17 DIAGNOSIS — E538 Deficiency of other specified B group vitamins: Secondary | ICD-10-CM | POA: Diagnosis not present

## 2021-09-17 DIAGNOSIS — E119 Type 2 diabetes mellitus without complications: Secondary | ICD-10-CM

## 2021-09-17 DIAGNOSIS — J449 Chronic obstructive pulmonary disease, unspecified: Secondary | ICD-10-CM

## 2021-09-17 DIAGNOSIS — E1169 Type 2 diabetes mellitus with other specified complication: Secondary | ICD-10-CM

## 2021-09-17 DIAGNOSIS — E785 Hyperlipidemia, unspecified: Secondary | ICD-10-CM

## 2021-09-17 DIAGNOSIS — Z1211 Encounter for screening for malignant neoplasm of colon: Secondary | ICD-10-CM | POA: Diagnosis not present

## 2021-09-17 DIAGNOSIS — R413 Other amnesia: Secondary | ICD-10-CM

## 2021-09-17 DIAGNOSIS — R519 Headache, unspecified: Secondary | ICD-10-CM | POA: Diagnosis not present

## 2021-09-17 DIAGNOSIS — G8929 Other chronic pain: Secondary | ICD-10-CM

## 2021-09-17 DIAGNOSIS — M549 Dorsalgia, unspecified: Secondary | ICD-10-CM

## 2021-09-17 LAB — POCT GLYCOSYLATED HEMOGLOBIN (HGB A1C): Hemoglobin A1C: 6.8 % — AB (ref 4.0–5.6)

## 2021-09-17 LAB — B12 AND FOLATE PANEL
Folate: 10.7 ng/mL (ref >5.9)
Vitamin B-12: 369 pg/mL (ref 211–911)

## 2021-09-17 NOTE — Assessment & Plan Note (Signed)
Chronic, stable off of the trelegy. Advised patient to let me know if symptoms were to change so we can look into insurance formulary.

## 2021-09-17 NOTE — Patient Instructions (Addendum)
Let me know if Trulicity if covered or any medications in the GLP 1 drug class.   Please have eye exam done and let me know if headaches continue.  Referral for colonoscopy Let us know if you dont hear back within a week in regards to an appointment being scheduled.    Hats off to  you with your a1c!

## 2021-09-17 NOTE — Progress Notes (Signed)
Subjective:    Patient ID: Michael Boyle, male    DOB: January 23, 1966, 56 y.o.   MRN: [id number]  CC: Michael Boyle is a 56 y.o. male who presents today for follow up.   HPI: Feels well today No new concerns    HA have improved. HA frequency is less. HA is not severe, rates 2/10. HA presents in frontal above eyes. No vision loss, numbness. HA is not positional, doesn't awaken him from sleep, or HA with increase in pressure such as sneeze or valsalva.    He thinks he needs an eye exam and straining to see likely causing HA. He sits in front of a computer most of day.  He wears glasses.  He is compliant with OSA.   Memory change is  less noticeable. He describes being forgetful as work however he manages 8 different jobs.   B12 deficiency. He hasnt felt improvement on b12.   COPD- he hasnt been on trelegy in 3 months due to insurance cost. No more sob. He continues have with DOE with multiple . NO cp, wheezing, increased sputum.   DM-compliant with metformin XR 2000 mg qhs; he was unable to start Ozempic 0.25 mg due to insurance. He is eating less and focused on this diet.   Low back pain-compliant with Cymbalta 30mg . Back pain is unchanged.    HISTORY:  Past Medical History:  Diagnosis Date   Colon polyps    COPD (chronic obstructive pulmonary disease) ([place])    Diabetes mellitus with complication (HCC)    Genetic testing 12/07/2017   Common Cancers panel (47 genes) @ [facility] - No pathogenic mutations detected   GERD (gastroesophageal reflux disease)    Gout    Incomplete left bundle branch block    Incomplete left bundle branch block (LBBB)    a. TTE 4/17: EF of 60-65%, unable to assess wall motion, normal LV diastolic function parameters, mildly calcifief mitral annulus   Morbid obesity (HCC)    Palpitations    a. felt to be pSVT   Palpitations    Venous insufficiency    Past Surgical History:  Procedure Laterality Date   COLONOSCOPY WITH PROPOFOL N/A 08/14/2015    Procedure: COLONOSCOPY WITH PROPOFOL;  Surgeon: Lollie Sails, MD;  Location: [facility] ENDOSCOPY;  Service: Endoscopy;  Laterality: N/A;   COLONOSCOPY WITH PROPOFOL N/A 11/02/2017   Procedure: COLONOSCOPY WITH PROPOFOL;  Surgeon: Lollie Sails, MD;  Location: [facility] ENDOSCOPY;  Service: Endoscopy;  Laterality: N/A;   COLONOSCOPY WITH PROPOFOL N/A 02/28/2020   Procedure: COLONOSCOPY WITH PROPOFOL;  Surgeon: Jonathon Bellows, MD;  Location: [facility] ENDOSCOPY;  Service: Gastroenterology;  Laterality: N/A;   ESOPHAGOGASTRODUODENOSCOPY (EGD) WITH PROPOFOL N/A 08/14/2015   Procedure: ESOPHAGOGASTRODUODENOSCOPY (EGD) WITH PROPOFOL;  Surgeon: Lollie Sails, MD;  Location: [facility] ENDOSCOPY;  Service: Endoscopy;  Laterality: N/A;   WISDOM TOOTH EXTRACTION     Family History  Problem Relation Age of Onset   Cancer Sister        lymphoma dx 75s; currently 55   Diabetes Sister    Cirrhosis Sister    Colon cancer Paternal Uncle        dx 36s; deceased 56   Prostate cancer Paternal Uncle        dx 67s; currently 20   Dementia Maternal Aunt 85   Thyroid cancer Neg Hx     Allergies: Patient has no known allergies. Current Outpatient Medications on File Prior to Visit  Medication Sig Dispense Refill  albuterol (VENTOLIN HFA) 108 (90 Base) MCG/ACT inhaler Inhale 2 puffs into the lungs every 6 (six) hours as needed for wheezing or shortness of breath. 1 each 2   allopurinol (ZYLOPRIM) 100 MG tablet Take 1 tablet (100 mg total) by mouth 2 (two) times daily. 180 tablet 0   aspirin EC 81 MG tablet Take 1 tablet (81 mg total) by mouth daily. 90 tablet 3   DULoxetine (CYMBALTA) 30 MG capsule Take one 30 mg capsule by mouth once a day for the first week. Then increase to two 30 mg capsules ( total 60mg ) by mouth once daily. (Patient taking differently: 30 mg daily. Take one 30 mg capsule by mouth once a day for the first week. Then increase to two 30 mg capsules ( total 60mg ) by mouth once daily.) 60 capsule 0    glucose blood test strip Use as instructed 50 each 12   losartan (COZAAR) 25 MG tablet Take 1 tablet (25 mg total) by mouth daily. 90 tablet 0   metFORMIN (GLUCOPHAGE-XR) 500 MG 24 hr tablet Take 4 tablets (2,000 mg total) by mouth every evening. 120 tablet 0   pantoprazole (PROTONIX) 40 MG tablet Take 1 tablet (40 mg total) by mouth daily. 90 tablet 3   potassium chloride SA (KLOR-CON) 20 MEQ tablet TAKE ONE TABLET PER DAY WEDNESDAY & FRIDAYS. TAKE TWO TABLETS PER DAY THE REST OF DAYS OF THE WEEK. 90 tablet 3   rosuvastatin (CRESTOR) 10 MG tablet Take 1 tablet (10 mg total) by mouth daily. 90 tablet 3   torsemide (DEMADEX) 20 MG tablet Take 1 tablet (20 mg total) by mouth daily. 90 tablet 3   verapamil (CALAN-SR) 180 MG CR tablet Take 1 tablet (180 mg total) by mouth 2 (two) times daily. (Patient taking differently: Take 360 mg by mouth at bedtime.) 180 tablet 0   No current facility-administered medications on file prior to visit.    Social History   Tobacco Use   Smoking status: Former    Packs/day: 1.75    Years: 30.00    Pack years: 52.50    Types: Cigarettes    Quit date: 06/13/2012    Years since quitting: 9.2   Smokeless tobacco: Never  Vaping Use   Vaping Use: Former  Substance Use Topics   Alcohol use: No    Alcohol/week: 0.0 standard drinks   Drug use: No    Review of Systems  Constitutional:  Negative for chills and fever.  HENT:  Negative for congestion, ear pain, rhinorrhea, sinus pressure and sore throat.   Respiratory:  Negative for cough, shortness of breath and wheezing.   Cardiovascular:  Negative for chest pain and palpitations.  Gastrointestinal:  Negative for diarrhea, nausea and vomiting.  Genitourinary:  Negative for dysuria.  Musculoskeletal:  Positive for back pain (chronic). Negative for myalgias.  Skin:  Negative for rash.  Neurological:  Negative for headaches (improved).  Hematological:  Negative for adenopathy.     Objective:    BP 122/70 (BP  Location: Left Arm, Patient Position: Sitting, Cuff Size: Large)    Pulse (!) 57    Temp 97.6 F (36.4 C) (Oral)    Ht 5\' 11"  (1.803 m)    Wt (!) 399 lb (181 kg)    SpO2 96%    BMI 55.65 kg/m  BP Readings from Last 3 Encounters:  09/17/21 122/70  07/30/21 112/79  04/05/21 128/82   Wt Readings from Last 3 Encounters:  09/17/21 (!) 399 lb (181  kg)  07/27/21 (!) 402 lb (182.3 kg)  04/05/21 (!) 409 lb 6.4 oz (185.7 kg)    Physical Exam Vitals reviewed.  Constitutional:      Appearance: He is well-developed.  Cardiovascular:     Rate and Rhythm: Regular rhythm.     Heart sounds: Normal heart sounds.  Pulmonary:     Effort: Pulmonary effort is normal. No respiratory distress.     Breath sounds: Normal breath sounds. No wheezing, rhonchi or rales.  Skin:    General: Skin is warm and dry.  Neurological:     Mental Status: He is alert.  Psychiatric:        Speech: Speech normal.        Behavior: Behavior normal.       Assessment & Plan:   Problem List Items Addressed This Visit       Respiratory   COPD (chronic obstructive pulmonary disease) ([place])    Chronic, stable off of the trelegy. Advised patient to let me know if symptoms were to change so we can look into insurance formulary.         Endocrine   Diabetes mellitus without complication ([place])    Lab Results  Component Value Date   HGBA1C 6.8 (A) 09/17/2021  Marked improvement from prior. Congratulated patient on lifestyle changes. Continue  Metformin xr 2000mg  qd. He will ask pharmacy if another GLP 1 covered with new insurance as we agreed that he would benefit from continued weight loss.       Type 2 diabetes mellitus with hyperlipidemia (HCC) - Primary   Relevant Orders   POCT HgB A1C (Completed)     Other   B12 deficiency    No significant benefit from b12 ; if b12 normal ( pending lab), he is inclined to stop supplementation at this time.       Relevant Orders   B12 and Folate Panel   Chronic back pain     Chronic, stable. Continue cymbalta 30mg g      Headache    Improved. He is not worried about HA at this time. Declines neuroimaging at this time. He will arrange eye exam and he will continue to monitor headaches at home       Memory change    No longer of concern to patient at this time. He will let me know if this were to change.       Other Visit Diagnoses     Screen for colon cancer       Relevant Orders   Ambulatory referral to Gastroenterology   Need for Tdap vaccination       Relevant Orders   Tdap vaccine greater than or equal to 7yo IM (Completed)        I am having [facility] [place] [place] maintain his glucose blood, potassium chloride SA, albuterol, aspirin EC, pantoprazole, rosuvastatin, verapamil, allopurinol, losartan, DULoxetine, torsemide, and metFORMIN.   No orders of the defined types were placed in this encounter.   Return precautions given.   Risks, benefits, and alternatives of the medications and treatment plan prescribed today were discussed, and patient expressed understanding.   Education regarding symptom management and diagnosis given to patient on AVS.  Continue to follow with Burnard Hawthorne, FNP for routine health maintenance.   Louann Liv and I agreed with plan.   Mable Paris, FNP

## 2021-09-17 NOTE — Assessment & Plan Note (Signed)
Chronic, stable. Continue cymbalta 30mg g

## 2021-09-17 NOTE — Assessment & Plan Note (Addendum)
No significant benefit from b12 ; if b12 normal ( pending lab), he is inclined to stop supplementation at this time.

## 2021-09-17 NOTE — Assessment & Plan Note (Signed)
Lab Results  Component Value Date   HGBA1C 6.8 (A) 09/17/2021   Marked improvement from prior. Congratulated patient on lifestyle changes. Continue  Metformin xr 2000mg  qd. He will ask pharmacy if another GLP 1 covered with new insurance as we agreed that he would benefit from continued weight loss.

## 2021-09-17 NOTE — Assessment & Plan Note (Signed)
No longer of concern to patient at this time. He will let me know if this were to change.

## 2021-09-17 NOTE — Assessment & Plan Note (Addendum)
Improved. He is not worried about HA at this time. Declines neuroimaging at this time. He will arrange eye exam and he will continue to monitor headaches at home

## 2021-09-20 ENCOUNTER — Other Ambulatory Visit: Payer: Self-pay

## 2021-09-20 DIAGNOSIS — Z86010 Personal history of colonic polyps: Secondary | ICD-10-CM

## 2021-09-20 MED ORDER — PEG 3350-KCL-NA BICARB-NACL 420 G PO SOLR
4000.0000 mL | Freq: Once | ORAL | 0 refills | Status: AC
Start: 2021-09-20 — End: 2021-09-20

## 2021-09-20 NOTE — Progress Notes (Signed)
Gastroenterology Pre-Procedure Review  Request Date: 10/14/2021 Requesting Physician: Dr. Vicente Males  PATIENT REVIEW QUESTIONS: The patient responded to the following health history questions as indicated:    1. Are you having any GI issues? no 2. Do you have a personal history of Polyps? yes (02/2020 polyps removed.) 3. Do you have a family history of Colon Cancer or Polyps? no 4. Diabetes Mellitus? yes (Type II) 5. Joint replacements in the past 12 months?no 6. Major health problems in the past 3 months?no 7. Any artificial heart valves, MVP, or defibrillator?no    MEDICATIONS & ALLERGIES:    Patient reports the following regarding taking any anticoagulation/antiplatelet therapy:   Plavix, Coumadin, Eliquis, Xarelto, Lovenox, Pradaxa, Brilinta, or Effient? no Aspirin? yes (81 mg)  Patient confirms/reports the following medications:  Current Outpatient Medications  Medication Sig Dispense Refill   albuterol (VENTOLIN HFA) 108 (90 Base) MCG/ACT inhaler Inhale 2 puffs into the lungs every 6 (six) hours as needed for wheezing or shortness of breath. 1 each 2   allopurinol (ZYLOPRIM) 100 MG tablet Take 1 tablet (100 mg total) by mouth 2 (two) times daily. 180 tablet 0   aspirin EC 81 MG tablet Take 1 tablet (81 mg total) by mouth daily. 90 tablet 3   DULoxetine (CYMBALTA) 30 MG capsule Take one 30 mg capsule by mouth once a day for the first week. Then increase to two 30 mg capsules ( total 60mg ) by mouth once daily. (Patient taking differently: 30 mg daily. Take one 30 mg capsule by mouth once a day for the first week. Then increase to two 30 mg capsules ( total 60mg ) by mouth once daily.) 60 capsule 0   glucose blood test strip Use as instructed 50 each 12   losartan (COZAAR) 25 MG tablet Take 1 tablet (25 mg total) by mouth daily. 90 tablet 0   metFORMIN (GLUCOPHAGE-XR) 500 MG 24 hr tablet Take 4 tablets (2,000 mg total) by mouth every evening. 120 tablet 0   pantoprazole (PROTONIX) 40 MG  tablet Take 1 tablet (40 mg total) by mouth daily. 90 tablet 3   potassium chloride SA (KLOR-CON) 20 MEQ tablet TAKE ONE TABLET PER DAY WEDNESDAY & FRIDAYS. TAKE TWO TABLETS PER DAY THE REST OF DAYS OF THE WEEK. 90 tablet 3   rosuvastatin (CRESTOR) 10 MG tablet Take 1 tablet (10 mg total) by mouth daily. 90 tablet 3   torsemide (DEMADEX) 20 MG tablet Take 1 tablet (20 mg total) by mouth daily. 90 tablet 3   verapamil (CALAN-SR) 180 MG CR tablet Take 1 tablet (180 mg total) by mouth 2 (two) times daily. (Patient taking differently: Take 360 mg by mouth at bedtime.) 180 tablet 0   No current facility-administered medications for this visit.    Patient confirms/reports the following allergies:  No Known Allergies  No orders of the defined types were placed in this encounter.   AUTHORIZATION INFORMATION Primary Insurance: 1D#: Group #:  Secondary Insurance: 1D#: Group #:  SCHEDULE INFORMATION: Date: 10/14/2021  Time: Location: [place]

## 2021-09-21 ENCOUNTER — Other Ambulatory Visit: Payer: Self-pay

## 2021-09-21 DIAGNOSIS — Z87891 Personal history of nicotine dependence: Secondary | ICD-10-CM

## 2021-09-24 ENCOUNTER — Other Ambulatory Visit: Payer: Self-pay | Admitting: Family

## 2021-09-24 ENCOUNTER — Ambulatory Visit: Payer: 59

## 2021-09-24 DIAGNOSIS — G8929 Other chronic pain: Secondary | ICD-10-CM

## 2021-09-24 DIAGNOSIS — E119 Type 2 diabetes mellitus without complications: Secondary | ICD-10-CM

## 2021-09-24 DIAGNOSIS — M1A2720 Drug-induced chronic gout, left ankle and foot, without tophus (tophi): Secondary | ICD-10-CM

## 2021-09-24 DIAGNOSIS — I10 Essential (primary) hypertension: Secondary | ICD-10-CM

## 2021-09-24 DIAGNOSIS — I479 Paroxysmal tachycardia, unspecified: Secondary | ICD-10-CM

## 2021-09-24 DIAGNOSIS — M549 Dorsalgia, unspecified: Secondary | ICD-10-CM

## 2021-09-24 DIAGNOSIS — K219 Gastro-esophageal reflux disease without esophagitis: Secondary | ICD-10-CM

## 2021-10-05 ENCOUNTER — Ambulatory Visit
Admission: RE | Admit: 2021-10-05 | Discharge: 2021-10-05 | Disposition: A | Discharge status: Home / Self Care | Payer: 59 | Source: Ambulatory Visit | Attending: Acute Care | Admitting: Acute Care

## 2021-10-05 DIAGNOSIS — J432 Centrilobular emphysema: Secondary | ICD-10-CM | POA: Diagnosis not present

## 2021-10-05 DIAGNOSIS — M47814 Spondylosis without myelopathy or radiculopathy, thoracic region: Secondary | ICD-10-CM | POA: Diagnosis not present

## 2021-10-05 DIAGNOSIS — Z87891 Personal history of nicotine dependence: Secondary | ICD-10-CM | POA: Diagnosis not present

## 2021-10-05 DIAGNOSIS — I2510 Atherosclerotic heart disease of native coronary artery without angina pectoris: Secondary | ICD-10-CM | POA: Diagnosis not present

## 2021-10-06 ENCOUNTER — Other Ambulatory Visit: Payer: Self-pay

## 2021-10-06 DIAGNOSIS — Z87891 Personal history of nicotine dependence: Secondary | ICD-10-CM

## 2021-10-13 ENCOUNTER — Telehealth: Payer: Self-pay | Admitting: Family

## 2021-10-13 NOTE — Telephone Encounter (Signed)
Please mail letter ? ?Michael Boyle,  ? ?Hope you are doing well.  ? ?I received results from your annual CT lung scan from Eric Form, NP which showed benign findings of lungs. You will need do this again next year so please ensure you are contacted in regards to this and certainly call our office if you are not contacted for another test.  ? ?It was noted again on the exam that you have coronary artery atherosclerosis, or often referred to as hardening of the arteries.  ? ?This can put you at risk for stroke, heart attack.  ? ?Please continue regular follow up with me and continue cholesterol medication . ? ? ? ?Best,  ? ?Mable Paris, NP ? ?

## 2021-10-14 ENCOUNTER — Ambulatory Visit: Payer: 59 | Admitting: Certified Registered"

## 2021-10-14 ENCOUNTER — Ambulatory Visit
Admission: RE | Admit: 2021-10-14 | Discharge: 2021-10-14 | Disposition: A | Discharge status: Home / Self Care | Payer: 59 | Attending: Gastroenterology | Admitting: Gastroenterology

## 2021-10-14 ENCOUNTER — Encounter: Payer: Self-pay | Admitting: Gastroenterology

## 2021-10-14 ENCOUNTER — Encounter
Admission: RE | Disposition: A | Discharge status: Home / Self Care | Payer: Self-pay | Source: Home / Self Care | Attending: Gastroenterology

## 2021-10-14 DIAGNOSIS — D126 Benign neoplasm of colon, unspecified: Secondary | ICD-10-CM | POA: Diagnosis not present

## 2021-10-14 DIAGNOSIS — J449 Chronic obstructive pulmonary disease, unspecified: Secondary | ICD-10-CM | POA: Insufficient documentation

## 2021-10-14 DIAGNOSIS — K219 Gastro-esophageal reflux disease without esophagitis: Secondary | ICD-10-CM | POA: Insufficient documentation

## 2021-10-14 DIAGNOSIS — Q438 Other specified congenital malformations of intestine: Secondary | ICD-10-CM | POA: Insufficient documentation

## 2021-10-14 DIAGNOSIS — Z86010 Personal history of colonic polyps: Secondary | ICD-10-CM

## 2021-10-14 DIAGNOSIS — D122 Benign neoplasm of ascending colon: Secondary | ICD-10-CM | POA: Insufficient documentation

## 2021-10-14 DIAGNOSIS — I4891 Unspecified atrial fibrillation: Secondary | ICD-10-CM | POA: Diagnosis not present

## 2021-10-14 DIAGNOSIS — K635 Polyp of colon: Secondary | ICD-10-CM

## 2021-10-14 DIAGNOSIS — G4730 Sleep apnea, unspecified: Secondary | ICD-10-CM | POA: Diagnosis not present

## 2021-10-14 DIAGNOSIS — Z87891 Personal history of nicotine dependence: Secondary | ICD-10-CM | POA: Diagnosis not present

## 2021-10-14 DIAGNOSIS — Z1211 Encounter for screening for malignant neoplasm of colon: Secondary | ICD-10-CM | POA: Insufficient documentation

## 2021-10-14 HISTORY — PX: COLONOSCOPY WITH PROPOFOL: SHX5780

## 2021-10-14 LAB — GLUCOSE, CAPILLARY: Glucose-Capillary: 95 mg/dL (ref 70–99)

## 2021-10-14 SURGERY — COLONOSCOPY WITH PROPOFOL
Anesthesia: General

## 2021-10-14 MED ORDER — PHENYLEPHRINE HCL (PRESSORS) 10 MG/ML IV SOLN
INTRAVENOUS | Status: AC
  Filled 2021-10-14: qty 1

## 2021-10-14 MED ORDER — PROPOFOL 500 MG/50ML IV EMUL
INTRAVENOUS | Status: DC | PRN
  Administered 2021-10-14: 100 ug/kg/min via INTRAVENOUS

## 2021-10-14 MED ORDER — SODIUM CHLORIDE 0.9 % IV SOLN: INTRAVENOUS | Status: DC

## 2021-10-14 MED ORDER — SODIUM CHLORIDE (PF) 0.9 % IJ SOLN
INTRAMUSCULAR | Status: DC | PRN
  Administered 2021-10-14: 8 mL

## 2021-10-14 MED ORDER — PROPOFOL 10 MG/ML IV BOLUS
INTRAVENOUS | Status: DC | PRN
  Administered 2021-10-14: 100 mg via INTRAVENOUS

## 2021-10-14 MED ORDER — PROPOFOL 500 MG/50ML IV EMUL
INTRAVENOUS | Status: AC
  Filled 2021-10-14: qty 50

## 2021-10-14 MED ORDER — PROPOFOL 10 MG/ML IV BOLUS
INTRAVENOUS | Status: AC
  Filled 2021-10-14: qty 20

## 2021-10-14 NOTE — Anesthesia Postprocedure Evaluation (Signed)
Anesthesia Post Note ? ?Patient: AAHAN MARQUES ? ?Procedure(s) Performed: COLONOSCOPY WITH PROPOFOL ? ?Patient location during evaluation: Endoscopy ?Anesthesia Type: General ?Level of consciousness: awake and alert ?Pain management: pain level controlled ?Vital Signs Assessment: post-procedure vital signs reviewed and stable ?Respiratory status: spontaneous breathing, nonlabored ventilation, respiratory function stable and patient connected to nasal cannula oxygen ?Cardiovascular status: blood pressure returned to baseline and stable ?Postop Assessment: no apparent nausea or vomiting ?Anesthetic complications: no ? ? ?No notable events documented. ? ? ?Last Vitals:  ?Vitals:  ? 10/14/21 0850 10/14/21 0900  ?BP: 138/72 137/69  ?Pulse: 65 (!) 58  ?Resp: (!) 21 17  ?Temp:    ?SpO2: 96% 96%  ?  ?Last Pain:  ?Vitals:  ? 10/14/21 0830  ?TempSrc: Temporal  ?PainSc:   ? ? ?  ?  ?  ?  ?  ?  ? ?Martha Clan ? ? ? ? ?

## 2021-10-14 NOTE — Op Note (Signed)
[facility] ?Gastroenterology ?Patient Name: Michael Boyle ?Procedure Date: 10/14/2021 7:44 AM ?MRN: [id number] ?Account #: [id number] ?Date of Birth: 03/16/1966 ?Admit Type: Outpatient ?Age: 56 ?Room: [facility] ENDO ROOM 3 ?Gender: Male ?Note Status: Finalized ?Instrument Name: Colonoscope [id number] ?Procedure:             Colonoscopy ?Indications:           Surveillance: Personal history of adenomatous polyps  ?                       on last colonoscopy 5 years ago ?Providers:             Jonathon Bellows MD, MD ?Referring MD:          Yvetta Coder. Arnett (Referring MD) ?Medicines:             Monitored Anesthesia Care ?Complications:         No immediate complications. ?Procedure:             Pre-Anesthesia Assessment: ?                       - Prior to the procedure, a History and Physical was  ?                       performed, and patient medications, allergies and  ?                       sensitivities were reviewed. The patient's tolerance  ?                       of previous anesthesia was reviewed. ?                       - The risks and benefits of the procedure and the  ?                       sedation options and risks were discussed with the  ?                       patient. All questions were answered and informed  ?                       consent was obtained. ?                       - ASA Grade Assessment: II - A patient with mild  ?                       systemic disease. ?                       After obtaining informed consent, the colonoscope was  ?                       passed under direct vision. Throughout the procedure,  ?                       the patient's blood pressure, pulse, and oxygen  ?                       saturations  were monitored continuously. The  ?                       Colonoscope was introduced through the anus and  ?                       advanced to the the cecum, identified by the  ?                       appendiceal orifice. The colonoscopy was performed  ?                        with moderate difficulty due to a tortuous colon. The  ?                       patient tolerated the procedure well. The quality of  ?                       the bowel preparation was adequate. ?Findings: ?     The perianal and digital rectal examinations were normal. ?     A 15 mm polyp was found in the proximal ascending colon. The polyp was  ?     sessile. Preparations were made for mucosal resection. Saline was  ?     injected to raise the lesion. Snare mucosal resection was performed.  ?     Resection and retrieval were complete. To close a defect after mucosal  ?     resection, one hemostatic clip was successfully placed. There was no  ?     bleeding during, or at the end, of the procedure. Borders of polyp  ?     makred using blue or NBI light ?     The exam was otherwise without abnormality on direct and retroflexion  ?     views. ?     A 5 mm polyp was found in the transverse colon. The polyp was sessile.  ?     The polyp was removed with a cold snare. Resection and retrieval were  ?     complete. ?Impression:            - One 15 mm polyp in the proximal ascending colon,  ?                       removed with mucosal resection. Resected and  ?                       retrieved. Clip was placed. ?                       - The examination was otherwise normal on direct and  ?                       retroflexion views. ?                       - Mucosal resection was performed. Resection and  ?                       retrieval were complete. ?Recommendation:        - Discharge patient to home (with escort). ?                       -  Resume previous diet. ?                       - Continue present medications. ?                       - Await pathology results. ?                       - Repeat colonoscopy in 3 years for surveillance. ?Procedure Code(s):     --- Professional --- ?                       [phone], Colonoscopy, flexible; with endoscopic mucosal  ?                       resection ?                        06269, 59, Colonoscopy, flexible; with removal of  ?                       tumor(s), polyp(s), or other lesion(s) by snare  ?                       technique ?Diagnosis Code(s):     --- Professional --- ?                       Z86.010, Personal history of colonic polyps ?                       K63.5, Polyp of colon ?CPT copyright 2019 American Medical Association. All rights reserved. ?The codes documented in this report are preliminary and upon coder review may  ?be revised to meet current compliance requirements. ?Jonathon Bellows, MD ?Jonathon Bellows MD, MD ?10/14/2021 8:30:25 AM ?This report has been signed electronically. ?Number of Addenda: 0 ?Note Initiated On: 10/14/2021 7:44 AM ?Scope Withdrawal Time: 0 hours 21 minutes 19 seconds  ?Total Procedure Duration: 0 hours 31 minutes 59 seconds  ?Estimated Blood Loss:  Estimated blood loss: none. ?     [facility] ?

## 2021-10-14 NOTE — Anesthesia Preprocedure Evaluation (Signed)
Anesthesia Evaluation  ?Patient identified by MRN, date of birth, ID band ?Patient awake ? ? ? ?Reviewed: ?Allergy & Precautions, H&P , NPO status , Patient's Chart, lab work & pertinent test results, reviewed documented beta blocker date and time  ? ?History of Anesthesia Complications ?Negative for: history of anesthetic complications ? ?Airway ?Mallampati: II ? ?TM Distance: >3 FB ?Neck ROM: full ? ? ? Dental ?no notable dental hx. ?(+) Partial Upper, Missing, Dental Advidsory Given, Caps ?  ?Pulmonary ?neg shortness of breath, sleep apnea and Continuous Positive Airway Pressure Ventilation , COPD, neg recent URI, former smoker,  ?  ?Pulmonary exam normal ?breath sounds clear to auscultation ? ? ? ? ? ? Cardiovascular ?Exercise Tolerance: Good ?(-) hypertension(-) angina(-) CAD, (-) Past MI, (-) Cardiac Stents and (-) CABG Normal cardiovascular exam+ dysrhythmias Atrial Fibrillation (-) Valvular Problems/Murmurs ?Rhythm:regular Rate:Normal ? ? ?  ?Neuro/Psych ?negative neurological ROS ? negative psych ROS  ? GI/Hepatic ?Neg liver ROS, GERD  Medicated and Controlled,  ?Endo/Other  ?diabetesMorbid obesity ? Renal/GU ?negative Renal ROS  ?negative genitourinary ?  ?Musculoskeletal ? ? Abdominal ?  ?Peds ? Hematology ?negative hematology ROS ?(+)   ?Anesthesia Other Findings ?Past Medical History: ?  GERD (gastroesophageal reflux disease)                     ?  COPD (chronic obstructive pulmonary disease) (*            ?  Atrial fibrillation ([place])                                  ? ? Reproductive/Obstetrics ?negative OB ROS ? ?  ? ? ? ? ? ? ? ? ? ? ? ? ? ?  ?  ? ? ? ? ? ? ? ? ?Anesthesia Physical ? ?Anesthesia Plan ? ?ASA: 3 ? ?Anesthesia Plan: General  ? ?Post-op Pain Management:   ? ?Induction: Intravenous ? ?PONV Risk Score and Plan: 1 and Propofol infusion and TIVA ? ?Airway Management Planned: Natural Airway and Nasal Cannula ? ?Additional Equipment:  ? ?Intra-op Plan:   ? ?Post-operative Plan:  ? ?Informed Consent: I have reviewed the patients History and Physical, chart, labs and discussed the procedure including the risks, benefits and alternatives for the proposed anesthesia with the patient or authorized representative who has indicated his/her understanding and acceptance.  ? ? ? ?Dental Advisory Given ? ?Plan Discussed with: Anesthesiologist, CRNA and Surgeon ? ?Anesthesia Plan Comments:   ? ? ? ? ? ? ?Anesthesia Quick Evaluation ? ?

## 2021-10-14 NOTE — Telephone Encounter (Signed)
LETTER MAILED

## 2021-10-14 NOTE — Transfer of Care (Signed)
Immediate Anesthesia Transfer of Care Note ? ?Patient: Michael Boyle ? ?Procedure(s) Performed: COLONOSCOPY WITH PROPOFOL ? ?Patient Location: PACU ? ?Anesthesia Type:General ? ?Level of Consciousness: awake and alert  ? ?Airway & Oxygen Therapy: Patient Spontanous Breathing and Patient connected to face mask oxygen ? ?Post-op Assessment: Report given to RN and Post -op Vital signs reviewed and stable ? ?Post vital signs: Reviewed and stable ? ?Last Vitals:  ?Vitals Value Taken Time  ?BP 114/82 10/14/21 0831  ?Temp 36.1 ?C 10/14/21 0830  ?Pulse 82 10/14/21 0833  ?Resp 17 10/14/21 0833  ?SpO2 98 % 10/14/21 0833  ?Vitals shown include unvalidated device data. ? ?Last Pain:  ?Vitals:  ? 10/14/21 0830  ?TempSrc: Temporal  ?PainSc:   ?   ? ?  ? ?Complications: No notable events documented. ?

## 2021-10-14 NOTE — H&P (Signed)
?Michael Bellows, MD ?[address] [place], [place], [place], [address] ?[address], [place], [place], [place], [address] ?Phone: [phone]  ?Fax: [phone] ? ?Primary Care Physician:  Burnard Hawthorne, FNP ? ? ?Pre-Procedure History & Physical: ?HPI:  Michael Boyle is a 56 y.o. male is here for an colonoscopy. ?  ?Past Medical History:  ?Diagnosis Date  ? Colon polyps   ? COPD (chronic obstructive pulmonary disease) ([place])   ? Diabetes mellitus with complication ([place])   ? Genetic testing 12/07/2017  ? Common Cancers panel (47 genes) @ [facility] - No pathogenic mutations detected  ? GERD (gastroesophageal reflux disease)   ? Gout   ? Incomplete left bundle branch block   ? Incomplete left bundle branch block (LBBB)   ? a. TTE 4/17: EF of 60-65%, unable to assess wall motion, normal LV diastolic function parameters, mildly calcifief mitral annulus  ? Morbid obesity ([place])   ? Palpitations   ? 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 Palpitations   ? Venous insufficiency   ? ? ?Past Surgical History:  ?Procedure Laterality Date  ? COLONOSCOPY WITH PROPOFOL N/A 08/14/2015  ? Procedure: COLONOSCOPY WITH PROPOFOL;  Surgeon: Lollie Sails, MD;  Location: [facility] ENDOSCOPY;  Service: Endoscopy;  Laterality: N/A;  ? COLONOSCOPY WITH PROPOFOL N/A 11/02/2017  ? Procedure: COLONOSCOPY WITH PROPOFOL;  Surgeon: Lollie Sails, MD;  Location: [facility] ENDOSCOPY;  Service: Endoscopy;  Laterality: N/A;  ? COLONOSCOPY WITH PROPOFOL N/A 02/28/2020  ? Procedure: COLONOSCOPY WITH PROPOFOL;  Surgeon: Michael Bellows, MD;  Location: [facility] ENDOSCOPY;  Service: Gastroenterology;  Laterality: N/A;  ? ESOPHAGOGASTRODUODENOSCOPY (EGD) WITH PROPOFOL N/A 08/14/2015  ? Procedure: ESOPHAGOGASTRODUODENOSCOPY (EGD) WITH PROPOFOL;  Surgeon: Lollie Sails, MD;  Location: [facility] ENDOSCOPY;  Service: Endoscopy;  Laterality: N/A;  ? WISDOM TOOTH EXTRACTION    ? ? ?Prior to Admission medications   ?Medication Sig Start Date End Date Taking? Authorizing Provider   ?allopurinol (ZYLOPRIM) 100 MG tablet Take 1 tablet (100 mg total) by mouth 2 (two) times daily. 09/24/21  Yes Burnard Hawthorne, FNP  ?aspirin EC 81 MG tablet Take 1 tablet (81 mg total) by mouth daily. 09/11/20  Yes Burnard Hawthorne, FNP  ?DULoxetine (CYMBALTA) 30 MG capsule Take one 30 mg capsule by mouth once a day for the first week. Then increase to two 30 mg capsules ( total '60mg'$ ) by mouth once daily. 09/24/21  Yes Burnard Hawthorne, FNP  ?losartan (COZAAR) 25 MG tablet Take 1 tablet (25 mg total) by mouth daily. 09/24/21  Yes Burnard Hawthorne, FNP  ?pantoprazole (PROTONIX) 40 MG tablet Take 1 tablet (40 mg total) by mouth daily. 09/24/21  Yes Burnard Hawthorne, FNP  ?potassium chloride SA (KLOR-CON) 20 MEQ tablet TAKE ONE TABLET PER DAY WEDNESDAY & FRIDAYS. TAKE TWO TABLETS PER DAY THE REST OF DAYS OF THE WEEK. 09/11/20  Yes Arnett, Yvetta Coder, FNP  ?rosuvastatin (CRESTOR) 10 MG tablet Take 1 tablet (10 mg total) by mouth daily. 09/24/21  Yes Burnard Hawthorne, FNP  ?torsemide (DEMADEX) 20 MG tablet Take 1 tablet (20 mg total) by mouth daily. 07/30/21  Yes Loletha Grayer, MD  ?verapamil (CALAN-SR) 180 MG CR tablet Take 1 tablet (180 mg total) by mouth 2 (two) times daily. 09/24/21  Yes Arnett, Yvetta Coder, FNP  ?albuterol (VENTOLIN HFA) 108 (90 Base) MCG/ACT inhaler Inhale 2 puffs into the lungs every 6 (six) hours as needed for wheezing or shortness of breath. 09/11/20   Arnett,  Yvetta Coder, FNP  ?glucose blood test strip Use as instructed 05/09/19   Jodelle Green, FNP  ?metFORMIN (GLUCOPHAGE-XR) 500 MG 24 hr tablet Take 4 tablets (2,000 mg total) by mouth every evening. 09/24/21   Burnard Hawthorne, FNP  ? ? ?Allergies as of 09/20/2021  ? (No Known Allergies)  ? ? ?Family History  ?Problem Relation Age of Onset  ? Cancer Sister   ?     lymphoma dx 33s; currently 36  ? Diabetes Sister   ? Cirrhosis Sister   ? Colon cancer Paternal Uncle   ?     dx 43s; deceased 80  ? Prostate cancer Paternal Uncle   ?     dx  38s; currently 76  ? Dementia Maternal Aunt 85  ? Thyroid cancer Neg Hx   ? ? ?Social History  ? ?Socioeconomic History  ? Marital status: Married  ?  Spouse name: Not on file  ? Number of children: Not on file  ? Years of education: Not on file  ? Highest education level: Not on file  ?Occupational History  ? Not on file  ?Tobacco Use  ? Smoking status: Former  ?  Packs/day: 1.75  ?  Years: 30.00  ?  Pack years: 52.50  ?  Types: Cigarettes  ?  Quit date: 06/13/2012  ?  Years since quitting: 9.3  ? Smokeless tobacco: Never  ?Vaping Use  ? Vaping Use: Former  ?Substance and Sexual Activity  ? Alcohol use: No  ?  Alcohol/week: 0.0 standard drinks  ? Drug use: No  ? Sexual activity: Not on file  ?Other Topics Concern  ? Not on file  ?Social History Narrative  ? Not on file  ? ?Social Determinants of Health  ? ?Financial Resource Strain: Not on file  ?Food Insecurity: Not on file  ?Transportation Needs: Not on file  ?Physical Activity: Not on file  ?Stress: Not on file  ?Social Connections: Not on file  ?Intimate Partner Violence: Not on file  ? ? ?Review of Systems: ?See HPI, otherwise negative ROS ? ?Physical Exam: ?BP 139/85   Pulse (!) 58   Temp (!) 97 ?F (36.1 ?C) (Temporal)   Resp 17   Ht '5\' 11"'$  (1.803 m)   Wt (!) 180.5 kg   SpO2 97%   BMI 55.51 kg/m?  ?General:   Alert,  pleasant and cooperative in NAD ?Head:  Normocephalic and atraumatic. ?Neck:  Supple; no masses or thyromegaly. ?Lungs:  Clear throughout to auscultation, normal respiratory effort.    ?Heart:  +S1, +S2, Regular rate and rhythm, No edema. ?Abdomen:  Soft, nontender and nondistended. Normal bowel sounds, without guarding, and without rebound.   ?Neurologic:  Alert and  oriented x4;  grossly normal neurologically. ? ?Impression/Plan: ?Michael Boyle is here for an colonoscopy to be performed for surveillance due to prior history of colon polyps  ? ?Risks, benefits, limitations, and alternatives regarding  colonoscopy have been reviewed with  the patient.  Questions have been answered.  All parties agreeable. ? ? ?Michael Bellows, MD  10/14/2021, 7:44 AM ? ?

## 2021-10-15 LAB — SURGICAL PATHOLOGY

## 2021-10-16 ENCOUNTER — Encounter: Payer: Self-pay | Admitting: Gastroenterology

## 2021-10-27 ENCOUNTER — Other Ambulatory Visit: Payer: Self-pay | Admitting: Family

## 2021-10-27 DIAGNOSIS — E119 Type 2 diabetes mellitus without complications: Secondary | ICD-10-CM

## 2021-10-27 DIAGNOSIS — E876 Hypokalemia: Secondary | ICD-10-CM

## 2021-10-27 DIAGNOSIS — G8929 Other chronic pain: Secondary | ICD-10-CM

## 2021-11-25 ENCOUNTER — Other Ambulatory Visit: Payer: Self-pay | Admitting: Family

## 2021-11-25 DIAGNOSIS — E119 Type 2 diabetes mellitus without complications: Secondary | ICD-10-CM

## 2021-11-25 DIAGNOSIS — G8929 Other chronic pain: Secondary | ICD-10-CM

## 2021-11-25 DIAGNOSIS — E876 Hypokalemia: Secondary | ICD-10-CM

## 2021-12-02 ENCOUNTER — Other Ambulatory Visit: Payer: Self-pay | Admitting: Family

## 2021-12-02 DIAGNOSIS — E876 Hypokalemia: Secondary | ICD-10-CM

## 2021-12-02 DIAGNOSIS — M1A2720 Drug-induced chronic gout, left ankle and foot, without tophus (tophi): Secondary | ICD-10-CM

## 2021-12-02 DIAGNOSIS — G8929 Other chronic pain: Secondary | ICD-10-CM

## 2021-12-02 DIAGNOSIS — E119 Type 2 diabetes mellitus without complications: Secondary | ICD-10-CM

## 2021-12-20 ENCOUNTER — Ambulatory Visit (INDEPENDENT_AMBULATORY_CARE_PROVIDER_SITE_OTHER): Payer: 59 | Admitting: Family

## 2021-12-20 ENCOUNTER — Encounter: Payer: Self-pay | Admitting: Family

## 2021-12-20 ENCOUNTER — Telehealth: Payer: Self-pay | Admitting: Family

## 2021-12-20 VITALS — BP 136/76 | HR 75 | Temp 97.7°F | Ht 71.0 in | Wt >= 6400 oz

## 2021-12-20 DIAGNOSIS — J449 Chronic obstructive pulmonary disease, unspecified: Secondary | ICD-10-CM | POA: Diagnosis not present

## 2021-12-20 DIAGNOSIS — I10 Essential (primary) hypertension: Secondary | ICD-10-CM | POA: Diagnosis not present

## 2021-12-20 DIAGNOSIS — T502X5A Adverse effect of carbonic-anhydrase inhibitors, benzothiadiazides and other diuretics, initial encounter: Secondary | ICD-10-CM

## 2021-12-20 DIAGNOSIS — E119 Type 2 diabetes mellitus without complications: Secondary | ICD-10-CM

## 2021-12-20 DIAGNOSIS — E1169 Type 2 diabetes mellitus with other specified complication: Secondary | ICD-10-CM | POA: Diagnosis not present

## 2021-12-20 DIAGNOSIS — M549 Dorsalgia, unspecified: Secondary | ICD-10-CM | POA: Diagnosis not present

## 2021-12-20 DIAGNOSIS — I700 Atherosclerosis of aorta: Secondary | ICD-10-CM

## 2021-12-20 DIAGNOSIS — K219 Gastro-esophageal reflux disease without esophagitis: Secondary | ICD-10-CM

## 2021-12-20 DIAGNOSIS — M1A2720 Drug-induced chronic gout, left ankle and foot, without tophus (tophi): Secondary | ICD-10-CM

## 2021-12-20 DIAGNOSIS — I479 Paroxysmal tachycardia, unspecified: Secondary | ICD-10-CM

## 2021-12-20 DIAGNOSIS — M7989 Other specified soft tissue disorders: Secondary | ICD-10-CM | POA: Diagnosis not present

## 2021-12-20 DIAGNOSIS — E785 Hyperlipidemia, unspecified: Secondary | ICD-10-CM | POA: Diagnosis not present

## 2021-12-20 DIAGNOSIS — G8929 Other chronic pain: Secondary | ICD-10-CM

## 2021-12-20 DIAGNOSIS — E876 Hypokalemia: Secondary | ICD-10-CM

## 2021-12-20 LAB — POCT GLYCOSYLATED HEMOGLOBIN (HGB A1C): Hemoglobin A1C: 7.3 % — AB (ref 4.0–5.6)

## 2021-12-20 MED ORDER — METFORMIN HCL ER 500 MG PO TB24: 2000.0000 mg | ORAL_TABLET | Freq: Every evening | ORAL | 3 refills | Status: DC

## 2021-12-20 MED ORDER — TORSEMIDE 40 MG PO TABS: 40.0000 mg | ORAL_TABLET | Freq: Every day | ORAL | 3 refills | Status: DC

## 2021-12-20 MED ORDER — FLUTICASONE-SALMETEROL 500-50 MCG/ACT IN AEPB: 1.0000 | INHALATION_SPRAY | Freq: Two times a day (BID) | RESPIRATORY_TRACT | 3 refills | Status: DC

## 2021-12-20 MED ORDER — POTASSIUM CHLORIDE CRYS ER 20 MEQ PO TBCR: 40.0000 meq | EXTENDED_RELEASE_TABLET | Freq: Every day | ORAL | 3 refills | Status: DC

## 2021-12-20 MED ORDER — ROSUVASTATIN CALCIUM 10 MG PO TABS: 10.0000 mg | ORAL_TABLET | Freq: Every day | ORAL | 3 refills | Status: DC

## 2021-12-20 MED ORDER — LOSARTAN POTASSIUM 25 MG PO TABS: 25.0000 mg | ORAL_TABLET | Freq: Every day | ORAL | 3 refills | Status: DC

## 2021-12-20 MED ORDER — VERAPAMIL HCL ER 180 MG PO TBCR: 180.0000 mg | EXTENDED_RELEASE_TABLET | Freq: Two times a day (BID) | ORAL | 3 refills | Status: DC

## 2021-12-20 MED ORDER — DULOXETINE HCL 60 MG PO CPEP: 60.0000 mg | ORAL_CAPSULE | Freq: Every day | ORAL | 3 refills | Status: DC

## 2021-12-20 MED ORDER — TRULICITY 0.75 MG/0.5ML ~~LOC~~ SOAJ: 0.7500 mg | SUBCUTANEOUS | 1 refills | Status: DC

## 2021-12-20 MED ORDER — ALLOPURINOL 100 MG PO TABS: 100.0000 mg | ORAL_TABLET | Freq: Two times a day (BID) | ORAL | 3 refills | Status: DC

## 2021-12-20 MED ORDER — PANTOPRAZOLE SODIUM 40 MG PO TBEC: 40.0000 mg | DELAYED_RELEASE_TABLET | Freq: Every day | ORAL | 3 refills | Status: DC

## 2021-12-20 NOTE — Progress Notes (Signed)
? ?Subjective:  ? ? Patient ID: Michael Boyle, male    DOB: 1965-09-21, 56 y.o.   MRN: [id number] ? ?CC: Michael Boyle is a 56 y.o. male who presents today for follow up.  ? ?HPI: Feels well today.  No new complaints ? ? ?COPD- no longer on trelegy as insurance would not cover.  Shortness of breath is at baseline when he walks long distances or several flights of stairs.  He feels is related to his weight.  Previously he had been on Advair 500-50 with symptom relief.  No chest pain, palpitations or wheezing ? ?DM- compliant with Metformin xr '2000mg'$  qd.  Previously insurance would not fill Ozempic.  He is interested in weight loss ? ?No personal or family history of thyroid cancer ? ? ?Chronic back pain-somewhat controlled and he remains compliant with Cymbalta 60 mg. He continues to have mid back pain when he gets up in the morning or after he sits for long periods of time. Pain resolves when standing. No numbness, weakness, fever, trouble urinating, hematuria, abdominal pain.  ?Pain is not related meals.  ? ?HLD- compliant with crestor '10mg'$  ? ?Colonoscopy is up-to-date ?CT lung cancer screening done 10/05/21 ?HISTORY:  ?Past Medical History:  ?Diagnosis Date  ? Colon polyps   ? COPD (chronic obstructive pulmonary disease) ([place])   ? Diabetes mellitus with complication ([place])   ? Genetic testing 12/07/2017  ? Common Cancers panel (47 genes) @ [facility] - No pathogenic mutations detected  ? GERD (gastroesophageal reflux disease)   ? Gout   ? Incomplete left bundle branch block   ? Incomplete left bundle branch block (LBBB)   ? a. TTE 4/17: EF of 60-65%, unable to assess wall motion, normal LV diastolic function parameters, mildly calcifief mitral annulus  ? Morbid obesity ([place])   ? Palpitations   ? 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 Palpitations   ? Venous insufficiency   ? ?Past Surgical History:  ?Procedure Laterality Date  ? COLONOSCOPY WITH PROPOFOL N/A 08/14/2015  ? Procedure: COLONOSCOPY WITH PROPOFOL;  Surgeon: Lollie Sails, MD;  Location: [facility] ENDOSCOPY;  Service: Endoscopy;  Laterality: N/A;  ? COLONOSCOPY WITH PROPOFOL N/A 11/02/2017  ? Procedure: COLONOSCOPY WITH PROPOFOL;  Surgeon: Lollie Sails, MD;  Location: [facility] ENDOSCOPY;  Service: Endoscopy;  Laterality: N/A;  ? COLONOSCOPY WITH PROPOFOL N/A 02/28/2020  ? Procedure: COLONOSCOPY WITH PROPOFOL;  Surgeon: Jonathon Bellows, MD;  Location: [facility] ENDOSCOPY;  Service: Gastroenterology;  Laterality: N/A;  ? COLONOSCOPY WITH PROPOFOL N/A 10/14/2021  ? Procedure: COLONOSCOPY WITH PROPOFOL;  Surgeon: Jonathon Bellows, MD;  Location: [facility] ENDOSCOPY;  Service: Gastroenterology;  Laterality: N/A;  ? ESOPHAGOGASTRODUODENOSCOPY (EGD) WITH PROPOFOL N/A 08/14/2015  ? Procedure: ESOPHAGOGASTRODUODENOSCOPY (EGD) WITH PROPOFOL;  Surgeon: Lollie Sails, MD;  Location: [facility] ENDOSCOPY;  Service: Endoscopy;  Laterality: N/A;  ? WISDOM TOOTH EXTRACTION    ? ?Family History  ?Problem Relation Age of Onset  ? Cancer Sister   ?     lymphoma dx 54s; currently 37  ? Diabetes Sister   ? Cirrhosis Sister   ? Colon cancer Paternal Uncle   ?     dx 19s; deceased 89  ? Prostate cancer Paternal Uncle   ?     dx 35s; currently 94  ? Dementia Maternal Aunt 85  ? Thyroid cancer Neg Hx   ? ? ?Allergies: Patient has no allergy information on record. ?Current Outpatient Medications on File Prior to Visit  ?Medication Sig Dispense  Refill  ? albuterol (VENTOLIN HFA) 108 (90 Base) MCG/ACT inhaler Inhale 2 puffs into the lungs every 6 (six) hours as needed for wheezing or shortness of breath. 1 each 2  ? aspirin EC 81 MG tablet Take 1 tablet (81 mg total) by mouth daily. 90 tablet 3  ? glucose blood test strip Use as instructed 50 each 12  ? ?No current facility-administered medications on file prior to visit.  ? ? ?Social History  ? ?Tobacco Use  ? Smoking status: Former  ?  Packs/day: 1.75  ?  Years: 30.00  ?  Pack years: 52.50  ?  Types: Cigarettes  ?  Quit date: 06/13/2012  ?  Years since quitting: 9.5  ? Smokeless  tobacco: Never  ?Vaping Use  ? Vaping Use: Former  ?Substance Use Topics  ? Alcohol use: No  ?  Alcohol/week: 0.0 standard drinks  ? Drug use: No  ? ? ?Review of Systems  ?Constitutional:  Negative for chills and fever.  ?Respiratory:  Negative for cough.   ?Cardiovascular:  Negative for chest pain and palpitations.  ?Gastrointestinal:  Negative for nausea and vomiting.  ?Genitourinary:  Negative for difficulty urinating.  ?Musculoskeletal:  Positive for back pain.  ?Neurological:  Negative for numbness.  ?   ?Objective:  ?  ?BP 136/76 (BP Location: Left Arm, Patient Position: Sitting, Cuff Size: Normal)   Pulse 75   Temp 97.7 ?F (36.5 ?C) (Oral)   Ht '5\' 11"'$  (1.803 m)   Wt (!) 412 lb 12.8 oz (187.2 kg)   SpO2 95%   BMI 57.57 kg/m?  ?BP Readings from Last 3 Encounters:  ?12/20/21 136/76  ?10/14/21 137/69  ?09/17/21 122/70  ? ?Wt Readings from Last 3 Encounters:  ?12/20/21 (!) 412 lb 12.8 oz (187.2 kg)  ?10/14/21 (!) 398 lb (180.5 kg)  ?09/17/21 (!) 399 lb (181 kg)  ? ? ?Physical Exam ?Vitals reviewed.  ?Constitutional:   ?   Appearance: He is well-developed.  ?Cardiovascular:  ?   Rate and Rhythm: Regular rhythm.  ?   Heart sounds: Normal heart sounds.  ?   Comments: Trace BLE non pitting pedal edema. No palpable cords or masses. No erythema or increased warmth. ?No asymmetry in calf size when compared bilaterally ? ? ?Pulmonary:  ?   Effort: Pulmonary effort is normal. No respiratory distress.  ?   Breath sounds: Normal breath sounds. No wheezing or rales.  ?Musculoskeletal:  ?   Thoracic back: No swelling, edema, spasms, tenderness or bony tenderness. Normal range of motion.  ?   Lumbar back: No swelling, spasms or tenderness. Normal range of motion.  ?     Back: ? ?   Comments: Patient describes midthoracic vertebral pain as marked on diagram.  No bony tenderness or tenderness elicited on exam.  Full range of motion with flexion, extension, lateral side bends. No pain, numbness, tingling elicited with single  leg raise bilaterally. No rash.  ?Skin: ?   General: Skin is warm and dry.  ?Neurological:  ?   Mental Status: He is alert.  ?Psychiatric:     ?   Speech: Speech normal.     ?   Behavior: Behavior normal.  ? ? ?   ?Assessment & Plan:  ? ?Problem List Items Addressed This Visit   ? ?  ? Cardiovascular and Mediastinum  ? Atherosclerosis of aorta ([place])  ?  Lab Results  ?Component Value Date  ? LDLCALC 45 04/05/2021  ?Chronic, stable.  Continue Crestor  10 mg ?  ?  ? Relevant Medications  ? torsemide 40 MG TABS  ? losartan (COZAAR) 25 MG tablet  ? rosuvastatin (CRESTOR) 10 MG tablet  ? verapamil (CALAN-SR) 180 MG CR tablet  ?  ? Respiratory  ? COPD (chronic obstructive pulmonary disease) ([place]) - Primary  ?  Chronic, overall shortness of breath described at baseline today.  Trelegy not approved by insurance.  Resume Advair 500-50 mcg as previously dose effective for patient.  Will discuss follow up with Carolynn Serve, Dr Mortimer Fries at follow up.  ? ?  ?  ? Relevant Medications  ? fluticasone-salmeterol (ADVAIR DISKUS) 500-50 MCG/ACT AEPB  ?  ? Endocrine  ? Diabetes mellitus without complication ([place])  ? Relevant Medications  ? losartan (COZAAR) 25 MG tablet  ? rosuvastatin (CRESTOR) 10 MG tablet  ? metFORMIN (GLUCOPHAGE-XR) 500 MG 24 hr tablet  ? Dulaglutide (TRULICITY) 5.44 BE/0.1EO SOPN  ? Other Relevant Orders  ? VAS [place] ABI WITH/WO TBI  ? Type 2 diabetes mellitus with hyperlipidemia ([place])  ?  Lab Results  ?Component Value Date  ? HGBA1C 7.3 (A) 12/20/2021  ?Uncontrolled.  Previously tried [employer] but insurance would not cover.  Counseled on black box warning as relates to medullary thyroid cancer, multiple endocrine neoplasia.  We opted to start trulicity.  ?Diminished bilateral pedal pulses,pending ABI. ?  ?  ? Relevant Medications  ? torsemide 40 MG TABS  ? losartan (COZAAR) 25 MG tablet  ? rosuvastatin (CRESTOR) 10 MG tablet  ? metFORMIN (GLUCOPHAGE-XR) 500 MG 24 hr tablet  ? verapamil (CALAN-SR) 180 MG CR tablet  ?  Dulaglutide (TRULICITY) 7.12 RF/7.5OI SOPN  ? Other Relevant Orders  ? POCT HgB A1C (Completed)  ?  ? Other  ? Chronic back pain  ?  Patient describes thoracic back pain.  He is compliant with Cymbalta 60 mg.  Pending

## 2021-12-20 NOTE — Assessment & Plan Note (Addendum)
Lab Results  ?Component Value Date  ? HGBA1C 7.3 (A) 12/20/2021  ?Uncontrolled.  Previously tried [employer] but insurance would not cover.  Counseled on black box warning as relates to medullary thyroid cancer, multiple endocrine neoplasia.  We opted to start trulicity.  ?Diminished bilateral pedal pulses,pending ABI. ?

## 2021-12-20 NOTE — Patient Instructions (Addendum)
Start Trulicity. This is a ONCE per week injectable which increases insulin secretion and delays gastric emptying (helping you feel full longer which is a good thing).  You do NOT have to take with food.  ? ?Common reaction are GI upset ( nausea) however this tends to get better with time. ? ?As discussed, this Medicare patient carries a black box warning may not take this medication with a personal or family history of medullary thyroid cancer, multiple endocrine  neoplasia ( MEN).  ? ?In 4 weeks time we will increase Trulicity to 1.5 mg once a week. ? ?Dulaglutide Injection ?What is this medication? ?DULAGLUTIDE (DOO la GLOO tide) controls blood sugar in people with type 2 diabetes. It is used with lifestyle changes like diet and exercise. It may lower the risk of problems that need treatment in the hospital. These problemsinclude heart attack or stroke. ?This medicine may be used for other purposes; ask your health care provider orpharmacist if you have questions. ?COMMON BRAND NAME(S): Trulicity ?What should I tell my care team before I take this medication? ?They need to know if you have any of these conditions: ?endocrine tumors (MEN 2) or if someone in your family had these tumors ?eye disease, vision problems ?history of pancreatitis ?kidney disease ?liver disease ?stomach or intestine problems ?thyroid cancer or if someone in your family had thyroid cancer ?an unusual or allergic reaction to dulaglutide, other medicines, foods, dyes, or preservatives ?pregnant or trying to get pregnant ?breast-feeding ?How should I use this medication? ?This medicine is injected under the skin. You will be taught how to prepare and give it. Take it as directed on the prescription label on the same day of each week. Do NOT prime the pen. Keep taking it unless your health care providertells you to stop. ?If you use this medicine with insulin, you should inject this medicine and the insulin separately. Do not mix them  together. Do not give the injections rightnext to each other. Change (rotate) injection sites with each injection. ?This drug comes with INSTRUCTIONS FOR USE. Ask your pharmacist for directions on how to use this medicine. Read the information carefully. Talk to yourpharmacist or health care provider if you have questions. ?It is important that you put your used needles and syringes in a special sharps container. Do not put them in a trash can. If you do not have a sharpscontainer, call your pharmacist or health care provider to get one. ?A special MedGuide will be given to you by the pharmacist with eachprescription and refill. Be sure to read this information carefully each time. ?Talk to your health care provider about the use of this medicine in children.Special care may be needed. ?Overdosage: If you think you have taken too much of this medicine contact apoison control center or emergency room at once. ?NOTE: This medicine is only for you. Do not share this medicine with others. ?What if I miss a dose? ?If you miss a dose, take it as soon as you can unless it is more than 3 days late. If it is more than 3 days late, skip the missed dose. Take the next doseat the normal time. ?What may interact with this medication? ?other medicines for diabetes ?Many medications may cause changes in blood sugar, these include: ?alcohol containing beverages ?antiviral medicines for HIV or AIDS ?aspirin and aspirin-like drugs ?certain medicines for blood pressure, heart disease, irregular heart beat ?chromium ?diuretics ?male hormones, such as estrogens or progestins, birth control pills ?fenofibrate ?gemfibrozil ?  isoniazid ?lanreotide ?male hormones or anabolic steroids ?MAOIs like Carbex, Eldepryl, Marplan, Nardil, and Parnate ?medicines for allergies, asthma, cold, or cough ?medicines for depression, anxiety, or psychotic disturbances ?medicines for weight loss ?niacin ?nicotine ?NSAIDs, medicines for pain and inflammation,  like ibuprofen or naproxen ?octreotide ?pasireotide ?pentamidine ?phenytoin ?probenecid ?quinolone antibiotics such as ciprofloxacin, levofloxacin, ofloxacin ?some herbal dietary supplements ?steroid medicines such as prednisone or cortisone ?sulfamethoxazole; trimethoprim ?thyroid hormones ?Some medications can hide the warning symptoms of low blood sugar (hypoglycemia). You may need to monitor your blood sugar more closely if youare taking one of these medications. These include: ?beta-blockers, often used for high blood pressure or heart problems (examples include atenolol, metoprolol, propranolol) ?clonidine ?guanethidine ?reserpine ?This list may not describe all possible interactions. Give your health care provider a list of all the medicines, herbs, non-prescription drugs, or dietary supplements you use. Also tell them if you smoke, drink alcohol, or use illegaldrugs. Some items may interact with your medicine. ?What should I watch for while using this medication? ?Visit your health care provider for regular checks on your progress. ?Check with your health care provider if you have severe diarrhea, nausea, and vomiting, or if you sweat a lot. The loss of too much body fluid may make itdangerous for you to take this medicine. ?A test called the HbA1C (A1C) will be monitored. This is a simple blood test. It measures your blood sugar control over the last 2 to 3 months. You willreceive this test every 3 to 6 months. ?Learn how to check your blood sugar. Learn the symptoms of low and high bloodsugar and how to manage them. ?Always carry a quick-source of sugar with you in case you have symptoms of low blood sugar. Examples include hard sugar candy or glucose tablets. Make sure others know that you can choke if you eat or drink when you develop serious symptoms of low blood sugar, such as seizures or unconsciousness. Get medicalhelp at once. ?Tell your health care provider if you have high blood sugar. You might  need to change the dose of your medicine. If you are sick or exercising more thanusual, you may need to change the dose of your medicine. ?Do not skip meals. Ask your health care provider if you should avoid alcohol. Many nonprescription cough and cold products contain sugar or alcohol. Thesecan affect blood sugar. ?Pens should never be shared. Even if the needle is changed, sharing may resultin passing of viruses like hepatitis or HIV. ?Wear a medical ID bracelet or chain. Carry a card that describes yourcondition. List the medicines and doses you take on the card. ?What side effects may I notice from receiving this medication? ?Side effects that you should report to your doctor or health care professionalas soon as possible: ?allergic reactions (skin rash, itching or hives; swelling of the face, lips, or tongue) ?changes in vision ?diarrhea that continues or is severe ?infection (fever, chills, cough, sore throat, pain or trouble passing urine) ?kidney injury (trouble passing urine or change in the amount of urine) ?low blood sugar (feeling anxious; confusion; dizziness; increased hunger; unusually weak or tired; increased sweating; shakiness; cold, clammy skin; irritable; headache; blurred vision; fast heartbeat; loss of consciousness) ?lump or swelling on the neck ?trouble breathing ?trouble swallowing ?unusual stomach upset or pain ?vomiting ?Side effects that usually do not require medical attention (report to yourdoctor or health care professional if they continue or are bothersome): ?lack or loss of appetite ?nausea ?pain, redness, or irritation at site where injected ?  This list may not describe all possible side effects. Call your doctor for medical advice about side effects. You may report side effects to FDA at1-800-FDA-1088. ?Where should I keep my medication? ?Keep out of the reach of children and pets. ?Refrigeration (preferred): Store unopened pens in a refrigerator between 2 and 8 degrees C (36 and  46 degrees F). Keep it in the original carton until you are ready to take it. Do not freeze or use if the medicine has been frozen. Protect from light. Get rid of any unused medicine after the expiration date

## 2021-12-20 NOTE — Telephone Encounter (Signed)
close

## 2021-12-21 LAB — COMPREHENSIVE METABOLIC PANEL
ALT: 28 U/L (ref 0–53)
AST: 26 U/L (ref 0–37)
Albumin: 4.5 g/dL (ref 3.5–5.2)
Alkaline Phosphatase: 58 U/L (ref 39–117)
BUN: 10 mg/dL (ref 6–23)
CO2: 24 mEq/L (ref 19–32)
Calcium: 9.6 mg/dL (ref 8.4–10.5)
Chloride: 100 mEq/L (ref 96–112)
Creatinine, Ser: 0.87 mg/dL (ref 0.40–1.50)
GFR: 96.24 mL/min (ref >60.00)
Glucose, Bld: 140 mg/dL — ABNORMAL HIGH (ref 70–99)
Potassium: 4.2 mEq/L (ref 3.5–5.1)
Sodium: 138 mEq/L (ref 135–145)
Total Bilirubin: 0.4 mg/dL (ref 0.2–1.2)
Total Protein: 7.7 g/dL (ref 6.0–8.3)

## 2021-12-21 NOTE — Assessment & Plan Note (Signed)
Lab Results  ?Component Value Date  ? LDLCALC 45 04/05/2021  ? ?Chronic, stable.  Continue Crestor 10 mg ?

## 2021-12-21 NOTE — Assessment & Plan Note (Signed)
Patient describes thoracic back pain.  He is compliant with Cymbalta 60 mg.  Pending baseline thoracic and lumbar x-ray.  Advised scheduling Tylenol arthritis, lidocaine pain patch.  Will await x-rays and consider referral to pain management for further evaluation, treatment if symptom persists.  ?

## 2021-12-21 NOTE — Assessment & Plan Note (Signed)
Chronic, overall shortness of breath described at baseline today.  Trelegy not approved by insurance.  Resume Advair 500-50 mcg as previously dose effective for patient.  Will discuss follow up with Carolynn Serve, Dr Mortimer Fries at follow up.  ?

## 2022-01-12 ENCOUNTER — Other Ambulatory Visit: Payer: Self-pay | Admitting: Family

## 2022-01-12 DIAGNOSIS — M7989 Other specified soft tissue disorders: Secondary | ICD-10-CM

## 2022-01-26 ENCOUNTER — Ambulatory Visit
Admission: RE | Admit: 2022-01-26 | Discharge: 2022-01-26 | Disposition: A | Discharge status: Home / Self Care | Payer: 59 | Attending: Family | Admitting: Family

## 2022-01-26 ENCOUNTER — Ambulatory Visit
Admission: RE | Admit: 2022-01-26 | Discharge: 2022-01-26 | Disposition: A | Discharge status: Home / Self Care | Payer: 59 | Source: Ambulatory Visit | Attending: Family | Admitting: Family

## 2022-01-26 ENCOUNTER — Ambulatory Visit (INDEPENDENT_AMBULATORY_CARE_PROVIDER_SITE_OTHER): Payer: 59

## 2022-01-26 DIAGNOSIS — M5136 Other intervertebral disc degeneration, lumbar region: Secondary | ICD-10-CM | POA: Diagnosis not present

## 2022-01-26 DIAGNOSIS — R600 Localized edema: Secondary | ICD-10-CM | POA: Diagnosis not present

## 2022-01-26 DIAGNOSIS — M546 Pain in thoracic spine: Secondary | ICD-10-CM | POA: Diagnosis not present

## 2022-01-26 DIAGNOSIS — M47816 Spondylosis without myelopathy or radiculopathy, lumbar region: Secondary | ICD-10-CM | POA: Diagnosis not present

## 2022-01-26 DIAGNOSIS — M549 Dorsalgia, unspecified: Secondary | ICD-10-CM | POA: Diagnosis not present

## 2022-01-26 DIAGNOSIS — G8929 Other chronic pain: Secondary | ICD-10-CM | POA: Diagnosis not present

## 2022-01-26 DIAGNOSIS — M47814 Spondylosis without myelopathy or radiculopathy, thoracic region: Secondary | ICD-10-CM | POA: Diagnosis not present

## 2022-01-26 DIAGNOSIS — M7989 Other specified soft tissue disorders: Secondary | ICD-10-CM

## 2022-01-26 DIAGNOSIS — M545 Low back pain, unspecified: Secondary | ICD-10-CM | POA: Diagnosis not present

## 2022-03-22 ENCOUNTER — Encounter: Payer: Self-pay | Admitting: Family

## 2022-03-22 ENCOUNTER — Ambulatory Visit (INDEPENDENT_AMBULATORY_CARE_PROVIDER_SITE_OTHER): Payer: 59 | Admitting: Family

## 2022-03-22 ENCOUNTER — Other Ambulatory Visit: Payer: Self-pay | Admitting: Family

## 2022-03-22 VITALS — BP 124/76 | HR 73 | Temp 97.6°F | Ht 71.0 in | Wt >= 6400 oz

## 2022-03-22 DIAGNOSIS — T502X5A Adverse effect of carbonic-anhydrase inhibitors, benzothiadiazides and other diuretics, initial encounter: Secondary | ICD-10-CM

## 2022-03-22 DIAGNOSIS — Z125 Encounter for screening for malignant neoplasm of prostate: Secondary | ICD-10-CM

## 2022-03-22 DIAGNOSIS — I10 Essential (primary) hypertension: Secondary | ICD-10-CM | POA: Diagnosis not present

## 2022-03-22 DIAGNOSIS — J449 Chronic obstructive pulmonary disease, unspecified: Secondary | ICD-10-CM

## 2022-03-22 DIAGNOSIS — K219 Gastro-esophageal reflux disease without esophagitis: Secondary | ICD-10-CM

## 2022-03-22 DIAGNOSIS — M7989 Other specified soft tissue disorders: Secondary | ICD-10-CM | POA: Diagnosis not present

## 2022-03-22 DIAGNOSIS — M1A2720 Drug-induced chronic gout, left ankle and foot, without tophus (tophi): Secondary | ICD-10-CM

## 2022-03-22 DIAGNOSIS — G8929 Other chronic pain: Secondary | ICD-10-CM

## 2022-03-22 DIAGNOSIS — E876 Hypokalemia: Secondary | ICD-10-CM | POA: Diagnosis not present

## 2022-03-22 DIAGNOSIS — E1169 Type 2 diabetes mellitus with other specified complication: Secondary | ICD-10-CM

## 2022-03-22 DIAGNOSIS — E119 Type 2 diabetes mellitus without complications: Secondary | ICD-10-CM | POA: Diagnosis not present

## 2022-03-22 DIAGNOSIS — I479 Paroxysmal tachycardia, unspecified: Secondary | ICD-10-CM

## 2022-03-22 DIAGNOSIS — M549 Dorsalgia, unspecified: Secondary | ICD-10-CM

## 2022-03-22 DIAGNOSIS — E785 Hyperlipidemia, unspecified: Secondary | ICD-10-CM

## 2022-03-22 LAB — LIPID PANEL
Cholesterol: 121 mg/dL (ref 0–200)
HDL: 45.4 mg/dL (ref >39.00)
LDL Cholesterol: 52 mg/dL (ref 0–99)
NonHDL: 75.43
Total CHOL/HDL Ratio: 3
Triglycerides: 116 mg/dL (ref 0.0–149.0)
VLDL: 23.2 mg/dL (ref 0.0–40.0)

## 2022-03-22 LAB — CBC WITH DIFFERENTIAL/PLATELET
Basophils Absolute: 0.2 10*3/uL — ABNORMAL HIGH (ref 0.0–0.1)
Basophils Relative: 1.9 % (ref 0.0–3.0)
Eosinophils Absolute: 0.2 10*3/uL (ref 0.0–0.7)
Eosinophils Relative: 2.7 % (ref 0.0–5.0)
HCT: 43.7 % (ref 39.0–52.0)
Hemoglobin: 14.2 g/dL (ref 13.0–17.0)
Lymphocytes Relative: 21.6 % (ref 12.0–46.0)
Lymphs Abs: 1.7 10*3/uL (ref 0.7–4.0)
MCHC: 32.6 g/dL (ref 30.0–36.0)
MCV: 82.1 fl (ref 78.0–100.0)
Monocytes Absolute: 0.7 10*3/uL (ref 0.1–1.0)
Monocytes Relative: 8.5 % (ref 3.0–12.0)
Neutro Abs: 5.3 10*3/uL (ref 1.4–7.7)
Neutrophils Relative %: 65.3 % (ref 43.0–77.0)
Platelets: 256 10*3/uL (ref 150.0–400.0)
RBC: 5.33 Mil/uL (ref 4.22–5.81)
RDW: 15.1 % (ref 11.5–15.5)
WBC: 8.1 10*3/uL (ref 4.0–10.5)

## 2022-03-22 LAB — POCT GLYCOSYLATED HEMOGLOBIN (HGB A1C): Hemoglobin A1C: 7.5 % — AB (ref 4.0–5.6)

## 2022-03-22 LAB — MICROALBUMIN / CREATININE URINE RATIO
Creatinine,U: 158.1 mg/dL
Microalb Creat Ratio: 0.7 mg/g (ref 0.0–30.0)
Microalb, Ur: 1.1 mg/dL (ref 0.0–1.9)

## 2022-03-22 LAB — COMPREHENSIVE METABOLIC PANEL
ALT: 30 U/L (ref 0–53)
AST: 29 U/L (ref 0–37)
Albumin: 4.3 g/dL (ref 3.5–5.2)
Alkaline Phosphatase: 50 U/L (ref 39–117)
BUN: 11 mg/dL (ref 6–23)
CO2: 30 mEq/L (ref 19–32)
Calcium: 9.3 mg/dL (ref 8.4–10.5)
Chloride: 99 mEq/L (ref 96–112)
Creatinine, Ser: 0.81 mg/dL (ref 0.40–1.50)
GFR: 98.17 mL/min (ref >60.00)
Glucose, Bld: 120 mg/dL — ABNORMAL HIGH (ref 70–99)
Potassium: 4.2 mEq/L (ref 3.5–5.1)
Sodium: 139 mEq/L (ref 135–145)
Total Bilirubin: 0.5 mg/dL (ref 0.2–1.2)
Total Protein: 7.3 g/dL (ref 6.0–8.3)

## 2022-03-22 LAB — PSA: PSA: 1.09 ng/mL (ref 0.10–4.00)

## 2022-03-22 MED ORDER — DULOXETINE HCL 60 MG PO CPEP: 60.0000 mg | ORAL_CAPSULE | Freq: Every day | ORAL | 3 refills | Status: DC

## 2022-03-22 MED ORDER — ROSUVASTATIN CALCIUM 10 MG PO TABS
10.0000 mg | ORAL_TABLET | Freq: Every day | ORAL | 3 refills | Status: DC
Start: 2022-03-22 — End: 2023-05-22

## 2022-03-22 MED ORDER — VERAPAMIL HCL ER 180 MG PO TBCR: 180.0000 mg | EXTENDED_RELEASE_TABLET | Freq: Two times a day (BID) | ORAL | 3 refills | Status: DC

## 2022-03-22 MED ORDER — POTASSIUM CHLORIDE CRYS ER 20 MEQ PO TBCR: 40.0000 meq | EXTENDED_RELEASE_TABLET | Freq: Every day | ORAL | 0 refills | Status: DC

## 2022-03-22 MED ORDER — TRULICITY 1.5 MG/0.5ML ~~LOC~~ SOAJ: 1.5000 mg | SUBCUTANEOUS | 2 refills | Status: DC

## 2022-03-22 MED ORDER — LOSARTAN POTASSIUM 25 MG PO TABS: 25.0000 mg | ORAL_TABLET | Freq: Every day | ORAL | 3 refills | Status: DC

## 2022-03-22 MED ORDER — PANTOPRAZOLE SODIUM 40 MG PO TBEC: 40.0000 mg | DELAYED_RELEASE_TABLET | Freq: Every day | ORAL | 3 refills | Status: DC

## 2022-03-22 MED ORDER — METFORMIN HCL ER 500 MG PO TB24
2000.0000 mg | ORAL_TABLET | Freq: Every evening | ORAL | 3 refills | Status: DC
Start: 2022-03-22 — End: 2022-10-18

## 2022-03-22 MED ORDER — FLUTICASONE-SALMETEROL 500-50 MCG/ACT IN AEPB: 1.0000 | INHALATION_SPRAY | Freq: Two times a day (BID) | RESPIRATORY_TRACT | 3 refills | Status: DC

## 2022-03-22 MED ORDER — TORSEMIDE 40 MG PO TABS: 40.0000 mg | ORAL_TABLET | Freq: Every day | ORAL | 0 refills | Status: DC

## 2022-03-22 MED ORDER — ALLOPURINOL 100 MG PO TABS
100.0000 mg | ORAL_TABLET | Freq: Two times a day (BID) | ORAL | 3 refills | Status: DC
Start: 2022-03-22 — End: 2022-10-18

## 2022-03-22 NOTE — Assessment & Plan Note (Signed)
Chronic, stable.  Patient politely declines any further evaluation at this time.  He will continue taking Tylenol arthritis 2 tablets nightly, Cymbalta 60 mg daily.  He will let me know if back pain were to worsen in any way

## 2022-03-22 NOTE — Assessment & Plan Note (Addendum)
Slightly uncontrolled.  Discussed goal of A1c 6.5.  Increase Trulicity to 1.'5mg'$ .  Continue metformin '2000mg'$  qd.  He will schedule his eye exam

## 2022-03-22 NOTE — Assessment & Plan Note (Signed)
Chronic, symptomatically stable.  Shortness of breath has improved on Advair 500-50 mcg.

## 2022-03-22 NOTE — Progress Notes (Signed)
Subjective:    Patient ID: Michael Boyle, male    DOB: [birth year], 56 y.o.   MRN: [id number]  CC: Michael Boyle is a 56 y.o. male who presents today for follow up.   HPI: Feels well today.  No new complaints   Hyperlipidemia-compliant with Crestor 10 mg COPD-resumed Advair 500-50 mcg at previous visit and he feels shortness of breath has improved.  Leg swelling has improved.  He has not needed to take torsemide over the summer.   DM-compliant with Trulicity 0.'75mg'$  x wks,  metformin 2000 mg/day  Low back pain-Pain is not particularly bothersome at this time. no numbness, leg weakness. he is taking tylenol arthritis 2 tablets qhs with relief. compliant with Cymbalta 60 mg.  Mild degenerative changes of the lumbar spine x-ray; degenerative changes of thoracic spine noted as well 01/26/2022  No decreased urine stream or trouble urinating.  HISTORY:  Past Medical History:  Diagnosis Date   Colon polyps    COPD (chronic obstructive pulmonary disease) ([place])    Diabetes mellitus with complication (HCC)    Genetic testing 12/07/2017   Common Cancers panel (47 genes) @ [facility] - No pathogenic mutations detected   GERD (gastroesophageal reflux disease)    Gout    Incomplete left bundle branch block    Incomplete left bundle branch block (LBBB)    a. TTE 4/17: EF of 60-65%, unable to assess wall motion, normal LV diastolic function parameters, mildly calcifief mitral annulus   Morbid obesity (HCC)    Palpitations    a. felt to be pSVT   Palpitations    Venous insufficiency    Past Surgical History:  Procedure Laterality Date   COLONOSCOPY WITH PROPOFOL N/A 08/14/2015   Procedure: COLONOSCOPY WITH PROPOFOL;  Surgeon: Lollie Sails, MD;  Location: [facility] ENDOSCOPY;  Service: Endoscopy;  Laterality: N/A;   COLONOSCOPY WITH PROPOFOL N/A 11/02/2017   Procedure: COLONOSCOPY WITH PROPOFOL;  Surgeon: Lollie Sails, MD;  Location: [facility] ENDOSCOPY;  Service: Endoscopy;  Laterality: N/A;    COLONOSCOPY WITH PROPOFOL N/A 02/28/2020   Procedure: COLONOSCOPY WITH PROPOFOL;  Surgeon: Jonathon Bellows, MD;  Location: [facility] ENDOSCOPY;  Service: Gastroenterology;  Laterality: N/A;   COLONOSCOPY WITH PROPOFOL N/A 10/14/2021   Procedure: COLONOSCOPY WITH PROPOFOL;  Surgeon: Jonathon Bellows, MD;  Location: [facility] ENDOSCOPY;  Service: Gastroenterology;  Laterality: N/A;   ESOPHAGOGASTRODUODENOSCOPY (EGD) WITH PROPOFOL N/A 08/14/2015   Procedure: ESOPHAGOGASTRODUODENOSCOPY (EGD) WITH PROPOFOL;  Surgeon: Lollie Sails, MD;  Location: [facility] ENDOSCOPY;  Service: Endoscopy;  Laterality: N/A;   WISDOM TOOTH EXTRACTION     Family History  Problem Relation Age of Onset   Cancer Sister        lymphoma dx 1s; currently 35   Diabetes Sister    Cirrhosis Sister    Colon cancer Paternal Uncle        dx 75s; deceased 80   Prostate cancer Paternal Uncle        dx 77s; currently 51   Dementia Maternal Aunt 85   Thyroid cancer Neg Hx     Allergies: Patient has no allergy information on record. Current Outpatient Medications on File Prior to Visit  Medication Sig Dispense Refill   albuterol (VENTOLIN HFA) 108 (90 Base) MCG/ACT inhaler Inhale 2 puffs into the lungs every 6 (six) hours as needed for wheezing or shortness of breath. 1 each 2   aspirin EC 81 MG tablet Take 1 tablet (81 mg total) by mouth daily. 90 tablet 3  glucose blood test strip Use as instructed 50 each 12   No current facility-administered medications on file prior to visit.    Social History   Tobacco Use   Smoking status: Former    Packs/day: 1.75    Years: 30.00    Total pack years: 52.50    Types: Cigarettes    Quit date: 06/13/2012    Years since quitting: 9.7   Smokeless tobacco: Never  Vaping Use   Vaping Use: Former  Substance Use Topics   Alcohol use: No    Alcohol/week: 0.0 standard drinks of alcohol   Drug use: No    Review of Systems  Constitutional:  Negative for chills and fever.  Respiratory:  Negative for  cough.   Cardiovascular:  Negative for chest pain and palpitations.  Gastrointestinal:  Negative for nausea and vomiting.  Genitourinary:  Negative for difficulty urinating.  Musculoskeletal:  Positive for back pain.  Neurological:  Negative for numbness.      Objective:    BP 124/76 (BP Location: Left Arm, Patient Position: Sitting, Cuff Size: Normal)   Pulse 73   Temp 97.6 F (36.4 C) (Oral)   Ht '5\' 11"'$  (1.803 m)   Wt (!) 412 lb 6.4 oz (187.1 kg)   SpO2 96%   BMI 57.52 kg/m  BP Readings from Last 3 Encounters:  03/22/22 124/76  12/20/21 136/76  10/14/21 137/69   Wt Readings from Last 3 Encounters:  03/22/22 (!) 412 lb 6.4 oz (187.1 kg)  12/20/21 (!) 412 lb 12.8 oz (187.2 kg)  10/14/21 (!) 398 lb (180.5 kg)    Physical Exam Vitals reviewed.  Constitutional:      Appearance: He is well-developed.  Cardiovascular:     Rate and Rhythm: Regular rhythm.     Heart sounds: Normal heart sounds.  Pulmonary:     Effort: Pulmonary effort is normal. No respiratory distress.     Breath sounds: Normal breath sounds. No wheezing, rhonchi or rales.  Skin:    General: Skin is warm and dry.  Neurological:     Mental Status: He is alert.  Psychiatric:        Speech: Speech normal.        Behavior: Behavior normal.        Assessment & Plan:   Problem List Items Addressed This Visit       Respiratory   COPD (chronic obstructive pulmonary disease) (HCC)    Chronic, symptomatically stable.  Shortness of breath has improved on Advair 500-50 mcg.      Relevant Medications   fluticasone-salmeterol (ADVAIR DISKUS) 500-50 MCG/ACT AEPB     Endocrine   Diabetes mellitus without complication (HCC) - Primary   Relevant Medications   Dulaglutide (TRULICITY) 1.5 ZC/5.8IF SOPN   losartan (COZAAR) 25 MG tablet   rosuvastatin (CRESTOR) 10 MG tablet   metFORMIN (GLUCOPHAGE-XR) 500 MG 24 hr tablet   Other Relevant Orders   POCT HgB A1C (Completed)   Lipid panel   Microalbumin /  creatinine urine ratio   Comprehensive metabolic panel   CBC with Differential/Platelet   Type 2 diabetes mellitus with hyperlipidemia (HCC)    Slightly uncontrolled.  Discussed goal of A1c 6.5.  Increase Trulicity to 1.'5mg'$ .  Continue metformin '2000mg'$  qd.  He will schedule his eye exam      Relevant Medications   Dulaglutide (TRULICITY) 1.5 OY/7.7AJ SOPN   Torsemide 40 MG TABS   losartan (COZAAR) 25 MG tablet   rosuvastatin (CRESTOR) 10 MG tablet  metFORMIN (GLUCOPHAGE-XR) 500 MG 24 hr tablet   verapamil (CALAN-SR) 180 MG CR tablet     Other   Chronic back pain    Chronic, stable.  Patient politely declines any further evaluation at this time.  He will continue taking Tylenol arthritis 2 tablets nightly, Cymbalta 60 mg daily.  He will let me know if back pain were to worsen in any way      Relevant Medications   DULoxetine (CYMBALTA) 60 MG capsule   Gout   Relevant Medications   allopurinol (ZYLOPRIM) 100 MG tablet   Other Visit Diagnoses     Diuretic-induced hypokalemia       Relevant Medications   potassium chloride SA (KLOR-CON M) 20 MEQ tablet   Leg swelling       Relevant Medications   Torsemide 40 MG TABS   Benign essential hypertension       Relevant Medications   Torsemide 40 MG TABS   losartan (COZAAR) 25 MG tablet   rosuvastatin (CRESTOR) 10 MG tablet   verapamil (CALAN-SR) 180 MG CR tablet   Gastroesophageal reflux disease       Relevant Medications   pantoprazole (PROTONIX) 40 MG tablet   Tachycardia, paroxysmal (HCC)       Relevant Medications   Torsemide 40 MG TABS   losartan (COZAAR) 25 MG tablet   rosuvastatin (CRESTOR) 10 MG tablet   verapamil (CALAN-SR) 180 MG CR tablet   Screening for prostate cancer       Relevant Orders   PSA        I have discontinued Michael Boyle's Trulicity. I am also having him start on Trulicity. Additionally, I am having him maintain his glucose blood, albuterol, aspirin EC, potassium chloride SA, Torsemide,  losartan, rosuvastatin, DULoxetine, fluticasone-salmeterol, metFORMIN, allopurinol, pantoprazole, and verapamil.   Meds ordered this encounter  Medications   Dulaglutide (TRULICITY) 1.5 TD/3.2KG SOPN    Sig: Inject 1.5 mg into the skin once a week.    Dispense:  2 mL    Refill:  2    Order Specific Question:   Supervising Provider    Answer:   Deborra Medina L [[id number]]   potassium chloride SA (KLOR-CON M) 20 MEQ tablet    Sig: Take 2 tablets (40 mEq total) by mouth daily.    Dispense:  180 tablet    Refill:  0    Order Specific Question:   Supervising Provider    Answer:   Deborra Medina L [[id number]]   Torsemide 40 MG TABS    Sig: Take 40 mg by mouth daily.    Dispense:  90 tablet    Refill:  0    Order Specific Question:   Supervising Provider    Answer:   Deborra Medina L [[id number]]   losartan (COZAAR) 25 MG tablet    Sig: Take 1 tablet (25 mg total) by mouth daily.    Dispense:  90 tablet    Refill:  3    Order Specific Question:   Supervising Provider    Answer:   Deborra Medina L [[id number]]   rosuvastatin (CRESTOR) 10 MG tablet    Sig: Take 1 tablet (10 mg total) by mouth daily.    Dispense:  90 tablet    Refill:  3    Order Specific Question:   Supervising Provider    Answer:   Deborra Medina L [[id number]]   DULoxetine (CYMBALTA) 60 MG capsule    Sig: Take 1 capsule (60 mg total)  by mouth daily.    Dispense:  90 capsule    Refill:  3    Order Specific Question:   Supervising Provider    Answer:   Deborra Medina L [[id number]]   fluticasone-salmeterol (ADVAIR DISKUS) 500-50 MCG/ACT AEPB    Sig: Inhale 1 puff into the lungs in the morning and at bedtime.    Dispense:  3 each    Refill:  3    Order Specific Question:   Supervising Provider    Answer:   Deborra Medina L [[id number]]   metFORMIN (GLUCOPHAGE-XR) 500 MG 24 hr tablet    Sig: Take 4 tablets (2,000 mg total) by mouth every evening.    Dispense:  360 tablet    Refill:  3    Order Specific Question:   Supervising Provider    Answer:   Deborra Medina L [[id number]]   allopurinol (ZYLOPRIM) 100 MG tablet    Sig: Take 1 tablet (100 mg total) by mouth 2 (two) times daily.    Dispense:  180 tablet    Refill:  3    Order Specific Question:   Supervising Provider    Answer:   Deborra Medina L [[id number]]   pantoprazole (PROTONIX) 40 MG tablet    Sig: Take 1 tablet (40 mg total) by mouth daily.    Dispense:  90 tablet    Refill:  3    Order Specific Question:   Supervising Provider    Answer:   Deborra Medina L [[id number]]   verapamil (CALAN-SR) 180 MG CR tablet    Sig: Take 1 tablet (180 mg total) by mouth 2 (two) times daily.    Dispense:  180 tablet    Refill:  3    Order Specific Question:   Supervising Provider    Answer:   Crecencio Mc [[id number]]    Return precautions given.   Risks, benefits, and alternatives of the medications and treatment plan prescribed today were discussed, and patient expressed understanding.   Education regarding symptom management and diagnosis given to patient on AVS.  Continue to follow with Burnard Hawthorne, FNP for routine health maintenance.   Louann Liv and I agreed with plan.   Mable Paris, FNP

## 2022-03-22 NOTE — Patient Instructions (Signed)
Increase trulicity to 1.'5mg'$ 

## 2022-03-23 ENCOUNTER — Other Ambulatory Visit: Payer: Self-pay | Admitting: Family

## 2022-03-23 DIAGNOSIS — R972 Elevated prostate specific antigen [PSA]: Secondary | ICD-10-CM

## 2022-03-23 MED ORDER — FUROSEMIDE 20 MG PO TABS: 20.0000 mg | ORAL_TABLET | Freq: Every day | ORAL | 1 refills | Status: DC | PRN

## 2022-03-23 NOTE — Telephone Encounter (Signed)
Call patient Got a notice from [facility] that torsemide was not approved.  Suggested Lasix.  I have stopped torsemide 40 mg and sent in Lasix 20 mg to be used as needed.  It also looks like Trulicity may not be approved . I sent the medication back in as wasn't sure. Please ask [place] to call us if any issues in getting Trulicity

## 2022-06-11 ENCOUNTER — Other Ambulatory Visit: Payer: Self-pay | Admitting: Family

## 2022-06-11 DIAGNOSIS — T502X5A Adverse effect of carbonic-anhydrase inhibitors, benzothiadiazides and other diuretics, initial encounter: Secondary | ICD-10-CM

## 2022-06-20 ENCOUNTER — Ambulatory Visit: Payer: Self-pay | Admitting: Surgery

## 2022-06-20 NOTE — H&P (View-Only) (Signed)
Subjective:    CC: Epidermal cyst of neck [L72.0]   HPI:  Michael Boyle is a 56 y.o. male who presents for evaluation of above. First noted several days ago.  Symptoms include: Pain is sharp, localized.  Exacerbated by touch.  Alleviated by spontaneous drainage.  Associated with drainage.  Hx of same issues in the spot few years ago, s/p excision.     Past Medical History:  has a past medical history of COPD (chronic obstructive pulmonary disease) (CMS-HCC), Gastritis (08/14/2015), GERD (gastroesophageal reflux disease), Gout, joint, History of myocardial infarction, Hyperplastic colon polyp (08/14/2015), OSA (obstructive sleep apnea), SVT (supraventricular tachycardia), and Tubular adenoma of colon (08/14/2015).   Past Surgical History:  has a past surgical history that includes Colonoscopy (08/14/2015); egd (08/14/2015); and Colonoscopy (11/02/2017).   Family History: family history includes Liver disease in his sister; Lung disease in his father; Lymphoma in his sister; No Known Problems in his mother.   Social History:  reports that he has quit smoking. He has never used smokeless tobacco. He reports that he does not drink alcohol and does not use drugs.   Current Medications: has a current medication list which includes the following prescription(s): albuterol, allopurinol, aspirin, doxycycline, duloxetine, fluticasone propion-salmeterol, furosemide, losartan, metformin, pantoprazole, potassium chloride, rosuvastatin, verapamil, and torsemide.   Allergies:  No Known Allergies   ROS:  A 15 point review of systems was performed and pertinent positives and negatives noted in HPI   Objective:    BP (!) 191/92   Pulse 73   Ht 180.3 cm ('5\' 11"'$ )   Wt (!) 188.9 kg (416 lb 7.2 oz)   BMI 58.08 kg/m    Constitutional :  No distress, cooperative, alert  Lymphatics/Throat:  Supple with no lymphadenopathy  Respiratory:  Clear to auscultation bilaterally  Cardiovascular:  Regular rate and  rhythm  Gastrointestinal: Soft, non-tender, non-distended, no organomegaly.  Musculoskeletal: Steady gait and movement  Skin: Cool and moist, infected sebaceous cyst with caseous discharge on exam, left neck.  Has asymptomatic epidermal cyst on right side  Psychiatric: Normal affect, non-agitated, not confused           LABS:  N/a    RADS: N/a   Assessment:       Epidermal cyst of neck [L72.0] Resolving on left, asymptomatic non-infected one on right.  Pt requesting removal of both at same time. Plan:    1. Epidermal cyst of neck [L72.0] Discussed surgical excision.  Alternatives include continued observation.  Benefits include possible symptom relief, pathologic evaluation, improved cosmesis. Discussed the risk of surgery including recurrence, chronic pain, post-op infxn, poor cosmesis, poor/delayed wound healing, and possible re-operation to address said risks. The risks of general anesthetic, if used, includes MI, CVA, sudden death or even reaction to anesthetic medications also discussed.  Typical post-op recovery time of 3-5 days with possible activity restrictions were also discussed.   The patient verbalized understanding and all questions were answered to the patient's satisfaction.   2. Patient has elected to proceed with surgical treatment. Procedure will be scheduled, in OR due to location, number, and repeat procedure. Supine, head turned away as needed   labs/images/medications/previous chart entries reviewed personally and relevant changes/updates noted above.

## 2022-06-20 NOTE — H&P (Signed)
Subjective:    CC: Epidermal cyst of neck [L72.0]   HPI:  Michael Boyle is a 56 y.o. male who presents for evaluation of above. First noted several days ago.  Symptoms include: Pain is sharp, localized.  Exacerbated by touch.  Alleviated by spontaneous drainage.  Associated with drainage.  Hx of same issues in the spot few years ago, s/p excision.     Past Medical History:  has a past medical history of COPD (chronic obstructive pulmonary disease) (CMS-HCC), Gastritis (08/14/2015), GERD (gastroesophageal reflux disease), Gout, joint, History of myocardial infarction, Hyperplastic colon polyp (08/14/2015), OSA (obstructive sleep apnea), SVT (supraventricular tachycardia), and Tubular adenoma of colon (08/14/2015).   Past Surgical History:  has a past surgical history that includes Colonoscopy (08/14/2015); egd (08/14/2015); and Colonoscopy (11/02/2017).   Family History: family history includes Liver disease in his sister; Lung disease in his father; Lymphoma in his sister; No Known Problems in his mother.   Social History:  reports that he has quit smoking. He has never used smokeless tobacco. He reports that he does not drink alcohol and does not use drugs.   Current Medications: has a current medication list which includes the following prescription(s): albuterol, allopurinol, aspirin, doxycycline, duloxetine, fluticasone propion-salmeterol, furosemide, losartan, metformin, pantoprazole, potassium chloride, rosuvastatin, verapamil, and torsemide.   Allergies:  No Known Allergies   ROS:  A 15 point review of systems was performed and pertinent positives and negatives noted in HPI   Objective:    BP (!) 191/92   Pulse 73   Ht 180.3 cm ('5\' 11"'$ )   Wt (!) 188.9 kg (416 lb 7.2 oz)   BMI 58.08 kg/m    Constitutional :  No distress, cooperative, alert  Lymphatics/Throat:  Supple with no lymphadenopathy  Respiratory:  Clear to auscultation bilaterally  Cardiovascular:  Regular rate and  rhythm  Gastrointestinal: Soft, non-tender, non-distended, no organomegaly.  Musculoskeletal: Steady gait and movement  Skin: Cool and moist, infected sebaceous cyst with caseous discharge on exam, left neck.  Has asymptomatic epidermal cyst on right side  Psychiatric: Normal affect, non-agitated, not confused           LABS:  N/a    RADS: N/a   Assessment:       Epidermal cyst of neck [L72.0] Resolving on left, asymptomatic non-infected one on right.  Pt requesting removal of both at same time. Plan:    1. Epidermal cyst of neck [L72.0] Discussed surgical excision.  Alternatives include continued observation.  Benefits include possible symptom relief, pathologic evaluation, improved cosmesis. Discussed the risk of surgery including recurrence, chronic pain, post-op infxn, poor cosmesis, poor/delayed wound healing, and possible re-operation to address said risks. The risks of general anesthetic, if used, includes MI, CVA, sudden death or even reaction to anesthetic medications also discussed.  Typical post-op recovery time of 3-5 days with possible activity restrictions were also discussed.   The patient verbalized understanding and all questions were answered to the patient's satisfaction.   2. Patient has elected to proceed with surgical treatment. Procedure will be scheduled, in OR due to location, number, and repeat procedure. Supine, head turned away as needed   labs/images/medications/previous chart entries reviewed personally and relevant changes/updates noted above.

## 2022-06-22 ENCOUNTER — Ambulatory Visit: Payer: 59 | Admitting: Family

## 2022-07-07 ENCOUNTER — Encounter
Admission: RE | Admit: 2022-07-07 | Discharge: 2022-07-07 | Disposition: A | Discharge status: Home / Self Care | Payer: 59 | Source: Ambulatory Visit | Attending: Surgery | Admitting: Surgery

## 2022-07-07 HISTORY — DX: Edema, unspecified: R60.9

## 2022-07-07 HISTORY — DX: Localized edema: R60.0

## 2022-07-07 HISTORY — DX: Hyperlipidemia, unspecified: E78.5

## 2022-07-07 NOTE — Patient Instructions (Addendum)
Your procedure is scheduled on: Friday, December 8 Report to the Registration Desk on the 1st floor of the [facility]. To find out your arrival time, please call [phone] between 1PM - 3PM on: Thursday, December 7 If your arrival time is 6:00 am, do not arrive prior to that time as the [facility] entrance doors do not open until 6:00 am.  REMEMBER: Instructions that are not followed completely may result in serious medical risk, up to and including death; or upon the discretion of your surgeon and anesthesiologist your surgery may need to be rescheduled.  Do not eat food after midnight the night before surgery.  No gum chewing, lozengers or hard candies.  You may however, drink water up to 2 hours before you are scheduled to arrive for your surgery. Do not drink anything within 2 hours of your scheduled arrival time.  TAKE THESE MEDICATIONS THE MORNING OF SURGERY WITH A SIP OF WATER:  Duloxetine (Cymbalta) Pantoprazole (Protonix) - (take one the night before and one on the morning of surgery - helps to prevent nausea after surgery.) Advair inhaler  Use inhalers on the day of surgery and bring your albuterol inhaler to the hospital.  Metformin - hold for 2 days prior to surgery. Last day to take is Tuesday, December 5. Resume AFTER surgery.  One week prior to surgery: starting December 1 Stop aspirin and Anti-inflammatories (NSAIDS) such as Advil, Aleve, Ibuprofen, Motrin, Naproxen, Naprosyn and Aspirin based products such as Excedrin, Goodys Powder, BC Powder. Stop ANY OVER THE COUNTER supplements until after surgery. You may however, continue to take Tylenol if needed for pain up until the day of surgery.  No Alcohol for 24 hours before or after surgery.  No Smoking including e-cigarettes for 24 hours prior to surgery.  No chewable tobacco products for at least 6 hours prior to surgery.  No nicotine patches on the day of surgery.  Do not use any "recreational" drugs for  at least a week prior to your surgery.  Please be advised that the combination of cocaine and anesthesia may have negative outcomes, up to and including death. If you test positive for cocaine, your surgery will be cancelled.  On the morning of surgery brush your teeth with toothpaste and water, you may rinse your mouth with mouthwash if you wish. Do not swallow any toothpaste or mouthwash.  Use CHG Soap as directed on instruction sheet.  Do not wear jewelry, make-up, hairpins, clips or nail polish.  Do not wear lotions, powders, or perfumes.   Do not shave body from the neck down 48 hours prior to surgery just in case you cut yourself which could leave a site for infection.  Also, freshly shaved skin may become irritated if using the CHG soap.  Contact lenses, hearing aids and dentures may not be worn into surgery.  Do not bring valuables to the hospital. [facility] is not responsible for any missing/lost belongings or valuables.   Bring your C-PAP to the hospital with you in case you may have to spend the night.   Notify your doctor if there is any change in your medical condition (cold, fever, infection).  Wear comfortable clothing (specific to your surgery type) to the hospital.  After surgery, you can help prevent lung complications by doing breathing exercises.  Take deep breaths and cough every 1-2 hours. Your doctor may order a device called an Incentive Spirometer to help you take deep breaths.  If you are being discharged  the day of surgery, you will not be allowed to drive home. You will need a responsible adult (18 years or older) to drive you home and stay with you that night.   If you are taking public transportation, you will need to have a responsible adult (18 years or older) with you. Please confirm with your physician that it is acceptable to use public transportation.   Please call the [facility] at [phone] if you have any questions  about these instructions.  Surgery Visitation Policy:  Patients undergoing a surgery or procedure may have two family members or support persons with them as long as the person is not COVID-19 positive or experiencing its symptoms.      Preparing for Surgery with CHLORHEXIDINE GLUCONATE (CHG) Soap  Chlorhexidine Gluconate (CHG) Soap  o An antiseptic cleaner that kills germs and bonds with the skin to continue killing germs even after washing  o Used for showering the night before surgery and morning of surgery  Before surgery, you can play an important role by reducing the number of germs on your skin.  CHG (Chlorhexidine gluconate) soap is an antiseptic cleanser which kills germs and bonds with the skin to continue killing germs even after washing.  Please do not use if you have an allergy to CHG or antibacterial soaps. If your skin becomes reddened/irritated stop using the CHG.  1. Shower the NIGHT BEFORE SURGERY and the MORNING OF SURGERY with CHG soap.  2. If you choose to wash your hair, wash your hair first as usual with your normal shampoo.  3. After shampooing, rinse your hair and body thoroughly to remove the shampoo.  4. Use CHG as you would any other liquid soap. You can apply CHG directly to the skin and wash gently with a scrungie or a clean washcloth.  5. Apply the CHG soap to your body only from the neck down. Do not use on open wounds or open sores. Avoid contact with your eyes, ears, mouth, and genitals (private parts). Wash face and genitals (private parts) with your normal soap.  6. Wash thoroughly, paying special attention to the area where your surgery will be performed.  7. Thoroughly rinse your body with warm water.  8. Do not shower/wash with your normal soap after using and rinsing off the CHG soap.  9. Pat yourself dry with a clean towel.  10. Wear clean pajamas to bed the night before surgery.  12. Place clean sheets on your bed the night of your  first shower and do not sleep with pets.  13. Shower again with the CHG soap on the day of surgery prior to arriving at the hospital.  14. Do not apply any deodorants/lotions/powders.  15. Please wear clean clothes to the hospital.

## 2022-07-08 ENCOUNTER — Ambulatory Visit: Payer: 59 | Attending: Cardiology | Admitting: Cardiology

## 2022-07-08 ENCOUNTER — Encounter: Payer: Self-pay | Admitting: Surgery

## 2022-07-08 ENCOUNTER — Telehealth: Payer: Self-pay

## 2022-07-08 ENCOUNTER — Encounter: Payer: Self-pay | Admitting: Cardiology

## 2022-07-08 ENCOUNTER — Telehealth: Payer: Self-pay | Admitting: *Deleted

## 2022-07-08 ENCOUNTER — Encounter
Admission: RE | Admit: 2022-07-08 | Discharge: 2022-07-08 | Disposition: A | Discharge status: Home / Self Care | Payer: 59 | Source: Ambulatory Visit | Attending: Surgery | Admitting: Surgery

## 2022-07-08 ENCOUNTER — Encounter: Payer: Self-pay | Admitting: Urgent Care

## 2022-07-08 VITALS — BP 138/72 | HR 83 | Ht 71.0 in | Wt >= 6400 oz

## 2022-07-08 DIAGNOSIS — I10 Essential (primary) hypertension: Secondary | ICD-10-CM

## 2022-07-08 DIAGNOSIS — E119 Type 2 diabetes mellitus without complications: Secondary | ICD-10-CM | POA: Insufficient documentation

## 2022-07-08 DIAGNOSIS — Z01818 Encounter for other preprocedural examination: Secondary | ICD-10-CM | POA: Diagnosis present

## 2022-07-08 DIAGNOSIS — Z01812 Encounter for preprocedural laboratory examination: Secondary | ICD-10-CM

## 2022-07-08 DIAGNOSIS — I2510 Atherosclerotic heart disease of native coronary artery without angina pectoris: Secondary | ICD-10-CM

## 2022-07-08 DIAGNOSIS — I471 Supraventricular tachycardia, unspecified: Secondary | ICD-10-CM | POA: Diagnosis not present

## 2022-07-08 DIAGNOSIS — E782 Mixed hyperlipidemia: Secondary | ICD-10-CM

## 2022-07-08 DIAGNOSIS — R0602 Shortness of breath: Secondary | ICD-10-CM | POA: Diagnosis not present

## 2022-07-08 LAB — CBC
HCT: 43 % (ref 39.0–52.0)
Hemoglobin: 14 g/dL (ref 13.0–17.0)
MCH: 26.4 pg (ref 26.0–34.0)
MCHC: 32.6 g/dL (ref 30.0–36.0)
MCV: 81.1 fL (ref 80.0–100.0)
Platelets: 254 10*3/uL (ref 150–400)
RBC: 5.3 MIL/uL (ref 4.22–5.81)
RDW: 13.7 % (ref 11.5–15.5)
WBC: 7.5 10*3/uL (ref 4.0–10.5)
nRBC: 0 % (ref 0.0–0.2)

## 2022-07-08 LAB — BASIC METABOLIC PANEL
Anion gap: 8 (ref 5–15)
BUN: 10 mg/dL (ref 6–20)
CO2: 27 mmol/L (ref 22–32)
Calcium: 9.1 mg/dL (ref 8.9–10.3)
Chloride: 105 mmol/L (ref 98–111)
Creatinine, Ser: 0.73 mg/dL (ref 0.61–1.24)
GFR, Estimated: >60 mL/min (ref >60)
Glucose, Bld: 168 mg/dL — ABNORMAL HIGH (ref 70–99)
Potassium: 3.8 mmol/L (ref 3.5–5.1)
Sodium: 140 mmol/L (ref 135–145)

## 2022-07-08 NOTE — Telephone Encounter (Signed)
-----   Message from Karen Kitchens, NP sent at 07/08/2022  8:25 AM EST ----- Regarding: Request for pre-operative cardiac clearance Request for pre-operative cardiac clearance:  1. What type of surgery is being performed?  EPIDERMOID CYST REMOVAL  2. When is this surgery scheduled?  07/15/2022  3. Type of clearance being requested (medical, pharmacy, both)? MEDICAL   4. Are there any medications that need to be held prior to surgery? ASA  5. Practice name and name of physician performing surgery?  Performing surgeon: Dr. Benjamine Sprague, DO Requesting clearance: Honor Loh, FNP-C    6. Anesthesia type (none, local, MAC, general)? GENERAL  7. What is the office phone and fax number?   Phone: [phone] Fax: [phone]  ATTENTION: Unable to create telephone message as per your standard workflow. Directed by [facility] providers to send requests for cardiac clearance to this pool for appropriate distribution to provider covering pre-operative clearances.   Honor Loh, MSN, APRN, FNP-C, CEN [facility]  Peri-operative Services Nurse Practitioner Phone: [phone] 07/08/22 8:25 AM

## 2022-07-08 NOTE — Progress Notes (Signed)
Perioperative Services  Pre-Admission/Anesthesia Testing Clinical Review  Date: 07/08/22  Patient Demographics:  Name: Michael Boyle DOB:   [birth year] MRN:   [id number]  Planned Surgical Procedure(s):    Case: [id number] Date/Time: 07/15/22 1115   Procedure: CYST REMOVAL (Neck)   Anesthesia type: General   Pre-op diagnosis: Epidermal cyst of neck L72.0 x 2   Location: [facility] OR ROOM 02 / [facility]   Surgeons: Benjamine Sprague, DO   NOTE: Available PAT nursing documentation and vital signs have been reviewed. Clinical nursing staff has updated patient's PMH/PSHx, current medication list, and drug allergies/intolerances to ensure comprehensive history available to assist in medical decision making as it pertains to the aforementioned surgical procedure and anticipated anesthetic course. Extensive review of available clinical information performed. [facility] and PSHx updated with any diagnoses/procedures that  may have been inadvertently omitted during his intake with the pre-admission testing department's nursing staff.  Clinical Discussion:  Michael Boyle is a 56 y.o. male who is submitted for pre-surgical anesthesia review and clearance prior to him undergoing the above procedure. Patient is a Former Smoker (52.5 pack years; quit 06/2012). Pertinent PMH includes: CAD, PSVT, ILBBB, aortic atherosclerosis, palpitations, HTN, HLD, T2DM, peripheral edema, venous insufficiency, COPD, OSAH (requires nocturnal PAP therapy), GERD (on daily PPI), chronic back pain (thoracic spondylosis; thoracolumbar DDD).  Patient is followed by cardiology Fletcher Anon, MD). He was last seen in the cardiology clinic on 07/08/2022; notes reviewed.  At the time of his clinic visit, patient reported to be doing "fairly well" overall from a cardiovascular perspective.  He continued to report exertional dyspnea that had worsened over the course of the last year.  He denied any episodes of associated  chest pain, PND, orthopnea, palpitations, vertiginous symptoms, or presyncope/syncope.  Patient with BILATERAL lower extremity edema which is improved with antiembolism stockings and extremity elevation.  Patient with a past medical history significant for cardiovascular diagnoses.  CT imaging of the chest performed on 10/05/2021 revealed coronary artery calcifications in addition aortic atherosclerosis.  Most recent TTE was performed on 07/12/2022 that revealed normal left ventricular systolic function with an EF of 60-65%.  There were no regional wall motion abnormalities.  There was mild LVH.  Right ventricular size and function normal.  Left atrium was moderately enlarged.  There was mild mitral valve regurgitation.  All transvalvular gradients were noted to be normal suggesting no evidence of valvular stenosis.  Blood pressure reasonably controlled at 138/72 mmHg on currently prescribed diuretic (furosemide), ARB (losartan) and CCB (verapamil) therapies.  Patient is on rosuvastatin for his HLD diagnosis and ASCVD prevention.  T2DM loosely controlled on currently prescribed regimen; last HgbA1c was 7.5% when checked on 03/22/2022.  Patient does have an OSAH diagnosis and is reported to be compliant with prescribed nocturnal PAP therapy.  Patient reported to be active and he is worklife as a Chief Strategy Officer.  Patient does not have a formal exercise regimen.  With that being said, patient felt to be able to achieve at least 4 METS of physical activity without experiencing any degree of significant angina/anginal equivalent symptoms.  No changes were made to his medication regimen.  In efforts to further evaluate his cardiovascular status prior to upcoming surgery, repeat TTE was ordered.  No changes were made to his medication regimen.  Patient to follow-up with outpatient cardiology in 12 months or sooner.  Michael Boyle is scheduled for an elective EPIDERMOID CYST REMOVAL (NECK) on 07/15/2022 with Dr.  Benjamine Sprague,  DO. Given patient's past medical history significant for cardiovascular diagnoses, elevated BMI, and multiple medical comorbidities, presurgical clearances were sought from patient's primary attending cardiologist and primary care provider.  Specialty clearances were obtained as follows.   Per internal medicine (Arnett, FNP-C), "***  Per cardiology (***), "***  In review of his medication reconciliation, it is noted the patient is on daily antiplatelet therapy.  He has been instructed on recommendations from cardiology for holding his daily low-dose ASA for**days prior to his procedure with plans to restart since postoperatively risk felt to be minimized by his primary attending surgeon.  Patient is aware that his last dose of ASA should be on***.  Patient denies previous perioperative complications with anesthesia in the past. In review of the available records, it is noted that patient underwent a general anesthetic course here at [facility] (ASA III) in 10/2021 without documented complications.      07/08/2022    8:44 AM 07/08/2022    8:39 AM 07/07/2022    5:09 PM  Vitals with BMI  Height _0   _1   Weight 415 lbs 414 lbs 14 oz 416 lbs  BMI 57.91 57.89 58.05    Providers/Specialists:   NOTE: Primary physician provider listed below. Patient may have been seen by APP or partner within same practice.   PROVIDER ROLE / SPECIALTY LAST Shirline Frees, DO General Surgery (Surgeon) 06/20/2022  Burnard Hawthorne, FNP Primary Care Provider 03/22/2022  Kathlyn Sacramento, MD Cardiology 07/08/2022   Allergies:  Patient has no known allergies.  Current Home Medications:   No current facility-administered medications for this encounter.    albuterol (VENTOLIN HFA) 108 (90 Base) MCG/ACT inhaler   allopurinol (ZYLOPRIM) 100 MG tablet   aspirin EC 81 MG tablet   DULoxetine (CYMBALTA) 60 MG capsule   fluticasone-salmeterol (ADVAIR DISKUS)  500-50 MCG/ACT AEPB   furosemide (LASIX) 20 MG tablet   glucose blood test strip   KLOR-CON M20 20 MEQ tablet   losartan (COZAAR) 25 MG tablet   metFORMIN (GLUCOPHAGE-XR) 500 MG 24 hr tablet   pantoprazole (PROTONIX) 40 MG tablet   rosuvastatin (CRESTOR) 10 MG tablet   verapamil (CALAN-SR) 180 MG CR tablet   History:   Past Medical History:  Diagnosis Date   Aortic atherosclerosis (HCC)    Chronic back pain    Colon polyps    COPD (chronic obstructive pulmonary disease) (HCC)    Coronary artery calcification seen on CT scan    DDD (degenerative disc disease), thoracolumbar    Genetic testing 12/07/2017   Common Cancers panel (47 genes) @ [facility] - No pathogenic mutations detected   GERD (gastroesophageal reflux disease)    Gout    History of 2019 novel coronavirus disease (COVID-19) 08/16/2021   a.) symptomatic 08/13/2021; tested (+) 08/16/2021 per [place] acute care notes   HTN (hypertension)    Hyperlipidemia    Incomplete left bundle branch block (LBBB)    a.) TTE 11/25/2015: EF 60-65%, mild LVH, mild MAC; b.) TTE 10/17/2017: EF 55-65, mod LVH, mild BAE; c.) TTE 01/20/2020: EF 55-60%, no RWMAs   Morbid obesity (HCC)    OSA on CPAP    Palpitations    a.) felt to be PSVT   Paroxysmal SVT (supraventricular tachycardia)    Peripheral edema    T2DM (type 2 diabetes mellitus) ([place])    Thoracic spondylosis    Venous insufficiency    Past Surgical History:  Procedure Laterality Date  COLONOSCOPY WITH PROPOFOL N/A 08/14/2015   Procedure: COLONOSCOPY WITH PROPOFOL;  Surgeon: Lollie Sails, MD;  Location[facility] ENDOSCOPY;  Service: Endoscopy;  Laterality: N/A;   COLONOSCOPY WITH PROPOFOL N/A 11/02/2017   Procedure: COLONOSCOPY WITH PROPOFOL;  Surgeon: Lollie Sails, MD;  Location: [facility] ENDOSCOPY;  Service: Endoscopy;  Laterality: N/A;   COLONOSCOPY WITH PROPOFOL N/A 02/28/2020   Procedure: COLONOSCOPY WITH PROPOFOL;  Surgeon: Jonathon Bellows, MD;  Location: [facility] ENDOSCOPY;  Service:  Gastroenterology;  Laterality: N/A;   COLONOSCOPY WITH PROPOFOL N/A 10/14/2021   Procedure: COLONOSCOPY WITH PROPOFOL;  Surgeon: Jonathon Bellows, MD;  Location: [facility] ENDOSCOPY;  Service: Gastroenterology;  Laterality: N/A;   ESOPHAGOGASTRODUODENOSCOPY (EGD) WITH PROPOFOL N/A 08/14/2015   Procedure: ESOPHAGOGASTRODUODENOSCOPY (EGD) WITH PROPOFOL;  Surgeon: Lollie Sails, MD;  Location: [facility] ENDOSCOPY;  Service: Endoscopy;  Laterality: N/A;   WISDOM TOOTH EXTRACTION     Family History  Problem Relation Age of Onset   Cancer Sister        lymphoma dx 28s; currently 28   Diabetes Sister    Cirrhosis Sister    Colon cancer Paternal Uncle        dx 80s; deceased 70   Prostate cancer Paternal Uncle        dx 63s; currently 34   Dementia Maternal Aunt 85   Thyroid cancer Neg Hx    Social History   Tobacco Use   Smoking status: Former    Packs/day: 1.75    Years: 30.00    Total pack years: 52.50    Types: Cigarettes    Quit date: 06/13/2012    Years since quitting: 10.0   Smokeless tobacco: Never  Vaping Use   Vaping Use: Former  Substance Use Topics   Alcohol use: No    Alcohol/week: 0.0 standard drinks of alcohol   Drug use: No    Pertinent Clinical Results:  LABS: Labs reviewed: Acceptable for surgery.  Hospital Outpatient Visit on 07/08/2022  Component Date Value Ref Range Status   Sodium 07/08/2022 140  135 - 145 mmol/L Final   Potassium 07/08/2022 3.8  3.5 - 5.1 mmol/L Final   Chloride 07/08/2022 105  98 - 111 mmol/L Final   CO2 07/08/2022 27  22 - 32 mmol/L Final   Glucose, Bld 07/08/2022 168 (H)  70 - 99 mg/dL Final   Glucose reference range applies only to samples taken after fasting for at least 8 hours.   BUN 07/08/2022 10  6 - 20 mg/dL Final   Creatinine, Ser 07/08/2022 0.73  0.61 - 1.24 mg/dL Final   Calcium 07/08/2022 9.1  8.9 - 10.3 mg/dL Final   GFR, Estimated 07/08/2022 >60  >60 mL/min Final   Comment: (NOTE) Calculated using the CKD-EPI Creatinine Equation  (2021)    Anion gap 07/08/2022 8  5 - 15 Final   Performed at [facility], [place], [place] [address]   WBC 07/08/2022 7.5  4.0 - 10.5 K/uL Final   RBC 07/08/2022 5.30  4.22 - 5.81 MIL/uL Final   Hemoglobin 07/08/2022 14.0  13.0 - 17.0 g/dL Final   HCT 07/08/2022 43.0  39.0 - 52.0 % Final   MCV 07/08/2022 81.1  80.0 - 100.0 fL Final   MCH 07/08/2022 26.4  26.0 - 34.0 pg Final   MCHC 07/08/2022 32.6  30.0 - 36.0 g/dL Final   RDW 07/08/2022 13.7  11.5 - 15.5 % Final   Platelets 07/08/2022 254  150 - 400 K/uL Final  nRBC 07/08/2022 0.0  0.0 - 0.2 % Final   Performed at [facility], [address] [place], [place] [address]    ECG: Date: 07/08/2022 Time ECG obtained: 0805 AM Rate: 58 bpm Rhythm: sinus bradycardia Axis (leads I and aVF): Normal Intervals: PR 156 ms. QRS 102 ms. QTc 396 ms. ST segment and T wave changes: No evidence of acute ST segment elevation or depression Comparison: Tracing obtained on 07/28/2021 showed atrial fibrillation at a rate of 97.  This was in the setting of acute sepsis.  Artifact noted on tracing.  Question interpretation as precordial P waves observed.   IMAGING / PROCEDURES: TRANSTHORACIC ECHOCARDIOGRAM performed on 07/12/2022 Normal left ventricular systolic function with an EF of 60-65% Mild LVH Normal left ventricular diastolic Doppler parameters Moderate LA dilation Normal right ventricular systolic function Mild MR Normal transvalvular gradients; no valvular stenosis No pericardial effusion  DG THORACIC SPINE W/SWIMMERS performed on 01/26/2022 No fractures or spondylolisthesis identified. Mild intervertebral disc space narrowing throughout the thoracic spine with anterior endplate osteophytes. No focal bone lesion is visualized  DG LUMBAR SPINE COMPLETE performed on 01/26/2022 Limited valuation due to body habitus. Exaggerated lordosis of the lumbar spine. Lumbar vertebral body heights appear  preserved without evidence of fracture. Grade 1 anterolisthesis of L4 on L5 Intervertebral disc spaces are preserved Mild facet arthropathy in the lower lumbar spine  CT CHEST LUNG CA SCREEN LOW DOSE W/O CM performed on 10/05/2021 Lung-RADS 1, negative. Continue annual screening with low-dose chest CT without contrast in 12 months. Coronary artery calcifications. Aortic atherosclerosis  Emphysema  PULMONARY FUNCTION TESTING performed on 01/20/2020   Impression and Plan:  Michael Boyle has been referred for pre-anesthesia review and clearance prior to him undergoing the planned anesthetic and procedural courses. Available labs, pertinent testing, and imaging results were personally reviewed by me. This patient has been appropriately cleared by cardiology (*** and by internal/family medicine (***) with the individually indicated risk of significant perioperative complications.  ATTENTION --> PENDING CLEARANCE AT THIS TIME -- NOTE/CONTENTS NOT FINAL UNTIL SIGNED  Based on clinical review performed today (07/08/22), barring any significant acute changes in the patient's overall condition, it is anticipated that he will be able to proceed with the planned surgical intervention. Any acute changes in clinical condition may necessitate his procedure being postponed and/or cancelled. Patient will meet with anesthesia team (MD and/or CRNA) on the day of his procedure for preoperative evaluation/assessment. Questions regarding anesthetic course will be fielded at that time.   Pre-surgical instructions were reviewed with the patient during his PAT appointment and questions were fielded by PAT clinical staff. Patient was advised that if any questions or concerns arise prior to his procedure then he should return a call to PAT and/or his surgeon's office to discuss.  Honor Loh, MSN, APRN, FNP-C, CEN [facility]  Peri-operative Services Nurse Practitioner Phone: [phone] Fax:  [phone] 07/08/22 11:34 AM  NOTE: This note has been prepared using Dragon dictation software. Despite my best ability to proofread, there is always the potential that unintentional transcriptional errors may still occur from this process.

## 2022-07-08 NOTE — Telephone Encounter (Signed)
I s/w the pt and he has been scheduled to see Melinda Crutch, NP today for pre op clearance. Pt asked if he will still need to keep the 07/26/22 appt with Dr. Fletcher Anon. I advised the pt to d/w NP today. Pt thanked me for the help. He said hen had his EKG for pre op done today at the hospital.

## 2022-07-08 NOTE — Telephone Encounter (Signed)
    Primary Cardiologist:Muhammad Fletcher Anon, MD  Chart reviewed as part of pre-operative protocol coverage. Because of Michael Boyle's past medical history and time since last visit, he/she will require a follow-up visit in order to better assess preoperative cardiovascular risk.  Pre-op covering staff: - Please schedule appointment and call patient to inform them. - Please contact requesting surgeon's office via preferred method (i.e, phone, fax) to inform them of need for appointment prior to surgery.  If applicable, this message will also be routed to pharmacy pool and/or primary cardiologist for input on holding anticoagulant/antiplatelet agent as requested below so that this information is available at time of patient's appointment.   Deberah Pelton, NP  07/08/2022, 9:50 AM

## 2022-07-08 NOTE — Patient Instructions (Signed)
Medication Instructions:  No changes *If you need a refill on your cardiac medications before your next appointment, please call your pharmacy*   Lab Work: None ordered If you have labs (blood work) drawn today and your tests are completely normal, you will receive your results only by: [facility] (if you have MyChart) OR A paper copy in the mail If you have any lab test that is abnormal or we need to change your treatment, we will call you to review the results.   Testing/Procedures: Your physician has requested that you have an echocardiogram. Echocardiography is a painless test that uses sound waves to create images of your heart. It provides your doctor with information about the size and shape of your heart and how well your heart's chambers and valves are working.   You may receive an ultrasound enhancing agent through an IV if needed to better visualize your heart during the echo. This procedure takes approximately one hour.  There are no restrictions for this procedure.  This will take place at [facility] [id number], [place]    Follow-Up: At [facility], you and your health needs are our priority.  As part of our continuing mission to provide you with exceptional heart care, we have created designated Provider Care Teams.  These Care Teams include your primary Cardiologist (physician) and Advanced Practice Providers (APPs -  Physician Assistants and Nurse Practitioners) who all work together to provide you with the care you need, when you need it.  We recommend signing up for the patient portal called "MyChart".  Sign up information is provided on this After Visit Summary.  MyChart is used to connect with patients for Virtual Visits (Telemedicine).  Patients are able to view lab/test results, encounter notes, upcoming appointments, etc.  Non-urgent messages can be sent to your provider as well.   To learn more about what you can  do with MyChart, go to [email or web address].    Your next appointment:   12 month(s)  The format for your next appointment:   In Person  Provider:   You may see Kathlyn Sacramento, MD or one of the following Advanced Practice Providers on your designated Care Team:   Murray Hodgkins, NP Christell Faith, PA-C Cadence Kathlen Mody, PA-C Gerrie Nordmann, NP

## 2022-07-08 NOTE — Progress Notes (Signed)
Cardiology Clinic Note   Patient Name: Michael Boyle Date of Encounter: 07/08/2022  Primary Care Provider:  Burnard Hawthorne, FNP Primary Cardiologist:  Kathlyn Sacramento, MD  Patient Profile    56 year old male with a history of paroxysmal tachycardia, hypertension, hyperlipidemia, diabetes, chronic venous insufficiency, incomplete left bundle branch block on previous EKG, morbid obesity, COPD from prior tobacco abuse for 30 years at 2 packs a day quitting 06/2012, gastroesophageal reflux disease, and gout who presents today for follow-up and cardiac clearance.  Past Medical History    Past Medical History:  Diagnosis Date   Aortic atherosclerosis (HCC)    Chronic back pain    Colon polyps    COPD (chronic obstructive pulmonary disease) (HCC)    Coronary artery calcification seen on CT scan    DDD (degenerative disc disease), thoracolumbar    Genetic testing 12/07/2017   Common Cancers panel (47 genes) @ [facility] - No pathogenic mutations detected   GERD (gastroesophageal reflux disease)    Gout    History of 2019 novel coronavirus disease (COVID-19) 08/16/2021   a.) symptomatic 08/13/2021; tested (+) 08/16/2021 per [facility] acute care notes   HTN (hypertension)    Hyperlipidemia    Incomplete left bundle branch block (LBBB)    a.) TTE 11/25/2015: EF 60-65%, mild LVH, mild MAC; b.) TTE 10/17/2017: EF 55-65, mod LVH, mild BAE; c.) TTE 01/20/2020: EF 55-60%, no RWMAs   Morbid obesity (HCC)    OSA on CPAP    Palpitations    a.) felt to be PSVT   Paroxysmal SVT (supraventricular tachycardia)    Peripheral edema    T2DM (type 2 diabetes mellitus) ([place])    Thoracic spondylosis    Venous insufficiency    Past Surgical History:  Procedure Laterality Date   COLONOSCOPY WITH PROPOFOL N/A 08/14/2015   Procedure: COLONOSCOPY WITH PROPOFOL;  Surgeon: Lollie Sails, MD;  Location: [facility] ENDOSCOPY;  Service: Endoscopy;  Laterality: N/A;   COLONOSCOPY WITH PROPOFOL N/A 11/02/2017    Procedure: COLONOSCOPY WITH PROPOFOL;  Surgeon: Lollie Sails, MD;  Location: [facility] ENDOSCOPY;  Service: Endoscopy;  Laterality: N/A;   COLONOSCOPY WITH PROPOFOL N/A 02/28/2020   Procedure: COLONOSCOPY WITH PROPOFOL;  Surgeon: Jonathon Bellows, MD;  Location: [facility] ENDOSCOPY;  Service: Gastroenterology;  Laterality: N/A;   COLONOSCOPY WITH PROPOFOL N/A 10/14/2021   Procedure: COLONOSCOPY WITH PROPOFOL;  Surgeon: Jonathon Bellows, MD;  Location: [facility] ENDOSCOPY;  Service: Gastroenterology;  Laterality: N/A;   ESOPHAGOGASTRODUODENOSCOPY (EGD) WITH PROPOFOL N/A 08/14/2015   Procedure: ESOPHAGOGASTRODUODENOSCOPY (EGD) WITH PROPOFOL;  Surgeon: Lollie Sails, MD;  Location: [facility] ENDOSCOPY;  Service: Endoscopy;  Laterality: N/A;   WISDOM TOOTH EXTRACTION      Allergies  No Known Allergies  History of Present Illness    Michael Boyle is a 56 year old male with previously mentioned past medical history of paroxysmal tachycardia, incomplete left bundle branch block, hypretension, hyperlipidemia, diabetes, chronic venous insufficiency, morbid obesity, COPD from prior tobacco abuse, gastroesophageal reflux disease, and gout.  Patient was previously followed by Dr. Ubaldo Glassing, but transitioned to Dr. Mariea Clonts for evaluation of abnormal EKG.  His paroxysmal tachycardia had been felt likely to be PSVT and he is described having to receive adenosine for termination.  He was managed on verapamil while being followed by Dr. Ubaldo Glassing without recurrent symptoms.  Prior EKG in 2017 was done in his PCP office which showed incomplete left bundle branch block.  He denies any chest pain or discomfort at that time.  He noted mild exertional dyspnea and was taken torsemide 20 mg daily for lower extremity edema.  He underwent echocardiogram 11/2015 which showed an EF of 60 to 65%, unable to assess wall motion, normal LV diastolic function parameters, mildly calcified mitral annulus.  Ischemic evaluation was not felt to be indicated given the  patient had no symptoms of chest pain and overall suspicion for ischemic heart disease was low.  He was continued on verapamil.  In 09/2017 for preprocedural cardiac evaluation for colonoscopy.  He was doing well at the time and noted exertional dyspnea walking up stairs or incline.  Given his shortness of breath he underwent echocardiogram in 10/2017 which showed EF 55-65%, mildly dilated LV with moderate concentric LVH, normal diastolic function, mild biatrial enlargement, mildly to moderately dilated RV with normal systolic function.  Patient was last seen in clinic 04/30/2020 by Dr. Fletcher Anon.  At that time he complained of DOE and bilateral leg swelling.  Had several ultrasounds of his bilateral lower extremities to rule out DVT which subsequently were negative.,  No changes made to his medications and no further testing was ordered.  He returns to clinic today requiring surgical clearance.  He states that overall he has been doing fairly well.  He states that his shortness of breath and dyspnea on exertion he thinks has worsened over the last last year.  He does deny any chest discomfort, palpitations, syncope or near syncope.  He does continue to endorse bilateral lower extremity swelling where he continues to wear compression socks during the day while he is working.  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  Denies any recent hospitalizations or visits to the emergency department.  Home Medications    Current Outpatient Medications  Medication Sig Dispense Refill   albuterol (VENTOLIN HFA) 108 (90 Base) MCG/ACT inhaler Inhale 2 puffs into the lungs every 6 (six) hours as needed for wheezing or shortness of breath. 1 each 2   allopurinol (ZYLOPRIM) 100 MG tablet Take 1 tablet (100 mg total) by mouth 2 (two) times daily. 180 tablet 3   aspirin EC 81 MG tablet Take 1 tablet (81 mg total) by mouth daily. 90 tablet 3   DULoxetine (CYMBALTA) 60 MG capsule Take 1 capsule (60 mg total) by mouth daily. 90 capsule  3   fluticasone-salmeterol (ADVAIR DISKUS) 500-50 MCG/ACT AEPB Inhale 1 puff into the lungs in the morning and at bedtime. 3 each 3   furosemide (LASIX) 20 MG tablet Take 1 tablet (20 mg total) by mouth daily as needed. 90 tablet 1   glucose blood test strip Use as instructed 50 each 12   KLOR-CON M20 20 MEQ tablet TAKE 2 TABLETS DAILY (Patient taking differently: Take 20 mEq by mouth daily. TAKES A SECOND TABLET IF TAKES LASIX) 180 tablet 0   losartan (COZAAR) 25 MG tablet Take 1 tablet (25 mg total) by mouth daily. 90 tablet 3   metFORMIN (GLUCOPHAGE-XR) 500 MG 24 hr tablet Take 4 tablets (2,000 mg total) by mouth every evening. 360 tablet 3   pantoprazole (PROTONIX) 40 MG tablet Take 1 tablet (40 mg total) by mouth daily. 90 tablet 3   rosuvastatin (CRESTOR) 10 MG tablet Take 1 tablet (10 mg total) by mouth daily. (Patient taking differently: Take 10 mg by mouth at bedtime.) 90 tablet 3   verapamil (CALAN-SR) 180 MG CR tablet Take 1 tablet (180 mg total) by mouth 2 (two) times daily. (Patient taking differently: Take 360 mg by mouth at bedtime.)  180 tablet 3   No current facility-administered medications for this visit.     Family History    Family History  Problem Relation Age of Onset   Cancer Sister        lymphoma dx 39s; currently 22   Diabetes Sister    Cirrhosis Sister    Colon cancer Paternal Uncle        dx 4s; deceased 35   Prostate cancer Paternal Uncle        dx 18s; currently 28   Dementia Maternal Aunt 85   Thyroid cancer Neg Hx    He indicated that his mother is alive. He indicated that his father is deceased. He indicated that his sister is alive. He indicated that his maternal aunt is alive. He indicated that only one of his two paternal uncles is alive. He indicated that the status of his neg hx is unknown.  Social History    Social History   Socioeconomic History   Marital status: Married    Spouse name: Trudy   Number of children: 0   Years of education:  Not on file   Highest education level: Not on file  Occupational History   Not on file  Tobacco Use   Smoking status: Former    Packs/day: 1.75    Years: 30.00    Total pack years: 52.50    Types: Cigarettes    Quit date: 06/13/2012    Years since quitting: 10.0   Smokeless tobacco: Never  Vaping Use   Vaping Use: Former  Substance and Sexual Activity   Alcohol use: No    Alcohol/week: 0.0 standard drinks of alcohol   Drug use: No   Sexual activity: Not on file  Other Topics Concern   Not on file  Social History Narrative   Not on file   Social Determinants of Health   Financial Resource Strain: Not on file  Food Insecurity: Not on file  Transportation Needs: Not on file  Physical Activity: Not on file  Stress: Not on file  Social Connections: Not on file  Intimate Partner Violence: Not on file     Review of Systems    General:  No chills, fever, night sweats or weight changes.  Cardiovascular:  No chest pain, endorses worsening dyspnea on exertion, endorses edema, orthopnea, palpitations, paroxysmal nocturnal dyspnea. Dermatological: No rash, lesions/masses Respiratory: No cough, endorses worsening dyspnea Urologic: No hematuria, dysuria Abdominal:   No nausea, vomiting, diarrhea, bright red blood per rectum, melena, or hematemesis Neurologic:  No visual changes, wkns, changes in mental status. All other systems reviewed and are otherwise negative except as noted above.   Physical Exam    VS:  BP 138/72   Pulse 83   Ht _0  (1.803 m)   Wt (!) 414 lb (187.8 kg)   SpO2 94%   BMI 57.74 kg/m  , BMI Body mass index is 57.74 kg/m.     GEN: Well nourished, well developed, in no acute distress. HEENT: normal.  Glasses on. Neck: Supple, no JVD, carotid bruits, or masses. Cardiac: RRR, no murmurs, rubs, or gallops.  Distant heart no clubbing, cyanosis, trace pretibial edema.  Radials 2+/PT 2+ and equal bilaterally.  Chronic venous insufficiency with compression  socks Respiratory:  Respirations regular and unlabored, clear to auscultation bilaterally. GI: Soft, nontender, nondistended, obese, BS + x 4. MS: no deformity or atrophy. Skin: warm and dry, no rash. Neuro:  Strength and sensation are intact. Psych: Normal affect.  Accessory Clinical Findings    ECG personally reviewed by me today-sinus rhythm with a rate of 67 with a rightward axis deviation- No acute changes  Lab Results  Component Value Date   WBC 7.5 07/08/2022   HGB 14.0 07/08/2022   HCT 43.0 07/08/2022   MCV 81.1 07/08/2022   PLT 254 07/08/2022   Lab Results  Component Value Date   CREATININE 0.73 07/08/2022   BUN 10 07/08/2022   NA 140 07/08/2022   K 3.8 07/08/2022   CL 105 07/08/2022   CO2 27 07/08/2022   Lab Results  Component Value Date   ALT 30 03/22/2022   AST 29 03/22/2022   ALKPHOS 50 03/22/2022   BILITOT 0.5 03/22/2022   Lab Results  Component Value Date   CHOL 121 03/22/2022   HDL 45.40 03/22/2022   LDLCALC 52 03/22/2022   TRIG 116.0 03/22/2022   CHOLHDL 3 03/22/2022    Lab Results  Component Value Date   HGBA1C 7.5 (A) 03/22/2022    Assessment & Plan   1.  Chronic shortness of breath dyspnea on exertion patient states he feels this slightly worsened over the past year.  He states that he does become extremely short winded walking up stairs or inclines or even on flat surfaces if he is carrying anything.  Last echocardiogram done 01/20/2020 revealed LVEF of 55-60%, no regional wall motion abnormality, and no valvular abnormalities were noted.  With upcoming surgery requiring anesthesia he is having repeat study done to evaluate his worsening shortness of breath.  2.  Coronary atherosclerosis that was noted on CT scan of the lungs.  And he denies any chest pain or chest pressure.  Unfortunately due to his body habitus a Lexiscan Myoview will have limitations assessing for CTA of the coronary arteries.  The only definitive test would be a heart  catheterization.  With the lack of chest discomfort and no ischemic changes noted on his EKG, no further ischemic work-up at this time.  He is continued on aspirin 81 mg daily and rosuvastatin 10 mg daily.  3.  Paroxysmal supraventricular tachycardia with no recurrent episodes.  Sinus rhythm with a rate of 67 on EKG.  He is continued on verapamil 180 Milligrams twice daily.  4.  Hypertension blood pressure today 138/72.  He is continued on losartan 25 mg daily, verapamil 180 mg twice daily and furosemide 20 mg daily as needed  5.  Hyperlipidemia with LDL of 52 03/22/2022.  He is continued on rosuvastatin 10 mg daily  6.  Disposition patient return to clinic to see MD/APP in 11 to 12 months and EKG on return on sooner if needed.  Patient was advised at that echocardiogram will be completed and he will be offered cardiac clearance if stable EF and no regional wall motion abnormalities were noted.  If study is abnormal then follow-up appointment will be moved up to discuss further options.  Hennessey Cantrell, NP 07/08/2022, 4:45 PM

## 2022-07-08 NOTE — Telephone Encounter (Signed)
Request for pre-operative cardiac clearance Received: Today Michael Kitchens, NP  P Cv Div Preop Callback Request for pre-operative cardiac clearance:   1. What type of surgery is being performed? EPIDERMOID CYST REMOVAL  2. When is this surgery scheduled? 07/15/2022   3. Type of clearance being requested (medical, pharmacy, both)? MEDICAL   4. Are there any medications that need to be held prior to surgery? ASA  5. Practice name and name of physician performing surgery? Performing surgeon: Dr. Benjamine Sprague, DO Requesting clearance: Michael Loh, FNP-C     6. Anesthesia type (none, local, MAC, general)? GENERAL  7. What is the office phone and fax number?   Phone: [phone] Fax: [phone]  ATTENTION: Unable to create telephone message as per your standard workflow. Directed by [facility] providers to send requests for cardiac clearance to this pool for appropriate distribution to provider covering pre-operative clearances.  Michael Loh, MSN, APRN, FNP-C, CEN [facility] Peri-operative Services Nurse Practitioner Phone: [phone] 07/08/22 8:25 AM

## 2022-07-12 ENCOUNTER — Encounter: Payer: Self-pay | Admitting: Surgery

## 2022-07-12 ENCOUNTER — Ambulatory Visit: Payer: 59 | Attending: Cardiology

## 2022-07-12 ENCOUNTER — Ambulatory Visit: Payer: 59 | Admitting: Family

## 2022-07-12 DIAGNOSIS — R0602 Shortness of breath: Secondary | ICD-10-CM

## 2022-07-12 LAB — ECHOCARDIOGRAM COMPLETE
AR max vel: 3.58 cm2
AV Area VTI: 3.81 cm2
AV Area mean vel: 3.8 cm2
AV Mean grad: 4 mmHg
AV Peak grad: 9.5 mmHg
Ao pk vel: 1.54 m/s
Area-P 1/2: 3.65 cm2
S' Lateral: 3.2 cm

## 2022-07-12 MED ORDER — PERFLUTREN LIPID MICROSPHERE
1.0000 mL | INTRAVENOUS | Status: AC | PRN
  Administered 2022-07-12: 2 mL via INTRAVENOUS

## 2022-07-12 NOTE — Telephone Encounter (Signed)
I will update the requesting office to see notes from Melinda Crutch, NP, pt needs echo before he can be cleared.

## 2022-07-13 ENCOUNTER — Ambulatory Visit: Payer: 59 | Admitting: Family

## 2022-07-13 ENCOUNTER — Encounter: Payer: Self-pay | Admitting: Family

## 2022-07-13 VITALS — BP 136/70 | HR 57 | Temp 97.5°F | Wt >= 6400 oz

## 2022-07-13 DIAGNOSIS — E785 Hyperlipidemia, unspecified: Secondary | ICD-10-CM

## 2022-07-13 DIAGNOSIS — R7309 Other abnormal glucose: Secondary | ICD-10-CM

## 2022-07-13 DIAGNOSIS — E1169 Type 2 diabetes mellitus with other specified complication: Secondary | ICD-10-CM

## 2022-07-13 DIAGNOSIS — I10 Essential (primary) hypertension: Secondary | ICD-10-CM

## 2022-07-13 DIAGNOSIS — L0291 Cutaneous abscess, unspecified: Secondary | ICD-10-CM

## 2022-07-13 DIAGNOSIS — J449 Chronic obstructive pulmonary disease, unspecified: Secondary | ICD-10-CM

## 2022-07-13 DIAGNOSIS — G4733 Obstructive sleep apnea (adult) (pediatric): Secondary | ICD-10-CM

## 2022-07-13 DIAGNOSIS — E119 Type 2 diabetes mellitus without complications: Secondary | ICD-10-CM

## 2022-07-13 LAB — PROTIME-INR
INR: 1.1 ratio — ABNORMAL HIGH (ref 0.8–1.0)
Prothrombin Time: 12.1 s (ref 9.6–13.1)

## 2022-07-13 LAB — COMPREHENSIVE METABOLIC PANEL
ALT: 25 U/L (ref 0–53)
AST: 23 U/L (ref 0–37)
Albumin: 4.2 g/dL (ref 3.5–5.2)
Alkaline Phosphatase: 53 U/L (ref 39–117)
BUN: 10 mg/dL (ref 6–23)
CO2: 29 mEq/L (ref 19–32)
Calcium: 9 mg/dL (ref 8.4–10.5)
Chloride: 101 mEq/L (ref 96–112)
Creatinine, Ser: 0.74 mg/dL (ref 0.40–1.50)
GFR: 100.67 mL/min (ref >60.00)
Glucose, Bld: 157 mg/dL — ABNORMAL HIGH (ref 70–99)
Potassium: 4.1 mEq/L (ref 3.5–5.1)
Sodium: 138 mEq/L (ref 135–145)
Total Bilirubin: 0.6 mg/dL (ref 0.2–1.2)
Total Protein: 7.3 g/dL (ref 6.0–8.3)

## 2022-07-13 LAB — CBC WITH DIFFERENTIAL/PLATELET
Basophils Absolute: 0.1 10*3/uL (ref 0.0–0.1)
Basophils Relative: 1.4 % (ref 0.0–3.0)
Eosinophils Absolute: 0.2 10*3/uL (ref 0.0–0.7)
Eosinophils Relative: 2.7 % (ref 0.0–5.0)
HCT: 42.9 % (ref 39.0–52.0)
Hemoglobin: 14.3 g/dL (ref 13.0–17.0)
Lymphocytes Relative: 20.4 % (ref 12.0–46.0)
Lymphs Abs: 1.6 10*3/uL (ref 0.7–4.0)
MCHC: 33.4 g/dL (ref 30.0–36.0)
MCV: 81.5 fl (ref 78.0–100.0)
Monocytes Absolute: 0.7 10*3/uL (ref 0.1–1.0)
Monocytes Relative: 9.3 % (ref 3.0–12.0)
Neutro Abs: 5.1 10*3/uL (ref 1.4–7.7)
Neutrophils Relative %: 66.2 % (ref 43.0–77.0)
Platelets: 244 10*3/uL (ref 150.0–400.0)
RBC: 5.27 Mil/uL (ref 4.22–5.81)
RDW: 14.8 % (ref 11.5–15.5)
WBC: 7.6 10*3/uL (ref 4.0–10.5)

## 2022-07-13 LAB — POCT GLYCOSYLATED HEMOGLOBIN (HGB A1C): Hemoglobin A1C: 7.8 % — AB (ref 4.0–5.6)

## 2022-07-13 MED ORDER — EMPAGLIFLOZIN 10 MG PO TABS: 10.0000 mg | ORAL_TABLET | Freq: Every day | ORAL | 2 refills | Status: DC

## 2022-07-13 NOTE — Patient Instructions (Addendum)
Call [facility]ology as you are over due for follow up.   Start jardiance  We discussed starting a new medication called Jardiance today which protects her kidneys, prevents against cardiovascular disease and lowers your hemoglobin A1c.  It is very important in this medication that you read the below.    You should stop taking Jardiance ( SGLT2 inhibitor) and seek medical attention immediately if you have any symptoms of ketoacidosis, a serious condition in which the body produces high levels of blood acids called ketones.    Symptoms of ketoacidosis include nausea, vomiting, abdominal pain, tiredness, and trouble breathing.    You should also be alert for signs and symptoms of a urinary tract infection, such as a feeling of burning when urinating or the need to urinate often or right away; pain in the lower part of the stomach area or pelvis; fever; or blood in the urine.   Lastly, if you develop vomiting or diarrhea, such as with gastroenteritis, you should also hold Jardiance and call the office immediately.  As any volume depletion on this medication can cause ketoacidosis.  Please hold Jardiance and contact me immediately if you are experiencing any of these symptoms above.  Empagliflozin Oral Tablets What is this medication? EMPAGLIFLOZIN (EM pa gli FLOE zin) helps to treat type 2 diabetes. It helps to control blood sugar. Treatment is combined with diet and exercise. This drug may also reduce the risk of heart attack, stroke, or death if you have type 2 diabetes and risk factors for heart disease. It also treats heart failure. Itmay lower the risk for treatment of heart failure in the hospital. This medicine may be used for other purposes; ask your health care provider orpharmacist if you have questions. COMMON BRAND NAME(S): Jardiance What should I tell my care team before I take this medication? They need to know if you have any of these conditions: dehydration diabetic  ketoacidosis diet low in salt eating less due to illness, surgery, dieting, or any other reason having surgery high cholesterol high levels of potassium in the blood history of pancreatitis or pancreas problems history of yeast infection of the penis or vagina if you often drink alcohol infections in the bladder, kidneys, or urinary tract kidney disease liver disease low blood pressure on hemodialysis problems urinating type 1 diabetes uncircumcised male an unusual or allergic reaction to empagliflozin, other medicines, foods, dyes, or preservatives pregnant or trying to get pregnant breast-feeding How should I use this medication? Take this medicine by mouth with water. Take it as directed on the prescription label at the same time every day. You may take it with or without food. Keeptaking it unless your health care provider tells you to stop. A special MedGuide will be given to you by the pharmacist with eachprescription and refill. Be sure to read this information carefully each time. Talk to your health care provider about the use of this medicine in children.Special care may be needed. Overdosage: If you think you have taken too much of this medicine contact apoison control center or emergency room at once. NOTE: This medicine is only for you. Do not share this medicine with others. What if I miss a dose? If you miss a dose, take it as soon as you can. If it is almost time for yournext dose, take only that dose. Do not take double or extra doses. What may interact with this medication? alcohol diuretics insulin This list may not describe all possible interactions. Give your health  care provider a list of all the medicines, herbs, non-prescription drugs, or dietary supplements you use. Also tell them if you smoke, drink alcohol, or use illegaldrugs. Some items may interact with your medicine. What should I watch for while using this medication? Visit your health care provider  for regular checks on your progress. Tell your health care provider if your symptoms do not start to get better or if they getworse. This medicine can cause a serious condition in which there is too much acid in the blood. If you develop nausea, vomiting, stomach pain, unusual tiredness, or breathing problems, stop taking this medicine and call your doctor right away.If possible, use a ketone dipstick to check for ketones in your urine. Check with your health care provider if you have severe diarrhea, nausea, and vomiting, or if you sweat a lot. The loss of too much body fluid may make itdangerous for you to take this medicine. A test called the HbA1C (A1C) will be monitored. This is a simple blood test. It measures your blood sugar control over the last 2 to 3 months. You willreceive this test every 3 to 6 months. Learn how to check your blood sugar. Learn the symptoms of low and high bloodsugar and how to manage them. Always carry a quick-source of sugar with you in case you have symptoms of low blood sugar. Examples include hard sugar candy or glucose tablets. Make sure others know that you can choke if you eat or drink when you develop serious symptoms of low blood sugar, such as seizures or unconsciousness. Get medicalhelp at once. Tell your health care provider if you have high blood sugar. You might need to change the dose of your medicine. If you are sick or exercising more thanusual, you may need to change the dose of your medicine. What side effects may I notice from receiving this medication? Side effects that you should report to your doctor or health care professionalas soon as possible: allergic reactions (skin rash, itching or hives, swelling of the face, lips, or tongue) breathing problems dizziness feeling faint or lightheaded, falls genital infection (fever; tenderness, redness, or swelling in the genitals or area from the genitals to the back of the rectum) kidney injury (trouble  passing urine or change in the amount of urine) low blood sugar (feeling anxious; confusion; dizziness; increased hunger; unusually weak or tired; increased sweating; shakiness; cold, clammy skin; irritable; headache; blurred vision; fast heartbeat; loss of consciousness) muscle weakness nausea, vomiting, unusual stomach upset or pain new pain or tenderness, change in skin color, sores or ulcers, or infection in legs or feet penile discharge, itching, or pain unusual tiredness unusual vaginal discharge, itching, or odor urinary tract infection (fever; chills; a burning feeling when urinating; urgent need to urinate more often; blood in the urine; back pain) Side effects that usually do not require medical attention (report to yourdoctor or health care professional if they continue or are bothersome): mild increase in urination thirsty This list may not describe all possible side effects. Call your doctor for medical advice about side effects. You may report side effects to FDA at1-800-FDA-1088. Where should I keep my medication? Keep out of the reach of children and pets. Store at room temperature between 20 and 25 degrees C (68 and 77 degrees F).Get rid of any unused medicine after the expiration date. To get rid of medicines that are no longer needed or have expired: Take the medicine to a medicine take-back program. Check with your pharmacy  or law enforcement to find a location. If you cannot return the medicine, check the label or package insert to see if the medicine should be thrown out in the garbage or flushed down the toilet. If you are not sure, ask your health care provider. If it is safe to put it in the trash, take the medicine out of the container. Mix the medicine with cat litter, dirt, coffee grounds, or other unwanted substance. Seal the mixture in a bag or container. Put it in the trash. NOTE: This sheet is a summary. It may not cover all possible information. If you have  questions about this medicine, talk to your doctor, pharmacist, orhealth care provider.  2022 Elsevier/Gold Standard (2020-03-27 19:51:17)

## 2022-07-13 NOTE — Assessment & Plan Note (Signed)
BP Readings from Last 3 Encounters:  07/13/22 136/70  07/08/22 138/72  03/22/22 124/76   Blood pressure stable today.  Continue losartan 25 mg daily, verapamil 180 mg twice daily and furosemide 20 mg daily as needed

## 2022-07-13 NOTE — Progress Notes (Signed)
Subjective:    Patient ID: Michael Boyle, male    DOB: 1966/03/11, 55 y.o.   MRN: [id number]  CC: Michael Boyle is a 56 y.o. male who presents today for follow up.   HPI: Here today for follow-up and also preop surgery.  He has surgery planned for epidermal cyst from 07/15/2022 from left side of his neck.  He has never been under general anesthesia.     Cardiac clearance to be provided by cardiology as seen 07/08/22 by Gerrie Nordmann.   He explains the SOB is not everyday. No sob when walking. He will feel SOB going up stairs which has been that way for '20 years'  Denies chest pain, chest pressure , left arm numbness.   Compliant with Advair.   Left neck epidermal cyst which is smaller in size after antibiotics. He is wearing bandaid now as dime size yellow purulent discharge spontaneously drains.   General surgery recommended to have in surgery setting versus in office. Planning general anesthesia. 06/20/22 Dr Lysle Pearl Treated with doxycycline  168m bid 10 days after evaluated by urgent care 06/14/22 with improvement in regards to size, tenderness.   Denies fever, chills today.   Follow up cardiology 07/08/22 for surgical clearance, worsened SOB and cardiology has ordered echocardiogram.EKG obtained 07/08/22  by ( unable to see one in epic by SGerrie Nordmann NP)   In the absence of chest discomfort, cardiology deferred ischemic work up including heart catheterization. He was continued on asa 833m rosuvastatin 1011md  HTN- compliant with losartan 25 mg daily, verapamil 180 mg twice daily and furosemide 20 mg daily as needed   Cardiology Offered cardiac clearance if stable EF and no regional wall motion abnormalities  Echocardiogram yesterday. Pending results  DM-he is compliant metformin 2000 mg qd. Insurance would not cover trulicity or ozempic.   OSA- compliant with cipap.   Initial consult pulmonology 12/27/19 changed to trelegy at that time.  PFTs shows moderate obstructive  airway disease. Former smoker.   Follow up Tammy Parret 10/2020 for COPD, OSA.   Complaint with advair at this time ; he didn't start trelegy due to insurance cost.   HISTORY:  Past Medical History:  Diagnosis Date   Aortic atherosclerosis ([facility]  Chronic back pain    Colon polyps    COPD (chronic obstructive pulmonary disease) (HCC)    Coronary artery calcification seen on CT scan    DDD (degenerative disc disease), thoracolumbar    Genetic testing 12/07/2017   Common Cancers panel (47 genes) @ [facility] - No pathogenic mutations detected   GERD (gastroesophageal reflux disease)    Gout    History of 2019 novel coronavirus disease (COVID-19) 08/16/2021   a.) symptomatic 08/13/2021; [facility]   HTN (hypertension)    Hyperlipidemia    Incomplete left bundle branch block (LBBB)    a.) TTE 11/25/2015: EF 60-65%, mild LVH, mild MAC; b.) TTE 10/17/2017: EF 55-65, mod LVH, mild BAE; c.) TTE 01/20/2020: EF 55-60%, no RWMAs; d.) TTE 07/12/2022: EF 60-65%, no RWMAs, mild LVH, mod LAE, mild MR.   Morbid obesity (HCC)    OSA on CPAP    Palpitations    a.) felt to be PSVT   Paroxysmal SVT (supraventricular tachycardia)    Peripheral edema    T2DM (type 2 diabetes mellitus) ([facility]  Thoracic spondylosis    Venous insufficiency    Past Surgical History:  Procedure Laterality Date  COLONOSCOPY WITH PROPOFOL N/A 08/14/2015   Procedure: COLONOSCOPY WITH PROPOFOL;  Surgeon: Lollie Sails, MD;  [facility] ENDOSCOPY;  Service: Endoscopy;  Laterality: N/A;   COLONOSCOPY WITH PROPOFOL N/A 11/02/2017   Procedure: COLONOSCOPY WITH PROPOFOL;  Surgeon: Lollie Sails, MD;  Location: [facility] ENDOSCOPY;  Service: Endoscopy;  Laterality: N/A;   COLONOSCOPY WITH PROPOFOL N/A 02/28/2020   Procedure: COLONOSCOPY WITH PROPOFOL;  Surgeon: Jonathon Bellows, MD;  Location: [facility] ENDOSCOPY;  Service: Gastroenterology;  Laterality: N/A;   COLONOSCOPY WITH PROPOFOL N/A 10/14/2021    Procedure: COLONOSCOPY WITH PROPOFOL;  Surgeon: Jonathon Bellows, MD;  Location: [facility] ENDOSCOPY;  Service: Gastroenterology;  Laterality: N/A;   ESOPHAGOGASTRODUODENOSCOPY (EGD) WITH PROPOFOL N/A 08/14/2015   Procedure: ESOPHAGOGASTRODUODENOSCOPY (EGD) WITH PROPOFOL;  Surgeon: Lollie Sails, MD;  Location: [facility] ENDOSCOPY;  Service: Endoscopy;  Laterality: N/A;   WISDOM TOOTH EXTRACTION     Family History  Problem Relation Age of Onset   Cancer Sister        lymphoma dx 73s; currently 58   Diabetes Sister    Cirrhosis Sister    Colon cancer Paternal Uncle        dx 36s; deceased 77   Prostate cancer Paternal Uncle        dx 18s; currently 49   Dementia Maternal Aunt 85   Thyroid cancer Neg Hx     Allergies: Patient has no known allergies. Current Outpatient Medications on File Prior to Visit  Medication Sig Dispense Refill   albuterol (VENTOLIN HFA) 108 (90 Base) MCG/ACT inhaler Inhale 2 puffs into the lungs every 6 (six) hours as needed for wheezing or shortness of breath. 1 each 2   allopurinol (ZYLOPRIM) 100 MG tablet Take 1 tablet (100 mg total) by mouth 2 (two) times daily. 180 tablet 3   aspirin EC 81 MG tablet Take 1 tablet (81 mg total) by mouth daily. 90 tablet 3   DULoxetine (CYMBALTA) 60 MG capsule Take 1 capsule (60 mg total) by mouth daily. 90 capsule 3   fluticasone-salmeterol (ADVAIR DISKUS) 500-50 MCG/ACT AEPB Inhale 1 puff into the lungs in the morning and at bedtime. 3 each 3   furosemide (LASIX) 20 MG tablet Take 1 tablet (20 mg total) by mouth daily as needed. 90 tablet 1   glucose blood test strip Use as instructed 50 each 12   KLOR-CON M20 20 MEQ tablet TAKE 2 TABLETS DAILY (Patient taking differently: Take 20 mEq by mouth daily. TAKES A SECOND TABLET IF TAKES LASIX) 180 tablet 0   losartan (COZAAR) 25 MG tablet Take 1 tablet (25 mg total) by mouth daily. 90 tablet 3   metFORMIN (GLUCOPHAGE-XR) 500 MG 24 hr tablet Take 4 tablets (2,000 mg total) by mouth every  evening. 360 tablet 3   pantoprazole (PROTONIX) 40 MG tablet Take 1 tablet (40 mg total) by mouth daily. 90 tablet 3   rosuvastatin (CRESTOR) 10 MG tablet Take 1 tablet (10 mg total) by mouth daily. (Patient taking differently: Take 10 mg by mouth at bedtime.) 90 tablet 3   verapamil (CALAN-SR) 180 MG CR tablet Take 1 tablet (180 mg total) by mouth 2 (two) times daily. (Patient taking differently: Take 360 mg by mouth at bedtime.) 180 tablet 3   No current facility-administered medications on file prior to visit.    Social History   Tobacco Use   Smoking status: Former    Packs/day: 1.75    Years: 30.00    Total pack years: 52.50  Types: Cigarettes    Quit date: 06/13/2012    Years since quitting: 10.0   Smokeless tobacco: Never  Vaping Use   Vaping Use: Former  Substance Use Topics   Alcohol use: No    Alcohol/week: 0.0 standard drinks of alcohol   Drug use: No    Review of Systems  Constitutional:  Negative for chills and fever.  Respiratory:  Positive for shortness of breath (at baseline). Negative for cough.   Cardiovascular:  Negative for chest pain and palpitations.  Gastrointestinal:  Negative for nausea and vomiting.  Skin:  Negative for wound.      Objective:    BP 136/70   Pulse (!) 57   Temp (!) 97.5 F (36.4 C) (Oral)   Wt (!) 410 lb 6.4 oz (186.2 kg)   SpO2 95%   BMI 57.24 kg/m  BP Readings from Last 3 Encounters:  07/13/22 136/70  07/08/22 138/72  03/22/22 124/76   Wt Readings from Last 3 Encounters:  07/13/22 (!) 410 lb 6.4 oz (186.2 kg)  07/08/22 (!) 414 lb 14.5 oz (188.2 kg)  07/08/22 (!) 414 lb (187.8 kg)    Physical Exam Vitals reviewed.  Constitutional:      Appearance: He is well-developed.  Neck:      Comments: 2-3cm erythematous abscess draining spontaneously scant purulent discharge Cardiovascular:     Rate and Rhythm: Regular rhythm.     Heart sounds: Normal heart sounds.  Pulmonary:     Effort: Pulmonary effort is normal.  No respiratory distress.     Breath sounds: Normal breath sounds. No wheezing, rhonchi or rales.  Skin:    General: Skin is warm and dry.  Neurological:     Mental Status: He is alert.  Psychiatric:        Speech: Speech normal.        Behavior: Behavior normal.        Assessment & Plan:   Problem List Items Addressed This Visit       Cardiovascular and Mediastinum   Essential hypertension    BP Readings from Last 3 Encounters:  07/13/22 136/70  07/08/22 138/72  03/22/22 124/76  Blood pressure stable today.  Continue losartan 25 mg daily, verapamil 180 mg twice daily and furosemide 20 mg daily as needed         Respiratory   COPD (chronic obstructive pulmonary disease) (HCC)    Chronic, symptomatically stable.  Long discussion as it relates to general anesthesia in the setting of COPD and risk of respiratory compromise, death. Patient has not had follow up with pulmonology since 10/2020. I have placed referral back to pulmonology and reiterated the importance of continued follow up.       Relevant Orders   Ambulatory referral to Pulmonology   Obstructive apnea    Compliant with cipap.       Relevant Orders   Ambulatory referral to Pulmonology     Endocrine   Diabetes mellitus without complication ([place])   Relevant Medications   empagliflozin (JARDIANCE) 10 MG TABS tablet   Type 2 diabetes mellitus with hyperlipidemia (HCC)    Lab Results  Component Value Date   HGBA1C 7.8 (A) 07/13/2022  Patient fortunately has not been on Ozempic  nor Trulicity due to insurance.  We will trial Jardiance.  Continue metformin 205m qd.       Relevant Medications   empagliflozin (JARDIANCE) 10 MG TABS tablet     Other   Abscess - Primary  Patient well-appearing and nontoxic in appearance.  He has a spontaneously draining abscess left side of his neck.  Wound culture obtained today.  He did have improvement with doxycycline recently from urgent care.  Planning on epidural cyst  removal 12/8 with Dr Lysle Pearl.  I consulted with Dr Lysle Pearl over secure chat regarding concern for A1c , risk of infection, poor healing.  Discussed these concerns at length with patient. Dr Lysle Pearl stated: I will not recommend delaying the surgery based on his A1c alone.     He is seeing cardiology whom is providing clearance which I advised in mandatory. Decision per notes to be based on echocardiogram and recent visit 07/08/2022.  I provided surgical clearance from primary and reiterated the importance pulmonology follow-up, risk of poor healing, risk of  infection in the setting of diabetes.  Patient is moderate to high risk for surgery complications.  patient is aware of these risks and would like to proceed.  Pending baseline labs as well and if normal patient can proceed with surgery as planned.       Relevant Orders   CBC with Differential/Platelet   Comprehensive metabolic panel   Protime-INR   Wound culture   Other Visit Diagnoses     Elevated glucose       Relevant Orders   POCT HgB A1C (Completed)        I am having Michael Boyle start on empagliflozin. I am also having him maintain his glucose blood, albuterol, aspirin EC, losartan, rosuvastatin, DULoxetine, fluticasone-salmeterol, metFORMIN, allopurinol, pantoprazole, verapamil, furosemide, and Klor-Con M20.   Meds ordered this encounter  Medications   empagliflozin (JARDIANCE) 10 MG TABS tablet    Sig: Take 1 tablet (10 mg total) by mouth daily before breakfast.    Dispense:  30 tablet    Refill:  2    Order Specific Question:   Supervising Provider    Answer:   Crecencio Mc [[id number]]    Return precautions given.   Risks, benefits, and alternatives of the medications and treatment plan prescribed today were discussed, and patient expressed understanding.   Education regarding symptom management and diagnosis given to patient on AVS.  Continue to follow with Burnard Hawthorne, FNP for routine health maintenance.    Michael Boyle and I agreed with plan.   Mable Paris, FNP  I have spent 40 minutes with a patient including precharting, exam, reviewing medical records, and discussion plan of care.

## 2022-07-13 NOTE — Progress Notes (Signed)
Discussed during OV. Please see OV notes

## 2022-07-13 NOTE — Assessment & Plan Note (Signed)
Compliant with cipap.

## 2022-07-13 NOTE — Assessment & Plan Note (Signed)
Chronic, symptomatically stable.  Long discussion as it relates to general anesthesia in the setting of COPD and risk of respiratory compromise, death. Patient has not had follow up with pulmonology since 10/2020. I have placed referral back to pulmonology and reiterated the importance of continued follow up.

## 2022-07-13 NOTE — Telephone Encounter (Addendum)
   Patient Name: Michael Boyle  DOB: 19-Jun-1966 MRN: [id number]  Primary Cardiologist: Kathlyn Sacramento, MD  Chart reviewed as part of pre-operative protocol coverage. Given past medical history and time since last visit, based on ACC/AHA guidelines, Raymund L Coaxum is at acceptable risk for the planned procedure without further cardiovascular testing.  Patient was seen in clinic on 08/27/2021 and reported some mild shortness of breath.  Echocardiogram was ordered and was stable.  Per Tora Perches, NP, if echo stable, okay to proceed with clearance.  Per office protocol, he may hold Aspirin for 5-7 days prior to procedure. Please resume Aspirin as soon as possible postprocedure, at the discretion of the surgeon.   I will route this recommendation to the requesting party via Epic fax function and remove from pre-op pool.  Please call with questions.  Lenna Sciara, NP 07/13/2022, 2:52 PM

## 2022-07-13 NOTE — Assessment & Plan Note (Signed)
Lab Results  Component Value Date   HGBA1C 7.8 (A) 07/13/2022   Patient fortunately has not been on Ozempic  nor Trulicity due to insurance.  We will trial Jardiance.  Continue metformin '2000mg'$  qd.

## 2022-07-13 NOTE — Assessment & Plan Note (Addendum)
Patient well-appearing and nontoxic in appearance.  He has a spontaneously draining abscess left side of his neck.  Wound culture obtained today.  He did have improvement with doxycycline recently from urgent care.  Planning on epidural cyst removal 12/8 with Dr Lysle Pearl.  I consulted with Dr Lysle Pearl over secure chat regarding concern for A1c , risk of infection, poor healing.  Discussed these concerns at length with patient. Dr Lysle Pearl stated: I will not recommend delaying the surgery based on his A1c alone.     He is seeing cardiology whom is providing clearance which I advised in mandatory. Decision per notes to be based on echocardiogram and recent visit 07/08/2022.  I provided surgical clearance from primary and reiterated the importance pulmonology follow-up, risk of poor healing, risk of  infection in the setting of diabetes.  Patient is moderate to high risk for surgery complications.  patient is aware of these risks and would like to proceed.  Pending baseline labs as well and if normal patient can proceed with surgery as planned.

## 2022-07-14 ENCOUNTER — Telehealth: Payer: Self-pay

## 2022-07-14 ENCOUNTER — Encounter: Payer: Self-pay | Admitting: Family

## 2022-07-14 MED ORDER — CHLORHEXIDINE GLUCONATE 0.12 % MT SOLN: 15.0000 mL | Freq: Once | OROMUCOSAL | Status: AC

## 2022-07-14 MED ORDER — CHLORHEXIDINE GLUCONATE CLOTH 2 % EX PADS
6.0000 | MEDICATED_PAD | Freq: Once | CUTANEOUS | Status: AC
  Administered 2022-07-15: 6 via TOPICAL

## 2022-07-14 MED ORDER — ORAL CARE MOUTH RINSE: 15.0000 mL | Freq: Once | OROMUCOSAL | Status: AC

## 2022-07-14 MED ORDER — CEFAZOLIN SODIUM-DEXTROSE 2-4 GM/100ML-% IV SOLN
2.0000 g | INTRAVENOUS | Status: AC
  Administered 2022-07-15: 3 g via INTRAVENOUS

## 2022-07-14 MED ORDER — SODIUM CHLORIDE 0.9 % IV SOLN: INTRAVENOUS | Status: DC

## 2022-07-14 NOTE — Telephone Encounter (Signed)
Spoke to patient this morning per Joycelyn Schmid to see if he had stopped taking the Aspirin prior to his surgery tomorrow on 07/15/22.  Pt stated that he had stopped the aspirin on 07/09/22.

## 2022-07-14 NOTE — Telephone Encounter (Signed)
NOTED

## 2022-07-15 ENCOUNTER — Ambulatory Visit: Payer: 59 | Admitting: Urgent Care

## 2022-07-15 ENCOUNTER — Encounter: Payer: Self-pay | Admitting: Surgery

## 2022-07-15 ENCOUNTER — Encounter
Admission: RE | Disposition: A | Discharge status: Home / Self Care | Payer: Self-pay | Source: Home / Self Care | Attending: Surgery

## 2022-07-15 ENCOUNTER — Ambulatory Visit
Admission: RE | Admit: 2022-07-15 | Discharge: 2022-07-15 | Disposition: A | Discharge status: Home / Self Care | Payer: 59 | Attending: Surgery | Admitting: Surgery

## 2022-07-15 ENCOUNTER — Other Ambulatory Visit: Payer: Self-pay

## 2022-07-15 DIAGNOSIS — J449 Chronic obstructive pulmonary disease, unspecified: Secondary | ICD-10-CM | POA: Diagnosis not present

## 2022-07-15 DIAGNOSIS — L0291 Cutaneous abscess, unspecified: Secondary | ICD-10-CM

## 2022-07-15 DIAGNOSIS — Z01812 Encounter for preprocedural laboratory examination: Secondary | ICD-10-CM

## 2022-07-15 DIAGNOSIS — E119 Type 2 diabetes mellitus without complications: Secondary | ICD-10-CM | POA: Diagnosis not present

## 2022-07-15 DIAGNOSIS — Z7984 Long term (current) use of oral hypoglycemic drugs: Secondary | ICD-10-CM | POA: Insufficient documentation

## 2022-07-15 DIAGNOSIS — K219 Gastro-esophageal reflux disease without esophagitis: Secondary | ICD-10-CM | POA: Diagnosis not present

## 2022-07-15 DIAGNOSIS — L720 Epidermal cyst: Secondary | ICD-10-CM | POA: Insufficient documentation

## 2022-07-15 DIAGNOSIS — Z87891 Personal history of nicotine dependence: Secondary | ICD-10-CM | POA: Diagnosis not present

## 2022-07-15 DIAGNOSIS — L0211 Cutaneous abscess of neck: Secondary | ICD-10-CM | POA: Insufficient documentation

## 2022-07-15 DIAGNOSIS — I10 Essential (primary) hypertension: Secondary | ICD-10-CM | POA: Diagnosis not present

## 2022-07-15 DIAGNOSIS — Z6841 Body Mass Index (BMI) 40.0 and over, adult: Secondary | ICD-10-CM | POA: Insufficient documentation

## 2022-07-15 HISTORY — DX: Atherosclerotic heart disease of native coronary artery without angina pectoris: I25.10

## 2022-07-15 HISTORY — DX: Atherosclerosis of aorta: I70.0

## 2022-07-15 HISTORY — DX: Type 2 diabetes mellitus without complications: E11.9

## 2022-07-15 HISTORY — DX: Essential (primary) hypertension: I10

## 2022-07-15 HISTORY — DX: Supraventricular tachycardia, unspecified: I47.10

## 2022-07-15 HISTORY — DX: Obstructive sleep apnea (adult) (pediatric): G47.33

## 2022-07-15 HISTORY — DX: Other chronic pain: G89.29

## 2022-07-15 HISTORY — DX: Other intervertebral disc degeneration, thoracolumbar region: M51.35

## 2022-07-15 HISTORY — DX: Spondylosis without myelopathy or radiculopathy, thoracic region: M47.814

## 2022-07-15 HISTORY — PX: CYST EXCISION: SHX5701

## 2022-07-15 LAB — GLUCOSE, CAPILLARY
Glucose-Capillary: 171 mg/dL — ABNORMAL HIGH (ref 70–99)
Glucose-Capillary: 143 mg/dL — ABNORMAL HIGH (ref 70–99)

## 2022-07-15 SURGERY — CYST REMOVAL
Anesthesia: General | Site: Neck

## 2022-07-15 MED ORDER — OXYCODONE HCL 5 MG PO TABS: 5.0000 mg | ORAL_TABLET | Freq: Once | ORAL | Status: DC | PRN

## 2022-07-15 MED ORDER — ACETAMINOPHEN 500 MG PO TABS
ORAL_TABLET | ORAL | Status: AC
  Filled 2022-07-15: qty 2

## 2022-07-15 MED ORDER — PROMETHAZINE HCL 25 MG/ML IJ SOLN: 6.2500 mg | INTRAMUSCULAR | Status: DC | PRN

## 2022-07-15 MED ORDER — ONDANSETRON HCL 4 MG/2ML IJ SOLN
INTRAMUSCULAR | Status: DC | PRN
  Administered 2022-07-15: 8 mg via INTRAVENOUS

## 2022-07-15 MED ORDER — MIDAZOLAM HCL 2 MG/2ML IJ SOLN
INTRAMUSCULAR | Status: AC
  Filled 2022-07-15: qty 2

## 2022-07-15 MED ORDER — LIDOCAINE HCL (CARDIAC) PF 100 MG/5ML IV SOSY
PREFILLED_SYRINGE | INTRAVENOUS | Status: DC | PRN
  Administered 2022-07-15: 100 mg via INTRAVENOUS

## 2022-07-15 MED ORDER — LACTATED RINGERS IV SOLN: INTRAVENOUS | Status: DC | PRN

## 2022-07-15 MED ORDER — ACETAMINOPHEN 10 MG/ML IV SOLN: 1000.0000 mg | Freq: Once | INTRAVENOUS | Status: DC | PRN

## 2022-07-15 MED ORDER — FENTANYL CITRATE (PF) 100 MCG/2ML IJ SOLN: 25.0000 ug | INTRAMUSCULAR | Status: DC | PRN

## 2022-07-15 MED ORDER — ROCURONIUM BROMIDE 100 MG/10ML IV SOLN
INTRAVENOUS | Status: DC | PRN
  Administered 2022-07-15: 40 mg via INTRAVENOUS

## 2022-07-15 MED ORDER — CELECOXIB 200 MG PO CAPS
200.0000 mg | ORAL_CAPSULE | Freq: Once | ORAL | Status: AC
  Administered 2022-07-15: 200 mg via ORAL

## 2022-07-15 MED ORDER — FENTANYL CITRATE (PF) 100 MCG/2ML IJ SOLN
INTRAMUSCULAR | Status: DC | PRN
  Administered 2022-07-15 (×2): 50 ug via INTRAVENOUS

## 2022-07-15 MED ORDER — BUPIVACAINE-EPINEPHRINE (PF) 0.5% -1:200000 IJ SOLN
INTRAMUSCULAR | Status: AC
  Filled 2022-07-15: qty 30

## 2022-07-15 MED ORDER — CELECOXIB 200 MG PO CAPS
ORAL_CAPSULE | ORAL | Status: AC
  Filled 2022-07-15: qty 1

## 2022-07-15 MED ORDER — IBUPROFEN 800 MG PO TABS: 800.0000 mg | ORAL_TABLET | Freq: Three times a day (TID) | ORAL | 0 refills | Status: DC | PRN

## 2022-07-15 MED ORDER — CEFAZOLIN SODIUM-DEXTROSE 2-4 GM/100ML-% IV SOLN
INTRAVENOUS | Status: AC
  Filled 2022-07-15: qty 100

## 2022-07-15 MED ORDER — ACETAMINOPHEN 500 MG PO TABS
1000.0000 mg | ORAL_TABLET | Freq: Once | ORAL | Status: AC
  Administered 2022-07-15: 1000 mg via ORAL

## 2022-07-15 MED ORDER — DROPERIDOL 2.5 MG/ML IJ SOLN: 0.6250 mg | Freq: Once | INTRAMUSCULAR | Status: DC | PRN

## 2022-07-15 MED ORDER — ACETAMINOPHEN 325 MG PO TABS: 650.0000 mg | ORAL_TABLET | Freq: Three times a day (TID) | ORAL | 0 refills | Status: AC | PRN

## 2022-07-15 MED ORDER — LIDOCAINE HCL (PF) 1 % IJ SOLN
INTRAMUSCULAR | Status: AC
  Filled 2022-07-15: qty 30

## 2022-07-15 MED ORDER — BUPIVACAINE-EPINEPHRINE 0.5% -1:200000 IJ SOLN
INTRAMUSCULAR | Status: DC | PRN
  Administered 2022-07-15: 3.5 mL

## 2022-07-15 MED ORDER — DEXAMETHASONE SODIUM PHOSPHATE 10 MG/ML IJ SOLN: INTRAMUSCULAR | Status: DC | PRN

## 2022-07-15 MED ORDER — PROPOFOL 10 MG/ML IV BOLUS
INTRAVENOUS | Status: DC | PRN
  Administered 2022-07-15: 50 mg via INTRAVENOUS
  Administered 2022-07-15: 200 mg via INTRAVENOUS

## 2022-07-15 MED ORDER — LIDOCAINE HCL (PF) 1 % IJ SOLN
INTRAMUSCULAR | Status: DC | PRN
  Administered 2022-07-15: 3.5 mL

## 2022-07-15 MED ORDER — MIDAZOLAM HCL 2 MG/2ML IJ SOLN
INTRAMUSCULAR | Status: DC | PRN
  Administered 2022-07-15: 1 mg via INTRAVENOUS

## 2022-07-15 MED ORDER — OXYCODONE HCL 5 MG/5ML PO SOLN: 5.0000 mg | Freq: Once | ORAL | Status: DC | PRN

## 2022-07-15 MED ORDER — HYDROCODONE-ACETAMINOPHEN 5-325 MG PO TABS: 1.0000 | ORAL_TABLET | Freq: Four times a day (QID) | ORAL | 0 refills | Status: DC | PRN

## 2022-07-15 MED ORDER — 0.9 % SODIUM CHLORIDE (POUR BTL) OPTIME
TOPICAL | Status: DC | PRN
  Administered 2022-07-15: 250 mL

## 2022-07-15 MED ORDER — FENTANYL CITRATE (PF) 100 MCG/2ML IJ SOLN
INTRAMUSCULAR | Status: AC
  Filled 2022-07-15: qty 2

## 2022-07-15 MED ORDER — SUGAMMADEX SODIUM 200 MG/2ML IV SOLN
INTRAVENOUS | Status: DC | PRN
  Administered 2022-07-15: 400 mg via INTRAVENOUS

## 2022-07-15 MED ORDER — SUCCINYLCHOLINE CHLORIDE 200 MG/10ML IV SOSY
PREFILLED_SYRINGE | INTRAVENOUS | Status: DC | PRN
  Administered 2022-07-15: 200 mg via INTRAVENOUS

## 2022-07-15 MED ORDER — CHLORHEXIDINE GLUCONATE 0.12 % MT SOLN
OROMUCOSAL | Status: AC
  Administered 2022-07-15: 15 mL via OROMUCOSAL
  Filled 2022-07-15: qty 15

## 2022-07-15 SURGICAL SUPPLY — 37 items
ADH SKN CLS APL DERMABOND .7 (GAUZE/BANDAGES/DRESSINGS) ×1
APL PRP STRL LF DISP 70% ISPRP (MISCELLANEOUS) ×1
BLADE SURG 15 STRL LF DISP TIS (BLADE) ×1 IMPLANT
BLADE SURG 15 STRL SS (BLADE) ×1
CHLORAPREP W/TINT 26 (MISCELLANEOUS) ×1 IMPLANT
DERMABOND ADVANCED .7 DNX12 (GAUZE/BANDAGES/DRESSINGS) ×1 IMPLANT
DRAPE 3/4 80X56 (DRAPES) ×1 IMPLANT
DRAPE LAPAROTOMY 100X77 ABD (DRAPES) ×1 IMPLANT
ELECT CAUTERY BLADE 6.4 (BLADE) ×1 IMPLANT
ELECT REM PT RETURN 9FT ADLT (ELECTROSURGICAL) ×1
ELECTRODE REM PT RTRN 9FT ADLT (ELECTROSURGICAL) ×1 IMPLANT
GAUZE 4X4 16PLY ~~LOC~~+RFID DBL (SPONGE) ×1 IMPLANT
GAUZE PACKING 0.25INX5YD STRL (GAUZE/BANDAGES/DRESSINGS) IMPLANT
GAUZE SPONGE 4X4 12PLY STRL (GAUZE/BANDAGES/DRESSINGS) IMPLANT
GLOVE BIOGEL PI IND STRL 7.0 (GLOVE) ×1 IMPLANT
GLOVE SURG SYN 6.5 ES PF (GLOVE) ×2 IMPLANT
GLOVE SURG SYN 6.5 PF PI (GLOVE) ×1 IMPLANT
GOWN STRL REUS W/ TWL LRG LVL3 (GOWN DISPOSABLE) ×2 IMPLANT
GOWN STRL REUS W/TWL LRG LVL3 (GOWN DISPOSABLE) ×2
KIT TURNOVER KIT A (KITS) ×1 IMPLANT
LABEL OR SOLS (LABEL) ×1 IMPLANT
MANIFOLD NEPTUNE II (INSTRUMENTS) ×1 IMPLANT
NEEDLE HYPO 22GX1.5 SAFETY (NEEDLE) ×1 IMPLANT
NS IRRIG 1000ML POUR BTL (IV SOLUTION) ×1 IMPLANT
PACK BASIN MINOR ARMC (MISCELLANEOUS) ×1 IMPLANT
PACKING GAUZE IODOFORM 1INX5YD (GAUZE/BANDAGES/DRESSINGS) IMPLANT
SUT ETHILON 3-0 FS-10 30 BLK (SUTURE)
SUT MNCRL 4-0 (SUTURE) ×1
SUT MNCRL 4-0 27XMFL (SUTURE) ×1
SUT VIC AB 3-0 SH 27 (SUTURE) ×2
SUT VIC AB 3-0 SH 27X BRD (SUTURE) ×1 IMPLANT
SUTURE EHLN 3-0 FS-10 30 BLK (SUTURE) IMPLANT
SUTURE MNCRL 4-0 27XMF (SUTURE) ×1 IMPLANT
SYR 30ML LL (SYRINGE) ×1 IMPLANT
TOWEL OR 17X26 4PK STRL BLUE (TOWEL DISPOSABLE) ×1 IMPLANT
TRAP FLUID SMOKE EVACUATOR (MISCELLANEOUS) ×1 IMPLANT
WATER STERILE IRR 500ML POUR (IV SOLUTION) ×1 IMPLANT

## 2022-07-15 NOTE — Anesthesia Preprocedure Evaluation (Addendum)
Anesthesia Evaluation  Patient identified by MRN, date of birth, ID band Patient awake    Reviewed: Allergy & Precautions, H&P , NPO status , Patient's Chart, lab work & pertinent test results, reviewed documented beta blocker date and time   History of Anesthesia Complications Negative for: history of anesthetic complications  Airway Mallampati: III  TM Distance: >3 FB Neck ROM: full    Dental no notable dental hx. (+) Partial Upper, Missing, Dental Advidsory Given, Caps   Pulmonary neg shortness of breath, sleep apnea and Continuous Positive Airway Pressure Ventilation , COPD, neg recent URI, former smoker Former Smoker (52.5 pack years; quit 06/2012   Pulmonary exam normal breath sounds clear to auscultation       Cardiovascular Exercise Tolerance: Good hypertension, (-) angina (-) CAD, (-) Past MI, (-) Cardiac Stents and (-) CABG Normal cardiovascular exam+ dysrhythmias (ILBBB, PSVT) (-) Valvular Problems/Murmurs Rhythm:regular Rate:Normal  Most recent TTE was performed on 07/12/2022 that revealed normal left ventricular systolic function with an EF of 60-65%.  There were no regional wall motion abnormalities.  There was mild LVH.  Right ventricular size and function normal.  Left atrium was moderately enlarged.  There was mild mitral valve regurgitation.  All transvalvular gradients were noted to be normal suggesting no evidence of valvular stenosis.   Neuro/Psych negative neurological ROS  negative psych ROS   GI/Hepatic Neg liver ROS,GERD  Medicated and Controlled,,  Endo/Other  diabetes  Morbid obesity  Renal/GU negative Renal ROS  negative genitourinary   Musculoskeletal   Abdominal Normal abdominal exam  (+)   Peds  Hematology negative hematology ROS (+)   Anesthesia Other Findings Past Medical History:   GERD (gastroesophageal reflux disease)                       COPD (chronic obstructive pulmonary disease)  (*                                              Reproductive/Obstetrics negative OB ROS                             Anesthesia Physical Anesthesia Plan  ASA: 3  Anesthesia Plan: General   Post-op Pain Management: Tylenol PO (pre-op)* and Celebrex PO (pre-op)*   Induction: Intravenous  PONV Risk Score and Plan: 1 and Ondansetron, Dexamethasone and Midazolam  Airway Management Planned: Oral ETT  Additional Equipment:   Intra-op Plan:   Post-operative Plan: Extubation in OR  Informed Consent: I have reviewed the patients History and Physical, chart, labs and discussed the procedure including the risks, benefits and alternatives for the proposed anesthesia with the patient or authorized representative who has indicated his/her understanding and acceptance.     Dental Advisory Given  Plan Discussed with: Anesthesiologist, CRNA and Surgeon  Anesthesia Plan Comments:         Anesthesia Quick Evaluation

## 2022-07-15 NOTE — Interval H&P Note (Signed)
History and Physical Interval Note:  07/15/2022 10:31 AM  Michael Boyle  has presented today for surgery, with the diagnosis of Epidermal cyst of neck L72.0 x 2.  The various methods of treatment have been discussed with the patient and family. After consideration of risks, benefits and other options for treatment, the patient has consented to  Procedure(s): CYST REMOVAL (N/A) as a surgical intervention.  The patient's history has been reviewed, patient examined, no change in status, stable for surgery.  I have reviewed the patient's chart and labs.  Questions were answered to the patient's satisfaction.     Mylan Schwarz Lysle Pearl

## 2022-07-15 NOTE — Anesthesia Postprocedure Evaluation (Signed)
Anesthesia Post Note  Patient: Michael Boyle  Procedure(s) Performed: CYST REMOVAL (Neck)  Patient location during evaluation: PACU Anesthesia Type: General Level of consciousness: awake and alert Pain management: pain level controlled Vital Signs Assessment: post-procedure vital signs reviewed and stable Respiratory status: spontaneous breathing, nonlabored ventilation and respiratory function stable Cardiovascular status: blood pressure returned to baseline and stable Postop Assessment: no apparent nausea or vomiting Anesthetic complications: no   No notable events documented.   Last Vitals:  Vitals:   07/15/22 1340 07/15/22 1349  BP: 138/72 (!) 140/70  Pulse: 79 86  Resp: 17 16  Temp:  36.9 C  SpO2: 91% 93%    Last Pain:  Vitals:   07/15/22 1349  TempSrc: Temporal  PainSc:                  [place] Ouch

## 2022-07-15 NOTE — Telephone Encounter (Signed)
noted 

## 2022-07-15 NOTE — Transfer of Care (Signed)
Immediate Anesthesia Transfer of Care Note  Patient: Michael Boyle  Procedure(s) Performed: CYST REMOVAL (Neck)  Patient Location: PACU  Anesthesia Type:General  Level of Consciousness: awake, alert , and oriented  Airway & Oxygen Therapy: Patient Spontanous Breathing  Post-op Assessment: Report given to RN  Post vital signs: Reviewed  Last Vitals:  Vitals Value Taken Time  BP 129/68   Temp    Pulse 80 07/15/22 1317  Resp 22 07/15/22 1317  SpO2 99 % 07/15/22 1317  Vitals shown include unvalidated device data.  Last Pain:  Vitals:   07/15/22 1021  TempSrc: Temporal  PainSc: 0-No pain         Complications: No notable events documented.

## 2022-07-15 NOTE — Op Note (Signed)
Pre-Op Dx: Epidermal cyst and neck abscess Post-Op Dx: Same Anesthesia: GETA EBL: 20 mL Complications:  none apparent Specimen: Neck cyst x 2 Procedure: excisional biopsy of right neck cyst, incision and drainage of left neck cyst Surgeon: Lysle Pearl  Indications for procedure: See H&P  Description of Procedure:  Consent obtained, time out performed.  Patient placed in supine position.  Area sterilized and draped in usual position.  right side addressed first.  Local infused to area previously marked.  4 cm incision made through dermis with 15blade and epidermal cyst noted in subcutaneous layer.  The  4 cm x 3.3 cm x 3 cm cyst then removed from surrounding tissue completely using electrocautery, passed off field pending pathology.  Wound hemostasis noted, then closed in two layer fashion with 3-0 vicryl in interrupted fashion for deep dermal layer, then running 4-0 monocryl in subcuticular fashion for epidermal layer.  Wound then dressed with Dermabond.  Left side addressed next.  Local infused to area previously marked.  4 cm incision made through dermis with 15blade and immediate drainage of purulent material noted.  Elliptical incision made around healing wound and infected tissue removed from surrounding tissue completely using electrocautery, passed off field pending pathology.  Total excision including the epidermal cyst within it measured 3 cm x 3 cm x 4-1/2 cm.  Wound irrigated, hemostasis noted then packed with half-inch iodoform packing 4 x 4 gauze and secured with Medipore tape.  Pt tolerated procedure well, and transferred to PACU in stable condition. Sponge and instrument count correct at end of procedure.

## 2022-07-15 NOTE — Anesthesia Procedure Notes (Signed)
Procedure Name: Intubation Date/Time: 07/15/2022 12:18 PM  Performed by: Gigi Gin, CRNAPre-anesthesia Checklist: Patient identified, Emergency Drugs available, Suction available, Patient being monitored and Timeout performed Patient Re-evaluated:Patient Re-evaluated prior to induction Oxygen Delivery Method: Circle system utilized Preoxygenation: Pre-oxygenation with 100% oxygen Induction Type: IV induction Laryngoscope Size: Mac and 4 Grade View: Grade I Tube type: Oral Tube size: 7.5 mm Number of attempts: 1 Airway Equipment and Method: Patient positioned with wedge pillow and Stylet Placement Confirmation: ETT inserted through vocal cords under direct vision Secured at: 23 cm Dental Injury: Teeth and Oropharynx as per pre-operative assessment  Comments: Easy grade 1 view with good mouth opening and space for easy quick intubation.

## 2022-07-15 NOTE — Discharge Instructions (Signed)
Removal, Care After This sheet gives you information about how to care for yourself after your procedure. Your health care provider may also give you more specific instructions. If you have problems or questions, contact your health care provider. What can I expect after the procedure? After the procedure, it is common to have: Soreness. Bruising. Itching. Follow these instructions at home: site care Follow instructions from your health care provider about how to take care of your site. Make sure you: Wash your hands with soap and water before and after you change your bandage (dressing). If soap and water are not available, use hand sanitizer. Leave stitches (sutures), skin glue, or adhesive strips in place. These skin closures may need to stay in place for 2 weeks or longer. If adhesive strip edges start to loosen and curl up, you may trim the loose edges. Do not remove adhesive strips completely unless your health care provider tells you to do that. If the area bleeds or bruises, apply gentle pressure for 10 minutes. KEEP DRESSING INTACT UNTIL SEEN IN OFFICE ON MONDAY.  CALL MONDAY MORNING TO SCHEDULE TIME  Check your site every day for signs of infection. Check for: Redness, swelling, or pain. Fluid or blood. Warmth. Pus or a bad smell.  General instructions Rest and then return to your normal activities as told by your health care provider.  tylenol and advil as needed for discomfort.  Please alternate between the two every four hours as needed for pain.    Use narcotics, if prescribed, only when tylenol and motrin is not enough to control pain.  325-'650mg'$  every 8hrs to max of '3000mg'$ /24hrs (including the '325mg'$  in every norco dose) for the tylenol.    Advil up to '800mg'$  per dose every 8hrs as needed for pain.   Keep all follow-up visits as told by your health care provider. This is important. Contact a health care provider if: You have redness, swelling, or pain around your site. You  have fluid or blood coming from your site. Your site feels warm to the touch. You have pus or a bad smell coming from your site. You have a fever. Your sutures, skin glue, or adhesive strips loosen or come off sooner than expected. Get help right away if: You have bleeding that does not stop with pressure or a dressing. Summary After the procedure, it is common to have some soreness, bruising, and itching at the site. Follow instructions from your health care provider about how to take care of your site. Check your site every day for signs of infection. Contact a health care provider if you have redness, swelling, or pain around your site, or your site feels warm to the touch. Keep all follow-up visits as told by your health care provider. This is important. This information is not intended to replace advice given to you by your health care provider. Make sure you discuss any questions you have with your health care provider. Document Released: 08/21/2015 Document Revised: 01/22/2018 Document Reviewed: 01/22/2018 Elsevier Interactive Patient Education  [employer].

## 2022-07-16 ENCOUNTER — Encounter: Payer: Self-pay | Admitting: Surgery

## 2022-07-16 ENCOUNTER — Other Ambulatory Visit: Payer: Self-pay

## 2022-07-16 ENCOUNTER — Emergency Department
Admission: EM | Admit: 2022-07-16 | Discharge: 2022-07-16 | Disposition: A | Discharge status: Home / Self Care | Payer: 59 | Attending: Emergency Medicine | Admitting: Emergency Medicine

## 2022-07-16 DIAGNOSIS — M25511 Pain in right shoulder: Secondary | ICD-10-CM | POA: Diagnosis present

## 2022-07-16 DIAGNOSIS — I10 Essential (primary) hypertension: Secondary | ICD-10-CM | POA: Insufficient documentation

## 2022-07-16 DIAGNOSIS — M25512 Pain in left shoulder: Secondary | ICD-10-CM | POA: Diagnosis not present

## 2022-07-16 DIAGNOSIS — Z20822 Contact with and (suspected) exposure to covid-19: Secondary | ICD-10-CM | POA: Diagnosis not present

## 2022-07-16 DIAGNOSIS — B974 Respiratory syncytial virus as the cause of diseases classified elsewhere: Secondary | ICD-10-CM | POA: Insufficient documentation

## 2022-07-16 DIAGNOSIS — B338 Other specified viral diseases: Secondary | ICD-10-CM

## 2022-07-16 DIAGNOSIS — R52 Pain, unspecified: Secondary | ICD-10-CM

## 2022-07-16 LAB — COMPREHENSIVE METABOLIC PANEL
ALT: 30 U/L (ref 0–44)
AST: 50 U/L — ABNORMAL HIGH (ref 15–41)
Albumin: 3.6 g/dL (ref 3.5–5.0)
Alkaline Phosphatase: 58 U/L (ref 38–126)
Anion gap: 5 (ref 5–15)
BUN: 8 mg/dL (ref 6–20)
CO2: 28 mmol/L (ref 22–32)
Calcium: 8.6 mg/dL — ABNORMAL LOW (ref 8.9–10.3)
Chloride: 106 mmol/L (ref 98–111)
Creatinine, Ser: 0.93 mg/dL (ref 0.61–1.24)
GFR, Estimated: >60 mL/min (ref >60)
Glucose, Bld: 190 mg/dL — ABNORMAL HIGH (ref 70–99)
Potassium: 4.1 mmol/L (ref 3.5–5.1)
Sodium: 139 mmol/L (ref 135–145)
Total Bilirubin: 0.5 mg/dL (ref 0.3–1.2)
Total Protein: 7.3 g/dL (ref 6.5–8.1)

## 2022-07-16 LAB — CBC WITH DIFFERENTIAL/PLATELET
Abs Immature Granulocytes: 0.02 10*3/uL (ref 0.00–0.07)
Basophils Absolute: 0.1 10*3/uL (ref 0.0–0.1)
Basophils Relative: 2 %
Eosinophils Absolute: 0.3 10*3/uL (ref 0.0–0.5)
Eosinophils Relative: 4 %
HCT: 44 % (ref 39.0–52.0)
Hemoglobin: 13.4 g/dL (ref 13.0–17.0)
Immature Granulocytes: 0 %
Lymphocytes Relative: 15 %
Lymphs Abs: 1.1 10*3/uL (ref 0.7–4.0)
MCH: 26.1 pg (ref 26.0–34.0)
MCHC: 30.5 g/dL (ref 30.0–36.0)
MCV: 85.8 fL (ref 80.0–100.0)
Monocytes Absolute: 1 10*3/uL (ref 0.1–1.0)
Monocytes Relative: 13 %
Neutro Abs: 5 10*3/uL (ref 1.7–7.7)
Neutrophils Relative %: 66 %
Platelets: 202 10*3/uL (ref 150–400)
RBC: 5.13 MIL/uL (ref 4.22–5.81)
RDW: 13.8 % (ref 11.5–15.5)
WBC: 7.5 10*3/uL (ref 4.0–10.5)
nRBC: 0 % (ref 0.0–0.2)

## 2022-07-16 LAB — RESP PANEL BY RT-PCR (RSV, FLU A&B, COVID)  RVPGX2
Influenza A by PCR: NEGATIVE
Influenza B by PCR: NEGATIVE
Resp Syncytial Virus by PCR: POSITIVE — AB
SARS Coronavirus 2 by RT PCR: NEGATIVE

## 2022-07-16 LAB — WOUND CULTURE
MICRO NUMBER:: 14278950
SPECIMEN QUALITY:: ADEQUATE

## 2022-07-16 LAB — TROPONIN I (HIGH SENSITIVITY): Troponin I (High Sensitivity): 21 ng/L — ABNORMAL HIGH (ref <18)

## 2022-07-16 MED ORDER — PREDNISONE 10 MG (21) PO TBPK: ORAL_TABLET | ORAL | 0 refills | Status: DC

## 2022-07-16 MED ORDER — AZITHROMYCIN 500 MG PO TABS
500.0000 mg | ORAL_TABLET | Freq: Once | ORAL | Status: AC
  Administered 2022-07-16: 500 mg via ORAL
  Filled 2022-07-16: qty 1

## 2022-07-16 MED ORDER — PREDNISONE 20 MG PO TABS
60.0000 mg | ORAL_TABLET | Freq: Once | ORAL | Status: AC
  Administered 2022-07-16: 60 mg via ORAL
  Filled 2022-07-16: qty 3

## 2022-07-16 MED ORDER — AZITHROMYCIN 250 MG PO TABS: ORAL_TABLET | ORAL | 0 refills | Status: AC

## 2022-07-16 NOTE — ED Triage Notes (Signed)
Pt brought in via ems for generalized body aches. Pt states feeling weaker than usual. Pt hx is two cysts removed in his neck yesterday. Pt states he felt normal at time of DC.

## 2022-07-16 NOTE — ED Provider Notes (Signed)
[facility] Provider Note   Event Date/Time   First MD Initiated Contact with Patient 07/16/22 1849     (approximate) History  Generalized Body Aches  HPI Michael Boyle is a 56 y.o. male with past medical history of HTN who presents for 1 day status post 2 cyst removal from the neck area. Pt states that after this procedure he began having BL shoulder pain last night that has now progressed to generalized muscle aches today. Pt also endorses chills but no fevers or diaphoresis. Denies recent travel or sick contacts.  ROS: Patient currently denies any vision changes, tinnitus, difficulty speaking, facial droop, sore throat, chest pain, shortness of breath, abdominal pain, nausea/vomiting/diarrhea, dysuria, or weakness/numbness/paresthesias in any extremity   Physical Exam  Triage Vital Signs: ED Triage Vitals  Enc Vitals Group     BP 07/16/22 1848 (!) 159/80     Pulse Rate 07/16/22 1848 93     Resp 07/16/22 1848 16     Temp 07/16/22 1848 98.7 F (37.1 C)     Temp Source 07/16/22 1848 Oral     SpO2 07/16/22 1848 93 %     Weight 07/16/22 1848 (!) 400 lb (181.4 kg)     Height 07/16/22 1848 '5\' 11"'$  (1.803 m)     Head Circumference --      Peak Flow --      Pain Score 07/16/22 1847 3     Pain Loc --      Pain Edu? --      Excl. in [place]? --    Most recent vital signs: Vitals:   07/16/22 2145 07/16/22 2200  BP:  124/67  Pulse: 88   Resp: 14   Temp:  98.9 F (37.2 C)  SpO2: 91%    General: Awake, oriented x4 CV:  Good peripheral perfusion.  Resp:  Normal effort.  Abd:  No distention.  Other:  Middle aged mobidly obese male laying in bed in no acute distress. Surgical incision site to right and left aspects of the neck CDI w/o purulent drainage or significant surrounding erythema ED Results / Procedures / Treatments  Labs (all labs ordered are listed, but only abnormal results are displayed) Labs Reviewed  RESP PANEL BY RT-PCR (RSV, FLU A&B, COVID)   RVPGX2 - Abnormal; Notable for the following components:      Result Value   Resp Syncytial Virus by PCR POSITIVE (*)    All other components within normal limits  COMPREHENSIVE METABOLIC PANEL - Abnormal; Notable for the following components:   Glucose, Bld 190 (*)    Calcium 8.6 (*)    AST 50 (*)    All other components within normal limits  TROPONIN I (HIGH SENSITIVITY) - Abnormal; Notable for the following components:   Troponin I (High Sensitivity) 21 (*)    All other components within normal limits  CBC WITH DIFFERENTIAL/PLATELET  TROPONIN I (HIGH SENSITIVITY)   EKG ED ECG REPORT I, Naaman Plummer, the attending physician, personally viewed and interpreted this ECG. Date: 07/16/2022 EKG Time: 1932 Rate: 95 Rhythm: normal sinus rhythm QRS Axis: normal Intervals: normal ST/T Wave abnormalities: normal Narrative Interpretation: no evidence of acute ischemia PROCEDURES: Critical Care performed: No .1-3 Lead EKG Interpretation  Performed by: Naaman Plummer, MD Authorized by: Naaman Plummer, MD     Interpretation: normal     ECG rate:  87   ECG rate assessment: normal     Rhythm: sinus rhythm  Ectopy: none     Conduction: normal    MEDICATIONS ORDERED IN ED: Medications  predniSONE (DELTASONE) tablet 60 mg (60 mg Oral Given 07/16/22 2153)  azithromycin (ZITHROMAX) tablet 500 mg (500 mg Oral Given 07/16/22 2153)   IMPRESSION / MDM / ASSESSMENT AND PLAN / ED COURSE  I reviewed the triage vital signs and the nursing notes.                             The patient is on the cardiac monitor to evaluate for evidence of arrhythmia and/or significant heart rate changes. Patient's presentation is most consistent with acute presentation with potential threat to life or bodily function. Otherwise healthy patient presenting with constellation of symptoms likely representing uncomplicated viral symptoms as characterized by generalized muscle aches.  Patient has tested positive  for RSV  Unlikely PTA/RPA: no hot potato voice, no uvular deviation, Unlikely Esophageal rupture: No history of dysphagia Unlikely deep space infection/Ludwigs Low suspicion for CNS infection bacterial sinusitis, or pneumonia given exam and history.  Unlikely Strep or EBV as centor negative and with no pharyngeal exudate, posterior LAD, or splenomegaly.  Will attempt to alleviate symptoms conservatively; no overt indications at this time for antibiotics. No respiratory distress, otherwise relatively well appearing and nontoxic. Will discuss prompt follow up with PMD and strict return precautions.   FINAL CLINICAL IMPRESSION(S) / ED DIAGNOSES   Final diagnoses:  RSV infection  Generalized body aches   Rx / DC Orders   ED Discharge Orders          Ordered    predniSONE (STERAPRED UNI-PAK 21 TAB) 10 MG (21) TBPK tablet        07/16/22 2141    azithromycin (ZITHROMAX Z-PAK) 250 MG tablet        07/16/22 2141           Note:  This document was prepared using Dragon voice recognition software and may include unintentional dictation errors.   Naaman Plummer, MD 07/17/22 0001

## 2022-07-18 LAB — SURGICAL PATHOLOGY

## 2022-07-26 ENCOUNTER — Ambulatory Visit: Payer: 59 | Admitting: Cardiovascular Disease

## 2022-08-19 ENCOUNTER — Encounter: Payer: Self-pay | Admitting: Family

## 2022-08-19 ENCOUNTER — Ambulatory Visit: Payer: 59 | Admitting: Family

## 2022-08-19 VITALS — BP 130/68 | HR 64 | Temp 98.6°F | Ht 71.0 in | Wt 394.8 lb

## 2022-08-19 DIAGNOSIS — I10 Essential (primary) hypertension: Secondary | ICD-10-CM | POA: Diagnosis not present

## 2022-08-19 DIAGNOSIS — E1169 Type 2 diabetes mellitus with other specified complication: Secondary | ICD-10-CM

## 2022-08-19 DIAGNOSIS — E785 Hyperlipidemia, unspecified: Secondary | ICD-10-CM

## 2022-08-19 NOTE — Assessment & Plan Note (Signed)
Chronic, stable. Continue losartan 25 mg daily, verapamil 180 mg twice daily and furosemide 20 mg daily as needed  .  He had  been taking potassium chloride 20 meq 1 tablet daily and then when he took lasix, he would take an additional tablet.  As he is on losartan daily, advised to only take potassium chloride twice daily when he takes Lasix 20 mg.  He verbalized understanding.

## 2022-08-19 NOTE — Assessment & Plan Note (Signed)
Improved glucose readings.  Continue Jardiance 10 mg daily, metformin 2000 mg qd

## 2022-08-19 NOTE — Progress Notes (Signed)
Assessment & Plan:  Essential hypertension Assessment & Plan:  Chronic, stable. Continue losartan 25 mg daily, verapamil 180 mg twice daily and furosemide 20 mg daily as needed  .  He had  been taking potassium chloride 20 meq 1 tablet daily and then when he took lasix, he would take an additional tablet.  As he is on losartan daily, advised to only take potassium chloride twice daily when he takes Lasix 20 mg.  He verbalized understanding.   Type 2 diabetes mellitus with hyperlipidemia (HCC) Assessment & Plan: Improved glucose readings.  Continue Jardiance 10 mg daily, metformin 2000 mg qd      Return precautions given.   Risks, benefits, and alternatives of the medications and treatment plan prescribed today were discussed, and patient expressed understanding.   Education regarding symptom management and diagnosis given to patient on AVS either electronically or printed.  No follow-ups on file.  Michael Paris, FNP  Subjective:    Patient ID: Michael Boyle, male    DOB: September 19, 1965, 57 y.o.   MRN: [id number]  CC: Michael Boyle is a 57 y.o. male who presents today for follow up.   HPI: Feels better today.  No new complaints.  He is lost 16 to 20 pounds eating healthier, less sugar.  Denies shortness of breath, chest pain.  Bilateral leg swelling is unchanged.  He takes Lasix 20 mg approximately 2 to 3 days/week.   DM- trial of jardiance at last visit.  , compliant with metformin '2000mg'$  qd. FBG 136, 124.   HTN- compliant with losartan 25 mg daily, verapamil 180 mg twice daily and furosemide 20 mg daily as needed    Allergies: Patient has no known allergies. Current Outpatient Medications on File Prior to Visit  Medication Sig Dispense Refill   albuterol (VENTOLIN HFA) 108 (90 Base) MCG/ACT inhaler Inhale 2 puffs into the lungs every 6 (six) hours as needed for wheezing or shortness of breath. 1 each 2   allopurinol (ZYLOPRIM) 100 MG tablet Take 1 tablet (100 mg total)  by mouth 2 (two) times daily. 180 tablet 3   aspirin EC 81 MG tablet Take 1 tablet (81 mg total) by mouth daily. 90 tablet 3   DULoxetine (CYMBALTA) 60 MG capsule Take 1 capsule (60 mg total) by mouth daily. 90 capsule 3   fluticasone-salmeterol (ADVAIR DISKUS) 500-50 MCG/ACT AEPB Inhale 1 puff into the lungs in the morning and at bedtime. 3 each 3   furosemide (LASIX) 20 MG tablet Take 1 tablet (20 mg total) by mouth daily as needed. 90 tablet 1   glucose blood test strip Use as instructed 50 each 12   KLOR-CON M20 20 MEQ tablet TAKE 2 TABLETS DAILY (Patient taking differently: Take 20 mEq by mouth daily. TAKES A SECOND TABLET IF TAKES LASIX) 180 tablet 0   losartan (COZAAR) 25 MG tablet Take 1 tablet (25 mg total) by mouth daily. 90 tablet 3   metFORMIN (GLUCOPHAGE-XR) 500 MG 24 hr tablet Take 4 tablets (2,000 mg total) by mouth every evening. 360 tablet 3   pantoprazole (PROTONIX) 40 MG tablet Take 1 tablet (40 mg total) by mouth daily. 90 tablet 3   rosuvastatin (CRESTOR) 10 MG tablet Take 1 tablet (10 mg total) by mouth daily. (Patient taking differently: Take 10 mg by mouth at bedtime.) 90 tablet 3   verapamil (CALAN-SR) 180 MG CR tablet Take 1 tablet (180 mg total) by mouth 2 (two) times daily. (Patient taking differently: Take 360 mg  by mouth at bedtime.) 180 tablet 3   empagliflozin (JARDIANCE) 10 MG TABS tablet Take 1 tablet (10 mg total) by mouth daily before breakfast. (Patient not taking: Reported on 08/19/2022) 30 tablet 2   No current facility-administered medications on file prior to visit.    Review of Systems  Constitutional:  Negative for chills and fever.  Respiratory:  Negative for cough and shortness of breath.   Cardiovascular:  Positive for leg swelling (at baseline). Negative for chest pain and palpitations.  Gastrointestinal:  Negative for nausea and vomiting.      Objective:    BP 130/68   Pulse 64   Temp 98.6 F (37 C) (Oral)   Ht '5\' 11"'$  (1.803 m)   Wt (!)  394 lb 12.8 oz (179.1 kg)   SpO2 94%   BMI 55.06 kg/m  BP Readings from Last 3 Encounters:  08/19/22 130/68  07/16/22 124/67  07/15/22 (!) 140/70   Wt Readings from Last 3 Encounters:  08/19/22 (!) 394 lb 12.8 oz (179.1 kg)  07/16/22 (!) 400 lb (181.4 kg)  07/15/22 (!) 410 lb (186 kg)    Physical Exam Vitals reviewed.  Constitutional:      Appearance: He is well-developed.  Cardiovascular:     Rate and Rhythm: Regular rhythm.     Heart sounds: Normal heart sounds.  Pulmonary:     Effort: Pulmonary effort is normal. No respiratory distress.     Breath sounds: Normal breath sounds. No wheezing, rhonchi or rales.  Skin:    General: Skin is warm and dry.  Neurological:     Mental Status: He is alert.  Psychiatric:        Speech: Speech normal.        Behavior: Behavior normal.

## 2022-09-19 ENCOUNTER — Telehealth: Payer: Self-pay

## 2022-09-19 NOTE — Telephone Encounter (Signed)
[place] obtained via airview.  Will close encounter.

## 2022-09-19 NOTE — Telephone Encounter (Signed)
Lm for patient to request that he bring SD card to 2/13 visit.

## 2022-09-20 ENCOUNTER — Ambulatory Visit: Payer: 59 | Admitting: Internal Medicine

## 2022-09-20 ENCOUNTER — Encounter: Payer: Self-pay | Admitting: Internal Medicine

## 2022-09-20 VITALS — BP 134/72 | HR 67 | Temp 97.9°F | Ht 71.0 in | Wt 395.0 lb

## 2022-09-20 DIAGNOSIS — G4733 Obstructive sleep apnea (adult) (pediatric): Secondary | ICD-10-CM

## 2022-09-20 DIAGNOSIS — J449 Chronic obstructive pulmonary disease, unspecified: Secondary | ICD-10-CM | POA: Diagnosis not present

## 2022-09-20 NOTE — Patient Instructions (Signed)
Excellent job A+ keep up the good work!! Continue CPAP as prescribed, great job on weight loss!!  Continue Inhalers as needed  Follow up Lung cancer screening protocol  Avoid secondhand smoke Avoid SICK contacts Recommend  Masking  when appropriate Recommend Keep up-to-date with vaccinations

## 2022-09-20 NOTE — Progress Notes (Signed)
$@Patiente$  ID: Michael Boyle, male    DOB: 23-Dec-1965, 57 y.o.   MRN: [id number]  SYNOPSIS 57 year old male former smoker seen for pulmonary consult Dec 27, 2019 to establish for COPD and obstructive sleep apnea Medical history is significant for hypertension, diabetes, hyperlipidemia Covid 19 infection 07/2020 -mild case (Declines Vaccine )   TEST/EVENTS :  Home sleep study completed on November 10, 2020 showed severe sleep apnea with an AHI at 47.6/hour and SPO2 low at 77%.  2D echo June 2021 EF 55 to 60%, RVSF is normal   PFTs January 20, 2020 FEV1 66%, ratio 73, FVC 70%, DLCO 59%, no significant bronchodilator response   CT chest low-dose-April 21, 2020 lung RADS 1 as negative.  Mild diffuse bronchial wall thickening with mild emphysema.      CC Follow up COPD Follow up OSA Follow up Lung cancer screening program   HPI Follow up COPD Patient is on Wixela  inhaler daily.  He feels that this works well for him.  He denies any flare of cough or wheezing.   Especially if he has to do any type of incline carry something or go up stairs.  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 No exacerbation at this time No evidence of heart failure at this time No evidence or signs of infection at this time No respiratory distress No fevers, chills, nausea, vomiting, diarrhea No evidence of lower extremity edema No evidence hemoptysis   Follow up Lung cancer screening program CT chest with September 2021 with no suspicious nodules. CT chest with FEB 2023  no suspicious nodules.  OSA Patient has underlying obstructive sleep apnea.  Has been on CPAP for greater than 10 years.  He says he cannot sleep without his CPAP.    Patient required a home sleep study in order to get a new CPAP machine.  This was completed on November 10, 2020.  This showed severe sleep apnea with AHI at 47.6/hour with an SPO2 low at 77%.   Patient says he  benefits from CPAP and feels that he cannot sleep without it. Patient is on auto CPAP 10 to 20 cm H2O.  Patient education on sleep apnea and CPAP usage.  Compliance report reviewed in detail 100% compliance for both days and greater than 4 hours AHI significantly reduced to 1.9  We discussed healthy weight loss.  Current weight is 377 pounds  Patient has lost 39 pounds over the last several months    No Known Allergies  Immunization History  Administered Date(s) Administered   Influenza,inj,Quad PF,6+ Mos 04/24/2014, 05/11/2015, 07/22/2017, 06/26/2018, 05/22/2019, 04/06/2020, 07/29/2021   Pneumococcal Polysaccharide-23 03/26/2018   Tdap 09/17/2021    Past Medical History:  Diagnosis Date   Aortic atherosclerosis (HCC)    Chronic back pain    Colon polyps    COPD (chronic obstructive pulmonary disease) ([place])    Coronary artery calcification seen on CT scan    DDD (degenerative disc disease), thoracolumbar    Genetic testing 12/07/2017   Common Cancers panel (47 genes) @ [facility] - No pathogenic mutations detected   GERD (gastroesophageal reflux disease)    Gout    History of 2019 novel coronavirus disease (COVID-19) 08/16/2021   a.) symptomatic 08/13/2021; tested (+) 08/16/2021 per [facility] acute care notes   HTN (hypertension)    Hyperlipidemia    Incomplete left bundle branch block (LBBB)    a.) TTE 11/25/2015: EF 60-65%, mild LVH, mild  MAC; b.) TTE 10/17/2017: EF 55-65, mod LVH, mild BAE; c.) TTE 01/20/2020: EF 55-60%, no RWMAs; d.) TTE 07/12/2022: EF 60-65%, no RWMAs, mild LVH, mod LAE, mild MR.   Morbid obesity (HCC)    OSA on CPAP    Palpitations    a.) felt to be PSVT   Paroxysmal SVT (supraventricular tachycardia)    Peripheral edema    T2DM (type 2 diabetes mellitus) ([place])    Thoracic spondylosis    Venous insufficiency     Tobacco History: Social History   Tobacco Use  Smoking Status Former   Packs/day: 1.75   Years: 30.00   Total pack years: 52.50   Types:  Cigarettes   Quit date: 06/13/2012   Years since quitting: 10.2  Smokeless Tobacco Never   Counseling given: Not Answered    Outpatient Medications Prior to Visit  Medication Sig Dispense Refill   albuterol (VENTOLIN HFA) 108 (90 Base) MCG/ACT inhaler Inhale 2 puffs into the lungs every 6 (six) hours as needed for wheezing or shortness of breath. 1 each 2   allopurinol (ZYLOPRIM) 100 MG tablet Take 1 tablet (100 mg total) by mouth 2 (two) times daily. 180 tablet 3   aspirin EC 81 MG tablet Take 1 tablet (81 mg total) by mouth daily. 90 tablet 3   DULoxetine (CYMBALTA) 60 MG capsule Take 1 capsule (60 mg total) by mouth daily. 90 capsule 3   empagliflozin (JARDIANCE) 10 MG TABS tablet Take 1 tablet (10 mg total) by mouth daily before breakfast. (Patient not taking: Reported on 08/19/2022) 30 tablet 2   fluticasone-salmeterol (ADVAIR DISKUS) 500-50 MCG/ACT AEPB Inhale 1 puff into the lungs in the morning and at bedtime. 3 each 3   furosemide (LASIX) 20 MG tablet Take 1 tablet (20 mg total) by mouth daily as needed. 90 tablet 1   glucose blood test strip Use as instructed 50 each 12   KLOR-CON M20 20 MEQ tablet TAKE 2 TABLETS DAILY (Patient taking differently: Take 20 mEq by mouth daily. TAKES A SECOND TABLET IF TAKES LASIX) 180 tablet 0   losartan (COZAAR) 25 MG tablet Take 1 tablet (25 mg total) by mouth daily. 90 tablet 3   metFORMIN (GLUCOPHAGE-XR) 500 MG 24 hr tablet Take 4 tablets (2,000 mg total) by mouth every evening. 360 tablet 3   pantoprazole (PROTONIX) 40 MG tablet Take 1 tablet (40 mg total) by mouth daily. 90 tablet 3   rosuvastatin (CRESTOR) 10 MG tablet Take 1 tablet (10 mg total) by mouth daily. (Patient taking differently: Take 10 mg by mouth at bedtime.) 90 tablet 3   verapamil (CALAN-SR) 180 MG CR tablet Take 1 tablet (180 mg total) by mouth 2 (two) times daily. (Patient taking differently: Take 360 mg by mouth at bedtime.) 180 tablet 3   No facility-administered medications  prior to visit.     BP 134/72 (BP Location: Left Arm, Cuff Size: Large)   Pulse 67   Temp 97.9 F (36.6 C) (Temporal)   Ht 5' 11"$  (1.803 m)   Wt (!) 395 lb (179.2 kg)   SpO2 95%   BMI 55.09 kg/m       Review of Systems: Gen:  Denies  fever, sweats, chills weight loss  HEENT: Denies blurred vision, double vision, ear pain, eye pain, hearing loss, nose bleeds, sore throat Cardiac:  No dizziness, chest pain or heaviness, chest tightness,edema, No JVD Resp:   No cough, -sputum production, -shortness of breath,-wheezing, -hemoptysis,  Other:  All other  systems negative    Physical Examination:   General Appearance: No distress  EYES PERRLA, EOM intact.   NECK Supple, No JVD Pulmonary: normal breath sounds, No wheezing.  CardiovascularNormal S1,S2.  No m/r/g.   Abdomen: Benign, Soft, non-tender. ALL OTHER ROS ARE NEGATIVE    Assessment & Plan:   57 year old morbidly obese white male with severe underlying sleep apnea with COPD with previous smoking history with a lung cancer screening program     COPD (chronic obstructive pulmonary disease) ([place]) Compensated on present regimen on Wixela Exacerbation at this time  OSA Severe sleep apnea with excellent control on CPAP AHI reduced to 1.9 previous AHI is 47 Patient uses and benefits from CPAP therapy Auto CPAP 10-20 nasal mask Compliance report reviewed in detail with patient   Continue with Lung Cancer screening program.  February 2023 CT chest lung cancer screening program no nodule seen Follow-up annual screening  Morbid obesity ([place]) Healthy weight loss discussed Current weight 377 pounds patient states he lost 39 pounds over the last several months     MEDICATION ADJUSTMENTS/LABS AND TESTS ORDERED:  Continue Inhalers as needed  Follow up Lung cancer screening protocol  Avoid secondhand smoke Avoid SICK contacts Recommend  Masking  when appropriate Recommend Keep up-to-date with  vaccinations    CURRENT MEDICATIONS REVIEWED AT [place]   Patient  satisfied with Plan of action and management. All questions answered  Follow up 1 year  Total Time Spent  31 mins   Khristian Phillippi Patricia Pesa, M.D.  [facility]  Medical Director [place] Director [facility] Cardio-Pulmonary Department

## 2022-10-03 LAB — HM DIABETES EYE EXAM

## 2022-10-05 ENCOUNTER — Ambulatory Visit
Admission: RE | Admit: 2022-10-05 | Discharge: 2022-10-05 | Disposition: A | Discharge status: Home / Self Care | Payer: 59 | Source: Ambulatory Visit | Attending: Acute Care | Admitting: Acute Care

## 2022-10-05 DIAGNOSIS — Z87891 Personal history of nicotine dependence: Secondary | ICD-10-CM

## 2022-10-06 ENCOUNTER — Other Ambulatory Visit: Payer: Self-pay | Admitting: Acute Care

## 2022-10-06 DIAGNOSIS — Z122 Encounter for screening for malignant neoplasm of respiratory organs: Secondary | ICD-10-CM

## 2022-10-06 DIAGNOSIS — Z87891 Personal history of nicotine dependence: Secondary | ICD-10-CM

## 2022-10-09 ENCOUNTER — Encounter: Payer: Self-pay | Admitting: Family

## 2022-10-09 ENCOUNTER — Telehealth: Payer: Self-pay | Admitting: Family

## 2022-10-09 NOTE — Telephone Encounter (Signed)
Call pt   I received results from your annual CT lung scan from [facility] which showed benign findings of lungs. Evidence of emphysema.   You will need do this again next year so please ensure you are contacted directed from [facility] to schedule.   Please call our office if you are not contacted for another test.

## 2022-10-10 NOTE — Telephone Encounter (Signed)
Lm for pt to cb.

## 2022-10-10 NOTE — Telephone Encounter (Signed)
Pt called back and I read the message to him and he verbalized understanding

## 2022-10-18 ENCOUNTER — Other Ambulatory Visit: Payer: Self-pay

## 2022-10-18 ENCOUNTER — Ambulatory Visit: Payer: 59 | Admitting: Family

## 2022-10-18 ENCOUNTER — Encounter: Payer: Self-pay | Admitting: Family

## 2022-10-18 VITALS — BP 130/70 | HR 57 | Temp 97.7°F | Ht 71.0 in | Wt 380.0 lb

## 2022-10-18 DIAGNOSIS — E119 Type 2 diabetes mellitus without complications: Secondary | ICD-10-CM

## 2022-10-18 DIAGNOSIS — R7309 Other abnormal glucose: Secondary | ICD-10-CM

## 2022-10-18 DIAGNOSIS — M1A2720 Drug-induced chronic gout, left ankle and foot, without tophus (tophi): Secondary | ICD-10-CM

## 2022-10-18 DIAGNOSIS — K219 Gastro-esophageal reflux disease without esophagitis: Secondary | ICD-10-CM

## 2022-10-18 DIAGNOSIS — I479 Paroxysmal tachycardia, unspecified: Secondary | ICD-10-CM

## 2022-10-18 DIAGNOSIS — E1169 Type 2 diabetes mellitus with other specified complication: Secondary | ICD-10-CM | POA: Diagnosis not present

## 2022-10-18 DIAGNOSIS — G8929 Other chronic pain: Secondary | ICD-10-CM

## 2022-10-18 DIAGNOSIS — I10 Essential (primary) hypertension: Secondary | ICD-10-CM

## 2022-10-18 DIAGNOSIS — R609 Edema, unspecified: Secondary | ICD-10-CM

## 2022-10-18 DIAGNOSIS — M549 Dorsalgia, unspecified: Secondary | ICD-10-CM

## 2022-10-18 DIAGNOSIS — E785 Hyperlipidemia, unspecified: Secondary | ICD-10-CM

## 2022-10-18 LAB — POCT GLYCOSYLATED HEMOGLOBIN (HGB A1C): Hemoglobin A1C: 6.7 % — AB (ref 4.0–5.6)

## 2022-10-18 MED ORDER — VERAPAMIL HCL ER 180 MG PO TBCR: 180.0000 mg | EXTENDED_RELEASE_TABLET | Freq: Two times a day (BID) | ORAL | 3 refills | Status: DC

## 2022-10-18 MED ORDER — FUROSEMIDE 40 MG PO TABS: 40.0000 mg | ORAL_TABLET | Freq: Every day | ORAL | 3 refills | Status: AC | PRN

## 2022-10-18 MED ORDER — PANTOPRAZOLE SODIUM 40 MG PO TBEC: 40.0000 mg | DELAYED_RELEASE_TABLET | Freq: Every day | ORAL | 3 refills | Status: DC

## 2022-10-18 MED ORDER — METFORMIN HCL ER 500 MG PO TB24: 2000.0000 mg | ORAL_TABLET | Freq: Every evening | ORAL | 3 refills | Status: DC

## 2022-10-18 MED ORDER — LOSARTAN POTASSIUM 25 MG PO TABS: 25.0000 mg | ORAL_TABLET | Freq: Every day | ORAL | 3 refills | Status: DC

## 2022-10-18 MED ORDER — DULOXETINE HCL 60 MG PO CPEP: 60.0000 mg | ORAL_CAPSULE | Freq: Every day | ORAL | 3 refills | Status: DC

## 2022-10-18 MED ORDER — EMPAGLIFLOZIN 10 MG PO TABS: 10.0000 mg | ORAL_TABLET | Freq: Every day | ORAL | 2 refills | Status: DC

## 2022-10-18 MED ORDER — ALLOPURINOL 100 MG PO TABS: 100.0000 mg | ORAL_TABLET | Freq: Two times a day (BID) | ORAL | 3 refills | Status: DC

## 2022-10-18 NOTE — Assessment & Plan Note (Signed)
Lab Results  Component Value Date   HGBA1C 6.7 (A) 10/18/2022    A1c has improved.  Continue Jardiance 10 mg daily, metformin 2000 mg qd

## 2022-10-18 NOTE — Progress Notes (Signed)
Assessment & Plan:  Elevated glucose -     POCT glycosylated hemoglobin (Hb A1C)  Edema, peripheral -     Furosemide; Take 1 tablet (40 mg total) by mouth daily as needed.  Dispense: 90 tablet; Refill: 3  Type 2 diabetes mellitus with hyperlipidemia [facility]) Assessment & Plan: Lab Results  Component Value Date   HGBA1C 6.7 (A) 10/18/2022    A1c has improved.  Continue Jardiance 10 mg daily, metformin 2000 mg qd   Essential hypertension Assessment & Plan:  Chronic, stable. Continue losartan 25 mg daily, verapamil 180 mg twice daily and furosemide 40 mg daily as needed  . He is taking potassium chloride twice daily only when he takes Lasix 40 mg.  He verbalized understanding.   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  Dispense: 180 tablet; Refill: 3  Chronic back pain, unspecified back location, unspecified back pain laterality -     DULoxetine HCl; Take 1 capsule (60 mg total) by mouth daily.  Dispense: 90 capsule; Refill: 3  Diabetes mellitus without complication (HCC) -     Empagliflozin; Take 1 tablet (10 mg total) by mouth daily before breakfast.  Dispense: 90 tablet; Refill: 2 -     metFORMIN HCl ER; Take 4 tablets (2,000 mg total) by mouth every evening.  Dispense: 360 tablet; Refill: 3  Benign essential hypertension -     Losartan Potassium; Take 1 tablet (25 mg total) by mouth daily.  Dispense: 90 tablet; Refill: 3  Gastroesophageal reflux disease -     Pantoprazole Sodium; Take 1 tablet (40 mg total) by mouth daily.  Dispense: 90 tablet; Refill: 3  Tachycardia, paroxysmal (HCC) -     Verapamil HCl ER; Take 1 tablet (180 mg total) by mouth 2 (two) times daily. Do not take 2 tablets at same time, this is  BID dosing.  Dispense: 180 tablet; Refill: 3     Return precautions given.   Risks, benefits, and alternatives of the medications and treatment plan prescribed today were  discussed, and patient expressed understanding.   Education regarding symptom management and diagnosis given to patient on AVS either electronically or printed.  Return in about 3 months (around 01/18/2023).  Mable Paris, FNP  Subjective:    Patient ID: Michael Boyle, male    DOB: 1966-01-03, 57 y.o.   MRN: [id number]  CC: Michael Boyle is a 57 y.o. male who presents today for follow up.   HPI: Feels well today.  No new complaints.     He has lost 38 pounds with dietary changes. SOB has improved Hypertension-compliant with  Continue losartan 25 mg daily, verapamil 180 mg twice daily and furosemide 20 mg daily as needed  .   Remains compliant with Jardiance milligrams daily, metformin 2000 mg daily Allergies: Patient has no known allergies. Current Outpatient Medications on File Prior to Visit  Medication Sig Dispense Refill   albuterol (VENTOLIN HFA) 108 (90 Base) MCG/ACT inhaler Inhale 2 puffs into the lungs every 6 (six) hours as needed for wheezing or shortness of breath. 1 each 2   aspirin EC 81 MG tablet Take 1 tablet (81 mg total) by mouth daily. 90 tablet 3   fluticasone-salmeterol (ADVAIR DISKUS) 500-50 MCG/ACT AEPB Inhale 1 puff into the lungs in the morning and at bedtime. 3 each 3   glucose blood test strip Use as instructed 50  each 12   KLOR-CON M20 20 MEQ tablet TAKE 2 TABLETS DAILY (Patient taking differently: Take 20 mEq by mouth daily. TAKES A SECOND TABLET IF TAKES LASIX) 180 tablet 0   rosuvastatin (CRESTOR) 10 MG tablet Take 1 tablet (10 mg total) by mouth daily. (Patient taking differently: Take 10 mg by mouth at bedtime.) 90 tablet 3   No current facility-administered medications on file prior to visit.    Review of Systems  Constitutional:  Negative for chills and fever.  Respiratory:  Positive for shortness of breath (chronic, improved). Negative for cough.   Cardiovascular:  Positive for leg swelling. Negative for chest pain and palpitations.   Gastrointestinal:  Negative for nausea and vomiting.      Objective:    BP 130/70   Pulse (!) 57   Temp 97.7 F (36.5 C) (Oral)   Ht '5\' 11"'$  (1.803 m)   Wt (!) 380 lb (172.4 kg)   SpO2 96%   BMI 53.00 kg/m  BP Readings from Last 3 Encounters:  10/18/22 130/70  09/20/22 134/72  08/19/22 130/68   Wt Readings from Last 3 Encounters:  10/18/22 (!) 380 lb (172.4 kg)  09/20/22 (!) 395 lb (179.2 kg)  08/19/22 (!) 394 lb 12.8 oz (179.1 kg)    Physical Exam Vitals reviewed.  Constitutional:      Appearance: He is well-developed.  Cardiovascular:     Rate and Rhythm: Regular rhythm.     Heart sounds: Normal heart sounds.  Pulmonary:     Effort: Pulmonary effort is normal. No respiratory distress.     Breath sounds: Normal breath sounds. No wheezing, rhonchi or rales.  Skin:    General: Skin is warm and dry.  Neurological:     Mental Status: He is alert.  Psychiatric:        Speech: Speech normal.        Behavior: Behavior normal.

## 2022-10-18 NOTE — Assessment & Plan Note (Signed)
Chronic, stable. Continue losartan 25 mg daily, verapamil 180 mg twice daily and furosemide 40 mg daily as needed  . He is taking potassium chloride twice daily only when he takes Lasix 40 mg.  He verbalized understanding.

## 2022-11-04 ENCOUNTER — Other Ambulatory Visit (HOSPITAL_COMMUNITY): Payer: Self-pay

## 2022-11-04 ENCOUNTER — Telehealth: Payer: Self-pay

## 2022-11-04 NOTE — Telephone Encounter (Signed)
Pharmacy Patient Advocate Encounter   Received fax from [facility] for Pantoprazole PA. Completed form and faxed to [place] at [phone]  Status is pending

## 2022-11-05 ENCOUNTER — Other Ambulatory Visit (HOSPITAL_COMMUNITY): Payer: Self-pay

## 2022-11-11 ENCOUNTER — Other Ambulatory Visit (HOSPITAL_COMMUNITY): Payer: Self-pay

## 2022-11-11 NOTE — Telephone Encounter (Signed)
Pt.notified

## 2022-11-11 NOTE — Telephone Encounter (Signed)
Patient Advocate Encounter  Prior Authorization for Pantoprazole has been approved.    Effective dates: 11/04/22 through 11/04/23

## 2022-11-28 ENCOUNTER — Telehealth: Payer: Self-pay | Admitting: Family

## 2022-11-28 DIAGNOSIS — K219 Gastro-esophageal reflux disease without esophagitis: Secondary | ICD-10-CM

## 2022-11-28 MED ORDER — PANTOPRAZOLE SODIUM 40 MG PO TBEC: 40.0000 mg | DELAYED_RELEASE_TABLET | Freq: Every day | ORAL | 3 refills | Status: DC

## 2022-11-28 NOTE — Telephone Encounter (Signed)
Prescription Request  11/28/2022  LOV: 10/18/2022  What is the name of the medication or equipment? pantoprazole (PROTONIX) 40 MG tablet  Have you contacted your pharmacy to request a refill? Yes   Which pharmacy would you like this sent to?   [facility] - [place], [place] - [address] [facility] [address] AT Portal to [facility] [address] [place] [address] Phone: [phone] Fax: [phone]    Patient notified that their request is being sent to the clinical staff for review and that they should receive a response within 2 business days.   Please advise at [facility] [phone]   Pt its requesting a 90 day supply.

## 2022-11-28 NOTE — Telephone Encounter (Signed)
RX sent in pt notified.

## 2023-01-18 ENCOUNTER — Ambulatory Visit: Payer: 59 | Admitting: Family

## 2023-02-17 ENCOUNTER — Telehealth: Payer: Self-pay | Admitting: Family

## 2023-02-17 ENCOUNTER — Encounter: Payer: Self-pay | Admitting: Family

## 2023-02-17 ENCOUNTER — Ambulatory Visit: Payer: 59 | Admitting: Family

## 2023-02-17 VITALS — BP 130/70 | HR 63 | Temp 97.8°F | Ht 68.0 in | Wt 379.2 lb

## 2023-02-17 DIAGNOSIS — E119 Type 2 diabetes mellitus without complications: Secondary | ICD-10-CM | POA: Diagnosis not present

## 2023-02-17 DIAGNOSIS — I10 Essential (primary) hypertension: Secondary | ICD-10-CM

## 2023-02-17 DIAGNOSIS — E1169 Type 2 diabetes mellitus with other specified complication: Secondary | ICD-10-CM

## 2023-02-17 DIAGNOSIS — L989 Disorder of the skin and subcutaneous tissue, unspecified: Secondary | ICD-10-CM

## 2023-02-17 DIAGNOSIS — Z7984 Long term (current) use of oral hypoglycemic drugs: Secondary | ICD-10-CM

## 2023-02-17 DIAGNOSIS — E785 Hyperlipidemia, unspecified: Secondary | ICD-10-CM

## 2023-02-17 DIAGNOSIS — Z125 Encounter for screening for malignant neoplasm of prostate: Secondary | ICD-10-CM | POA: Diagnosis not present

## 2023-02-17 DIAGNOSIS — Z8639 Personal history of other endocrine, nutritional and metabolic disease: Secondary | ICD-10-CM | POA: Diagnosis not present

## 2023-02-17 DIAGNOSIS — R7309 Other abnormal glucose: Secondary | ICD-10-CM

## 2023-02-17 LAB — CBC WITH DIFFERENTIAL/PLATELET
Basophils Absolute: 0.1 10*3/uL (ref 0.0–0.1)
Basophils Relative: 1.2 % (ref 0.0–3.0)
Eosinophils Absolute: 0.2 10*3/uL (ref 0.0–0.7)
Eosinophils Relative: 2.4 % (ref 0.0–5.0)
HCT: 45.5 % (ref 39.0–52.0)
Hemoglobin: 14.6 g/dL (ref 13.0–17.0)
Lymphocytes Relative: 24.6 % (ref 12.0–46.0)
Lymphs Abs: 1.8 10*3/uL (ref 0.7–4.0)
MCHC: 32.2 g/dL (ref 30.0–36.0)
MCV: 82.2 fl (ref 78.0–100.0)
Monocytes Absolute: 0.7 10*3/uL (ref 0.1–1.0)
Monocytes Relative: 9.6 % (ref 3.0–12.0)
Neutro Abs: 4.5 10*3/uL (ref 1.4–7.7)
Neutrophils Relative %: 62.2 % (ref 43.0–77.0)
Platelets: 287 10*3/uL (ref 150.0–400.0)
RBC: 5.53 Mil/uL (ref 4.22–5.81)
RDW: 15.4 % (ref 11.5–15.5)
WBC: 7.2 10*3/uL (ref 4.0–10.5)

## 2023-02-17 LAB — COMPREHENSIVE METABOLIC PANEL
ALT: 18 U/L (ref 0–53)
AST: 16 U/L (ref 0–37)
Albumin: 4.5 g/dL (ref 3.5–5.2)
Alkaline Phosphatase: 51 U/L (ref 39–117)
BUN: 12 mg/dL (ref 6–23)
CO2: 30 mEq/L (ref 19–32)
Calcium: 9.6 mg/dL (ref 8.4–10.5)
Chloride: 99 mEq/L (ref 96–112)
Creatinine, Ser: 0.88 mg/dL (ref 0.40–1.50)
GFR: 95.14 mL/min (ref >60.00)
Glucose, Bld: 100 mg/dL — ABNORMAL HIGH (ref 70–99)
Potassium: 4.1 mEq/L (ref 3.5–5.1)
Sodium: 138 mEq/L (ref 135–145)
Total Bilirubin: 0.8 mg/dL (ref 0.2–1.2)
Total Protein: 7.3 g/dL (ref 6.0–8.3)

## 2023-02-17 LAB — POCT GLYCOSYLATED HEMOGLOBIN (HGB A1C): Hemoglobin A1C: 6.4 % — AB (ref 4.0–5.6)

## 2023-02-17 LAB — LIPID PANEL
Cholesterol: 96 mg/dL (ref 0–200)
HDL: 34.5 mg/dL — ABNORMAL LOW (ref >39.00)
LDL Cholesterol: 37 mg/dL (ref 0–99)
NonHDL: 61.9
Total CHOL/HDL Ratio: 3
Triglycerides: 126 mg/dL (ref 0.0–149.0)
VLDL: 25.2 mg/dL (ref 0.0–40.0)

## 2023-02-17 LAB — MICROALBUMIN / CREATININE URINE RATIO
Creatinine,U: 94.7 mg/dL
Microalb Creat Ratio: 0.7 mg/g (ref 0.0–30.0)
Microalb, Ur: <0.7 mg/dL (ref 0.0–1.9)

## 2023-02-17 LAB — PSA: PSA: 0.81 ng/mL (ref 0.10–4.00)

## 2023-02-17 LAB — VITAMIN D 25 HYDROXY (VIT D DEFICIENCY, FRACTURES): VITD: 21.53 ng/mL — ABNORMAL LOW (ref 30.00–100.00)

## 2023-02-17 NOTE — Assessment & Plan Note (Signed)
Chronic, stable. Continue losartan 25 mg daily, verapamil 180 mg twice daily and furosemide 40 mg daily as needed  . He is taking potassium chloride twice daily only when he takes Lasix 40 mg.  He verbalized understanding. 

## 2023-02-17 NOTE — Patient Instructions (Signed)
We are working appointment for you with [facility] dermatology.

## 2023-02-17 NOTE — Progress Notes (Signed)
Assessment & Plan:  Diabetes mellitus without complication (HCC) -     VITAMIN D 25 Hydroxy (Vit-D Deficiency, Fractures) -     CBC with Differential/Platelet -     Comprehensive metabolic panel -     Lipid panel -     PSA -     Microalbumin / creatinine urine ratio  Elevated glucose -     POCT glycosylated hemoglobin (Hb A1C)  History of vitamin D deficiency -     VITAMIN D 25 Hydroxy (Vit-D Deficiency, Fractures)  Screening for prostate cancer -     PSA  Type 2 diabetes mellitus with hyperlipidemia (HCC) Assessment & Plan: Lab Results  Component Value Date   HGBA1C 6.4 (A) 02/17/2023    A1c has improved.  Continue Jardiance 10 mg daily, metformin 2000 mg qd   Skin lesion Assessment & Plan: Concern for SCC. Call out to [facility] for appointment.    Essential hypertension Assessment & Plan:  Chronic, stable. Continue losartan 25 mg daily, verapamil 180 mg twice daily and furosemide 40 mg daily as needed  . He is taking potassium chloride twice daily only when he takes Lasix 40 mg.  He verbalized understanding.      Return precautions given.   Risks, benefits, and alternatives of the medications and treatment plan prescribed today were discussed, and patient expressed understanding.   Education regarding symptom management and diagnosis given to patient on AVS either electronically or printed.  Return in about 3 months (around 05/20/2023).  Michael Plowman, FNP  Subjective:    Patient ID: Michael Boyle, male    DOB: [birth year], 57 y.o.   MRN: [id number]  CC: Michael Boyle is a 57 y.o. male who presents today for follow up.   HPI: Overall feels well today.  He has noticed left dorsal hand lesion for months, bleeding. Horn like.   No h/o skin cancer      Compliant with jardiance 10mg , metformin 2000mg  every day.   Allergies: Patient has no known allergies. Current Outpatient Medications on File Prior to Visit  Medication Sig Dispense  Refill   albuterol (VENTOLIN HFA) 108 (90 Base) MCG/ACT inhaler Inhale 2 puffs into the lungs every 6 (six) hours as needed for wheezing or shortness of breath. 1 each 2   allopurinol (ZYLOPRIM) 100 MG tablet Take 1 tablet (100 mg total) by mouth 2 (two) times daily. 180 tablet 3   aspirin EC 81 MG tablet Take 1 tablet (81 mg total) by mouth daily. 90 tablet 3   DULoxetine (CYMBALTA) 60 MG capsule Take 1 capsule (60 mg total) by mouth daily. 90 capsule 3   empagliflozin (JARDIANCE) 10 MG TABS tablet Take 1 tablet (10 mg total) by mouth daily before breakfast. 90 tablet 2   fluticasone-salmeterol (ADVAIR DISKUS) 500-50 MCG/ACT AEPB Inhale 1 puff into the lungs in the morning and at bedtime. 3 each 3   furosemide (LASIX) 40 MG tablet Take 1 tablet (40 mg total) by mouth daily as needed. 90 tablet 3   glucose blood test strip Use as instructed 50 each 12   KLOR-CON M20 20 MEQ tablet TAKE 2 TABLETS DAILY (Patient taking differently: Take 20 mEq by mouth daily. TAKES A SECOND TABLET IF TAKES LASIX) 180 tablet 0   losartan (COZAAR) 25 MG tablet Take 1 tablet (25 mg total) by mouth daily. 90 tablet 3   metFORMIN (GLUCOPHAGE-XR) 500 MG 24 hr tablet Take 4 tablets (2,000 mg total) by mouth every evening.  360 tablet 3   pantoprazole (PROTONIX) 40 MG tablet Take 1 tablet (40 mg total) by mouth daily. 90 tablet 3   rosuvastatin (CRESTOR) 10 MG tablet Take 1 tablet (10 mg total) by mouth daily. (Patient taking differently: Take 10 mg by mouth at bedtime.) 90 tablet 3   verapamil (CALAN-SR) 180 MG CR tablet Take 1 tablet (180 mg total) by mouth 2 (two) times daily. Do not take 2 tablets at same time, this is  BID dosing. 180 tablet 3   No current facility-administered medications on file prior to visit.    Review of Systems  Constitutional:  Negative for chills and fever.  HENT:  Negative for congestion.   Respiratory:  Negative for cough.   Cardiovascular:  Negative for chest pain, palpitations and leg  swelling.  Gastrointestinal:  Negative for diarrhea, nausea and vomiting.  Musculoskeletal:  Negative for myalgias.  Skin:  Negative for rash.  Neurological:  Negative for headaches.  Hematological:  Negative for adenopathy.  Psychiatric/Behavioral:  Negative for confusion.       Objective:    BP 130/70   Pulse 63   Temp 97.8 F (36.6 C) (Oral)   Ht 5\' 8"  (1.727 m)   Wt (!) 379 lb 3.2 oz (172 kg)   SpO2 95%   BMI 57.66 kg/m  BP Readings from Last 3 Encounters:  02/17/23 130/70  10/18/22 130/70  09/20/22 134/72   Wt Readings from Last 3 Encounters:  02/17/23 (!) 379 lb 3.2 oz (172 kg)  10/18/22 (!) 380 lb (172.4 kg)  09/20/22 (!) 395 lb (179.2 kg)    Physical Exam Vitals reviewed.  Constitutional:      Appearance: He is well-developed.  Cardiovascular:     Rate and Rhythm: Regular rhythm.     Heart sounds: Normal heart sounds.  Pulmonary:     Effort: Pulmonary effort is normal. No respiratory distress.     Breath sounds: Normal breath sounds. No wheezing, rhonchi or rales.  Skin:    General: Skin is warm and dry.     Comments: Left dorsal aspect of hand flaky horn like lesion.  Nontender.  Nonbleeding  Neurological:     Mental Status: He is alert.  Psychiatric:        Speech: Speech normal.        Behavior: Behavior normal.

## 2023-02-17 NOTE — Assessment & Plan Note (Signed)
Lab Results  Component Value Date   HGBA1C 6.4 (A) 02/17/2023    A1c has improved.  Continue Jardiance 10 mg daily, metformin 2000 mg qd

## 2023-02-17 NOTE — Telephone Encounter (Signed)
[facility] and they stated that the pt has to call and schedule appt. I informed pt he stated that he will call

## 2023-02-17 NOTE — Assessment & Plan Note (Signed)
Concern for SCC. Call out to [facility] for appointment.

## 2023-02-17 NOTE — Telephone Encounter (Signed)
Call [facility] ; pt has seen them 2 years ago  He has suspicious  lesion on left dorsal hand ( concern for [facility])   Please sch appt asap and let me know once scheduled

## 2023-02-22 ENCOUNTER — Telehealth: Payer: Self-pay | Admitting: Family

## 2023-02-22 NOTE — Telephone Encounter (Signed)
Spoke to [place] @ Advice worker..[phone] to check and make sure that they were aware of the recall on the pt Cymbalta. She stated that they were aware of it and they were taking care of it.

## 2023-02-22 NOTE — Telephone Encounter (Signed)
Please call patient pharmacy.  I was notified that the Cymbalta he is taking was recalled.  Please let me know if this has been corrected and patient is on a safe medication.    Please let patient know after you have spoke with pharmacy

## 2023-05-21 ENCOUNTER — Other Ambulatory Visit: Payer: Self-pay | Admitting: Family

## 2023-05-21 DIAGNOSIS — E119 Type 2 diabetes mellitus without complications: Secondary | ICD-10-CM

## 2023-05-22 ENCOUNTER — Encounter: Payer: Self-pay | Admitting: Family

## 2023-05-22 ENCOUNTER — Ambulatory Visit: Payer: 59 | Admitting: Family

## 2023-05-22 VITALS — BP 122/78 | HR 54 | Temp 97.7°F | Ht 68.0 in | Wt 393.8 lb

## 2023-05-22 DIAGNOSIS — I10 Essential (primary) hypertension: Secondary | ICD-10-CM | POA: Diagnosis not present

## 2023-05-22 DIAGNOSIS — J449 Chronic obstructive pulmonary disease, unspecified: Secondary | ICD-10-CM | POA: Diagnosis not present

## 2023-05-22 DIAGNOSIS — E1169 Type 2 diabetes mellitus with other specified complication: Secondary | ICD-10-CM

## 2023-05-22 DIAGNOSIS — Z7984 Long term (current) use of oral hypoglycemic drugs: Secondary | ICD-10-CM

## 2023-05-22 DIAGNOSIS — E785 Hyperlipidemia, unspecified: Secondary | ICD-10-CM

## 2023-05-22 DIAGNOSIS — E119 Type 2 diabetes mellitus without complications: Secondary | ICD-10-CM

## 2023-05-22 DIAGNOSIS — G479 Sleep disorder, unspecified: Secondary | ICD-10-CM

## 2023-05-22 LAB — POCT GLYCOSYLATED HEMOGLOBIN (HGB A1C): Hemoglobin A1C: 6.5 % — AB (ref 4.0–5.6)

## 2023-05-22 MED ORDER — FLUTICASONE-SALMETEROL 500-50 MCG/ACT IN AEPB
1.0000 | INHALATION_SPRAY | Freq: Two times a day (BID) | RESPIRATORY_TRACT | 3 refills | Status: DC
Start: 2023-05-22 — End: 2024-06-24

## 2023-05-22 MED ORDER — EMPAGLIFLOZIN 10 MG PO TABS
10.0000 mg | ORAL_TABLET | Freq: Every day | ORAL | 3 refills | Status: DC
Start: 2023-05-22 — End: 2024-06-24

## 2023-05-22 MED ORDER — ROSUVASTATIN CALCIUM 10 MG PO TABS
10.0000 mg | ORAL_TABLET | Freq: Every day | ORAL | 3 refills | Status: DC
Start: 2023-05-22 — End: 2024-03-18

## 2023-05-22 NOTE — Progress Notes (Unsigned)
Assessment & Plan:  There are no diagnoses linked to this encounter.   Return precautions given.   Risks, benefits, and alternatives of the medications and treatment plan prescribed today were discussed, and patient expressed understanding.   Education regarding symptom management and diagnosis given to patient on AVS either electronically or printed.  Return in about 3 months (around 08/22/2023).  Rennie Plowman, FNP  Subjective:    Patient ID: Michael Boyle, male    DOB: [birth year], 57 y.o.   MRN: [id number]  CC: Michael Boyle is a 57 y.o. male who presents today for follow up.   HPI: He complains of trouble staying asleep , worse    Compliant with Jardiance 10 mg daily, metformin 2000 mg qd  Allergies: Patient has no known allergies. Current Outpatient Medications on File Prior to Visit  Medication Sig Dispense Refill   albuterol (VENTOLIN HFA) 108 (90 Base) MCG/ACT inhaler Inhale 2 puffs into the lungs every 6 (six) hours as needed for wheezing or shortness of breath. 1 each 2   allopurinol (ZYLOPRIM) 100 MG tablet Take 1 tablet (100 mg total) by mouth 2 (two) times daily. 180 tablet 3   aspirin EC 81 MG tablet Take 1 tablet (81 mg total) by mouth daily. 90 tablet 3   DULoxetine (CYMBALTA) 60 MG capsule Take 1 capsule (60 mg total) by mouth daily. 90 capsule 3   empagliflozin (JARDIANCE) 10 MG TABS tablet Take 1 tablet (10 mg total) by mouth daily before breakfast. 90 tablet 2   fluticasone-salmeterol (ADVAIR DISKUS) 500-50 MCG/ACT AEPB Inhale 1 puff into the lungs in the morning and at bedtime. 3 each 3   furosemide (LASIX) 40 MG tablet Take 1 tablet (40 mg total) by mouth daily as needed. 90 tablet 3   glucose blood test strip Use as instructed 50 each 12   KLOR-CON M20 20 MEQ tablet TAKE 2 TABLETS DAILY (Patient taking differently: Take 20 mEq by mouth daily. TAKES A SECOND TABLET IF TAKES LASIX) 180 tablet 0   losartan (COZAAR) 25 MG tablet Take 1 tablet (25 mg  total) by mouth daily. 90 tablet 3   metFORMIN (GLUCOPHAGE-XR) 500 MG 24 hr tablet Take 4 tablets (2,000 mg total) by mouth every evening. 360 tablet 3   pantoprazole (PROTONIX) 40 MG tablet Take 1 tablet (40 mg total) by mouth daily. 90 tablet 3   rosuvastatin (CRESTOR) 10 MG tablet Take 1 tablet (10 mg total) by mouth daily. (Patient taking differently: Take 10 mg by mouth at bedtime.) 90 tablet 3   verapamil (CALAN-SR) 180 MG CR tablet Take 1 tablet (180 mg total) by mouth 2 (two) times daily. Do not take 2 tablets at same time, this is  BID dosing. 180 tablet 3   No current facility-administered medications on file prior to visit.    Review of Systems  Constitutional:  Negative for chills and fever.  HENT:  Negative for congestion, ear pain, rhinorrhea, sinus pressure and sore throat.   Respiratory:  Negative for cough, shortness of breath and wheezing.   Cardiovascular:  Negative for chest pain and palpitations.  Gastrointestinal:  Negative for diarrhea, nausea and vomiting.  Genitourinary:  Negative for dysuria.  Musculoskeletal:  Negative for myalgias.  Skin:  Negative for rash.  Neurological:  Negative for headaches.  Hematological:  Negative for adenopathy.      Objective:    There were no vitals taken for this visit. BP Readings from Last 3 Encounters:  02/17/23 130/70  10/18/22 130/70  09/20/22 134/72   Wt Readings from Last 3 Encounters:  02/17/23 (!) 379 lb 3.2 oz (172 kg)  10/18/22 (!) 380 lb (172.4 kg)  09/20/22 (!) 395 lb (179.2 kg)    Physical Exam Vitals reviewed.  Constitutional:      Appearance: He is well-developed.  Cardiovascular:     Rate and Rhythm: Regular rhythm.     Heart sounds: Normal heart sounds.  Pulmonary:     Effort: Pulmonary effort is normal. No respiratory distress.     Breath sounds: Normal breath sounds. No wheezing, rhonchi or rales.  Skin:    General: Skin is warm and dry.  Neurological:     Mental Status: He is alert.   Psychiatric:        Speech: Speech normal.        Behavior: Behavior normal.

## 2023-05-22 NOTE — Patient Instructions (Addendum)
Essentials for good sleep:   #1 Exercise #2 Limit Caffeine ( no caffeine after lunch) #3 No smart phones, TV prior to bed -- BLUE light is VERY activating and send the brain an 'awake message.'  #4 Go to bed at same time of night each night and get up at same time of day.  #5 Take 0.5 to 5mg  melatonin at 7pm with dinner -this is when natural melatonin will start to increase #6 May try over the counter Unisom for sleep aid

## 2023-05-24 DIAGNOSIS — G479 Sleep disorder, unspecified: Secondary | ICD-10-CM | POA: Insufficient documentation

## 2023-05-24 NOTE — Assessment & Plan Note (Signed)
Chronic, stable. Continue losartan 25 mg daily, verapamil 180 mg twice daily and furosemide 40 mg daily as needed  . He is taking potassium chloride twice daily only when he takes Lasix 40 mg.

## 2023-05-24 NOTE — Assessment & Plan Note (Signed)
Lab Results  Component Value Date   HGBA1C 6.5 (A) 05/22/2023   Chronic, stable.  Continue Jardiance 10 mg daily, metformin 2000 mg qd

## 2023-05-24 NOTE — Assessment & Plan Note (Signed)
Discussed trial of melatonin and also moving cymbalta to morning to see if interfering with sleep. Will follow.

## 2023-06-21 NOTE — Telephone Encounter (Signed)
Error

## 2023-08-06 ENCOUNTER — Other Ambulatory Visit: Payer: Self-pay | Admitting: Acute Care

## 2023-08-06 DIAGNOSIS — Z87891 Personal history of nicotine dependence: Secondary | ICD-10-CM

## 2023-08-06 DIAGNOSIS — Z122 Encounter for screening for malignant neoplasm of respiratory organs: Secondary | ICD-10-CM

## 2023-08-22 ENCOUNTER — Encounter: Payer: Self-pay | Admitting: Family

## 2023-08-22 ENCOUNTER — Ambulatory Visit: Payer: 59 | Admitting: Family

## 2023-08-22 VITALS — BP 120/76 | HR 67 | Temp 97.7°F | Ht 71.0 in | Wt >= 6400 oz

## 2023-08-22 DIAGNOSIS — Z7984 Long term (current) use of oral hypoglycemic drugs: Secondary | ICD-10-CM

## 2023-08-22 DIAGNOSIS — I10 Essential (primary) hypertension: Secondary | ICD-10-CM | POA: Diagnosis not present

## 2023-08-22 DIAGNOSIS — E1169 Type 2 diabetes mellitus with other specified complication: Secondary | ICD-10-CM | POA: Diagnosis not present

## 2023-08-22 DIAGNOSIS — E785 Hyperlipidemia, unspecified: Secondary | ICD-10-CM | POA: Diagnosis not present

## 2023-08-22 DIAGNOSIS — M1A2720 Drug-induced chronic gout, left ankle and foot, without tophus (tophi): Secondary | ICD-10-CM

## 2023-08-22 DIAGNOSIS — R7309 Other abnormal glucose: Secondary | ICD-10-CM

## 2023-08-22 LAB — COMPREHENSIVE METABOLIC PANEL
ALT: 25 U/L (ref 0–53)
AST: 20 U/L (ref 0–37)
Albumin: 4.4 g/dL (ref 3.5–5.2)
Alkaline Phosphatase: 55 U/L (ref 39–117)
BUN: 15 mg/dL (ref 6–23)
CO2: 28 meq/L (ref 19–32)
Calcium: 9.6 mg/dL (ref 8.4–10.5)
Chloride: 101 meq/L (ref 96–112)
Creatinine, Ser: 0.82 mg/dL (ref 0.40–1.50)
GFR: 96.84 mL/min (ref >60.00)
Glucose, Bld: 132 mg/dL — ABNORMAL HIGH (ref 70–99)
Potassium: 4.1 meq/L (ref 3.5–5.1)
Sodium: 140 meq/L (ref 135–145)
Total Bilirubin: 0.4 mg/dL (ref 0.2–1.2)
Total Protein: 7.4 g/dL (ref 6.0–8.3)

## 2023-08-22 LAB — URIC ACID: Uric Acid, Serum: 3.4 mg/dL — ABNORMAL LOW (ref 4.0–7.8)

## 2023-08-22 LAB — POCT GLYCOSYLATED HEMOGLOBIN (HGB A1C): Hemoglobin A1C: 7.1 % — AB (ref 4.0–5.6)

## 2023-08-22 NOTE — Patient Instructions (Addendum)
 As discussed, continue to check your fasting glucose a couple times a week to ensure less than 150.  You may use the link to calculate your estimated A1c in between  visits.    https://professional.diabetes.org/glucose_calc

## 2023-08-22 NOTE — Assessment & Plan Note (Signed)
 Lab Results  Component Value Date   HGBA1C 7.1 (A) 08/22/2023   Some increase from last visit.  Discussed lifestyle modification and following low glycemic diet.  We deferred escalating diabetic regimen at this time Continue Jardiance  10 mg daily, metformin  2000 mg qd

## 2023-08-22 NOTE — Progress Notes (Signed)
 Assessment & Plan:  Elevated glucose -     POCT glycosylated hemoglobin (Hb A1C)  Essential hypertension Assessment & Plan:  Chronic, stable. Continue losartan  25 mg daily, verapamil  180 mg twice daily and furosemide  40 mg daily as needed  . He is taking potassium chloride  twice daily only when he takes Lasix  40 mg.    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  Discussed lifestyle modification and following low glycemic diet.  We deferred escalating diabetic regimen at this time Continue Jardiance  10 mg daily, metformin  2000 mg qd      Return precautions given.   Risks, benefits, and alternatives of the medications and treatment plan prescribed today were discussed, and patient expressed understanding.   Education regarding symptom management and diagnosis given to patient on AVS either electronically or printed.  No follow-ups on file.  [name], FNP  Subjective:    Patient ID: Michael Boyle, male    DOB: 1966-05-05, 58 y.o.   MRN: [id number]  CC: Michael Boyle is a 58 y.o. male who presents today for follow up.   HPI: Feels well today.  No new complaints. Worsened dietary indiscretion over the holidays.  He is compliant with Jardiance , metformin . Denies chest pain, shortness of breath, dysuria   He continues to take Lasix  40 mg as needed for leg swelling   CT chest lung cancer screen is scheduled   Allergies: Patient has no known allergies. Current Outpatient Medications on File Prior to Visit  Medication Sig Dispense Refill   albuterol  (VENTOLIN  HFA) 108 (90 Base) MCG/ACT inhaler Inhale 2 puffs into the lungs every 6 (six) hours as needed for wheezing or shortness of breath. 1 each 2   allopurinol   (ZYLOPRIM ) 100 MG tablet Take 1 tablet (100 mg total) by mouth 2 (two) times daily. 180 tablet 3   aspirin  EC 81 MG tablet Take 1 tablet (81 mg total) by mouth daily. 90 tablet 3   DULoxetine  (CYMBALTA ) 60 MG capsule Take 1 capsule (60 mg total) by mouth daily. 90 capsule 3   empagliflozin  (JARDIANCE ) 10 MG TABS tablet Take 1 tablet (10 mg total) by mouth daily before breakfast. 90 tablet 3   fluticasone -salmeterol (ADVAIR DISKUS) 500-50 MCG/ACT AEPB Inhale 1 puff into the lungs in the morning and at bedtime. 3 each 3   furosemide  (LASIX ) 40 MG tablet Take 1 tablet (40 mg total) by mouth daily as needed. 90 tablet 3   glucose blood test strip Use as instructed 50 each 12   KLOR-CON  M20 20 MEQ tablet TAKE 2 TABLETS DAILY (Patient taking differently: Take 20 mEq by mouth daily. TAKES A SECOND TABLET IF TAKES LASIX ) 180 tablet 0   losartan  (COZAAR ) 25 MG tablet Take 1 tablet (25 mg total) by mouth daily. 90 tablet 3   metFORMIN  (GLUCOPHAGE -XR) 500 MG 24 hr tablet Take 4 tablets (2,000 mg total) by mouth every evening. 360 tablet 3   pantoprazole  (PROTONIX ) 40 MG tablet Take 1 tablet (40 mg total) by mouth daily. 90 tablet 3   rosuvastatin  (CRESTOR ) 10 MG tablet Take 1 tablet (10 mg total) by mouth daily. 90 tablet 3   verapamil  (CALAN -SR) 180 MG CR tablet Take 1 tablet (  180 mg total) by mouth 2 (two) times daily. Do not take 2 tablets at same time, this is  BID dosing. 180 tablet 3   No current facility-administered medications on file prior to visit.    Review of Systems  Constitutional:  Negative for chills and fever.  Respiratory:  Negative for cough.   Cardiovascular:  Negative for chest pain and palpitations.  Gastrointestinal:  Negative for nausea and vomiting.      Objective:    BP 120/76   Pulse 67   Temp 97.7 F (36.5 C) (Oral)   Ht 5' 11 (1.803 m)   Wt (!) 403 lb (182.8 kg)   SpO2 93%   BMI 56.21 kg/m  BP Readings from Last 3 Encounters:  08/22/23 120/76  05/22/23 122/78   02/17/23 130/70   Wt Readings from Last 3 Encounters:  08/22/23 (!) 403 lb (182.8 kg)  05/22/23 (!) 393 lb 12.8 oz (178.6 kg)  02/17/23 (!) 379 lb 3.2 oz (172 kg)    Physical Exam Vitals reviewed.  Constitutional:      Appearance: He is well-developed.  Cardiovascular:     Rate and Rhythm: Regular rhythm.     Heart sounds: Normal heart sounds.  Pulmonary:     Effort: Pulmonary effort is normal. No respiratory distress.     Breath sounds: Normal breath sounds. No wheezing, rhonchi or rales.  Skin:    General: Skin is warm and dry.  Neurological:     Mental Status: He is alert.  Psychiatric:        Speech: Speech normal.        Behavior: Behavior normal.

## 2023-08-22 NOTE — Assessment & Plan Note (Signed)
Chronic, stable. Continue losartan 25 mg daily, verapamil 180 mg twice daily and furosemide 40 mg daily as needed  . He is taking potassium chloride twice daily only when he takes Lasix 40 mg.

## 2023-08-24 ENCOUNTER — Encounter: Payer: Self-pay | Admitting: Family

## 2023-08-24 ENCOUNTER — Other Ambulatory Visit: Payer: Self-pay | Admitting: Family

## 2023-08-24 DIAGNOSIS — M1A2720 Drug-induced chronic gout, left ankle and foot, without tophus (tophi): Secondary | ICD-10-CM

## 2023-08-24 MED ORDER — ALLOPURINOL 100 MG PO TABS: 100.0000 mg | ORAL_TABLET | Freq: Every day | ORAL | Status: DC

## 2023-09-04 ENCOUNTER — Other Ambulatory Visit: Payer: Self-pay | Admitting: Family

## 2023-09-04 DIAGNOSIS — K219 Gastro-esophageal reflux disease without esophagitis: Secondary | ICD-10-CM

## 2023-09-19 ENCOUNTER — Encounter: Payer: Self-pay | Admitting: Acute Care

## 2023-10-09 ENCOUNTER — Inpatient Hospital Stay: Admission: RE | Admit: 2023-10-09 | Payer: 59 | Source: Ambulatory Visit

## 2023-11-27 ENCOUNTER — Ambulatory Visit: Payer: 59 | Admitting: Family

## 2023-12-06 ENCOUNTER — Encounter: Payer: Self-pay | Admitting: Family

## 2023-12-06 ENCOUNTER — Ambulatory Visit: Admitting: Family

## 2023-12-06 VITALS — BP 124/78 | HR 60 | Temp 97.7°F | Ht 71.0 in | Wt >= 6400 oz

## 2023-12-06 DIAGNOSIS — M549 Dorsalgia, unspecified: Secondary | ICD-10-CM | POA: Diagnosis not present

## 2023-12-06 DIAGNOSIS — Z7984 Long term (current) use of oral hypoglycemic drugs: Secondary | ICD-10-CM | POA: Diagnosis not present

## 2023-12-06 DIAGNOSIS — E119 Type 2 diabetes mellitus without complications: Secondary | ICD-10-CM

## 2023-12-06 DIAGNOSIS — H6121 Impacted cerumen, right ear: Secondary | ICD-10-CM

## 2023-12-06 DIAGNOSIS — G8929 Other chronic pain: Secondary | ICD-10-CM

## 2023-12-06 DIAGNOSIS — I10 Essential (primary) hypertension: Secondary | ICD-10-CM | POA: Diagnosis not present

## 2023-12-06 DIAGNOSIS — H6120 Impacted cerumen, unspecified ear: Secondary | ICD-10-CM | POA: Insufficient documentation

## 2023-12-06 LAB — COMPREHENSIVE METABOLIC PANEL WITH GFR
ALT: 21 U/L (ref 0–53)
AST: 19 U/L (ref 0–37)
Albumin: 4.3 g/dL (ref 3.5–5.2)
Alkaline Phosphatase: 57 U/L (ref 39–117)
BUN: 9 mg/dL (ref 6–23)
CO2: 28 meq/L (ref 19–32)
Calcium: 9.1 mg/dL (ref 8.4–10.5)
Chloride: 102 meq/L (ref 96–112)
Creatinine, Ser: 0.82 mg/dL (ref 0.40–1.50)
GFR: 96.64 mL/min (ref >60.00)
Glucose, Bld: 126 mg/dL — ABNORMAL HIGH (ref 70–99)
Potassium: 4 meq/L (ref 3.5–5.1)
Sodium: 140 meq/L (ref 135–145)
Total Bilirubin: 0.7 mg/dL (ref 0.2–1.2)
Total Protein: 6.9 g/dL (ref 6.0–8.3)

## 2023-12-06 LAB — MICROALBUMIN / CREATININE URINE RATIO
Creatinine,U: 79.5 mg/dL
Microalb Creat Ratio: 9.1 mg/g (ref 0.0–30.0)
Microalb, Ur: 0.7 mg/dL (ref 0.0–1.9)

## 2023-12-06 LAB — HEMOGLOBIN A1C: Hgb A1c MFr Bld: 7.5 % — ABNORMAL HIGH (ref 4.6–6.5)

## 2023-12-06 NOTE — Assessment & Plan Note (Addendum)
 Chronic, subacute. Reassuring exam.  No alarm features at this time.  known history of anterolisthesis L4 on L5, degenerative disc disease lumbar XR 2023.  Declines PT referral.  Excercises provided Advised to schedule Tylenol  arthritis. Consider gabapentin .

## 2023-12-06 NOTE — Assessment & Plan Note (Signed)
Chronic, stable. Continue losartan 25 mg daily, verapamil 180 mg twice daily and furosemide 40 mg daily as needed  . He is taking potassium chloride twice daily only when he takes Lasix 40 mg.

## 2023-12-06 NOTE — Assessment & Plan Note (Signed)
 Pending A1c.  Jardiance  is expensive.  Referral to pharmacy to discuss patient assistance continue  Metformin  xr 2000mg  qd.

## 2023-12-06 NOTE — Patient Instructions (Addendum)
 As discussed, let's start by scheduling Tylenol  Arthritis which is a 650mg  tablet .   You may take 1-2 tablets every 8 hours ( scheduled) with maximum of 6 tablets per day. Most adults can safely take 4 pills total per day of Tylenol  Arthritis 650mg  tablet. Do not exceed 6 tablets a day of Tylenol  Arthritis 650mg  tablet   For example , you could take two tablets in the morning ( 8am) and then two tablets again at 4pm.   Maximum daily dose of acetaminophen  4 g per day from all sources.  If you are taking another medication which includes acetaminophen  (Tylenol ) which may be in cough and cold preparations or pain medication such as Percocet, you will need to factor that into your total daily dose to be safe.  Please let me know if any questions  A great article regarding how to safely take and dose tylenol  found below.  Title : 'Acetaminophen  safety: Be cautious but not afraid'  https://www.health.[employer].edu/pain/acetaminophen -safety-be-cautious-but-not-afraid    Referral to pharmacy    Start debrox over the counter 3 days prior to ear lavage  Start claritin or zyrtec over the counter   Call to make an appointment for Annual Lung cancer screen  With CT Chest as you are due    [phone]   Let me know if any issues in doing so.  We discussed CT calcium  score ( SELF PAY option)  to further stratify your overall cardiovascular risk .   An estimate of cost is $150-200 out-of-pocket as not covered by insurance. However please call your insurance company in advance to ensure no other costs so that you do not have any unexpected bills.     I have placed your order to [facility] in [place] as  generally most convenient.   Phone Number to [facility] on [address] is is [phone] to get scheduled.   Please call to get scheduled and if any issues at all in doing so, please let me know.   Below an article from [facility] regarding the test.    https://www.[email or web address].org/imaging/exams-and-procedures/screenings/cardiac-ct#:~:text=A%20cardiac%20CT%20calcium%20score,arteries%20can%20cause%20heart%20attacks.   Exams We Offer: Cardiac CT Calcium  Score  Knowing your score could save your life. A cardiac CT calcium  score, also known as a coronary calcium  scan, is a quick, convenient and noninvasive way of evaluating the amount of calcified (hard) plaque in your heart vessels. The level of calcium  equates to the extent of plaque build-up in your arteries. Plaque in the arteries can cause heart attacks.  The radiologist reads the images and sends your doctor a report with a calcium  score. Patients with higher scores have a greater risk for a heart attack, heart disease or stroke. Knowing your score can help your doctor decide on blood pressure and cholesterol goals that will minimize your risk as much as possible.  The [employer] of Cardiology found that Coronary artery calcification (CAC) is an excellent cardiovascular disease risk marker and can help guide the decision to use cholesterol reducing medications such as statins. A negative calcium  score may reduce the need for statins in otherwise eligible patients.  The exam takes less than 10 minutes, is painless and does not require any IV or oral contrast. At [facility] locations, the out-of-pocket fee without insurance is $75. At the time of scheduling, please let us  know if you want to process the exam through your insurance or self-pay at the rate of $75. Patients who want to self-pay should not submit their insurance  card when checking in for the appointment.   Who should get a Cardiac CT Calcium  Score: Middle age adults at intermediate risk of heart disease Family history of heart disease Borderline high cholesterol, high blood pressure or diabetes Overweight or physical inactivity Uncertain about taking daily preventive medical therapy  Low Back Sprain  or Strain Rehab Ask your health care provider which exercises are safe for you. Do exercises exactly as told by your health care provider and adjust them as directed. It is normal to feel mild stretching, pulling, tightness, or discomfort as you do these exercises. Stop right away if you feel sudden pain or your pain gets worse. Do not begin these exercises until told by your health care provider. Stretching and range-of-motion exercises These exercises warm up your muscles and joints and improve the movement and flexibility of your back. These exercises also help to relieve pain, numbness, and tingling. Lumbar rotation  Lie on your back on a firm bed or the floor with your knees bent. Straighten your arms out to your sides so each arm forms a 90-degree angle (right angle) with a side of your body. Slowly move (rotate) both of your knees to one side of your body until you feel a stretch in your lower back (lumbar). Try not to let your shoulders lift off the floor. Hold this position for __________ seconds. Tense your abdominal muscles and slowly move your knees back to the starting position. Repeat this exercise on the other side of your body. Repeat __________ times. Complete this exercise __________ times a day. Single knee to chest  Lie on your back on a firm bed or the floor with both legs straight. Bend one of your knees. Use your hands to move your knee up toward your chest until you feel a gentle stretch in your lower back and buttock. Hold your leg in this position by holding on to the front of your knee. Keep your other leg as straight as possible. Hold this position for __________ seconds. Slowly return to the starting position. Repeat with your other leg. Repeat __________ times. Complete this exercise __________ times a day. Prone extension on elbows  Lie on your abdomen on a firm bed or the floor (prone position). Prop yourself up on your elbows. Use your arms to help lift  your chest up until you feel a gentle stretch in your abdomen and your lower back. This will place some of your body weight on your elbows. If this is uncomfortable, try stacking pillows under your chest. Your hips should stay down, against the surface that you are lying on. Keep your hip and back muscles relaxed. Hold this position for __________ seconds. Slowly relax your upper body and return to the starting position. Repeat __________ times. Complete this exercise __________ times a day. Strengthening exercises These exercises build strength and endurance in your back. Endurance is the ability to use your muscles for a long time, even after they get tired. Pelvic tilt This exercise strengthens the muscles that lie deep in the abdomen. Lie on your back on a firm bed or the floor with your legs extended. Bend your knees so they are pointing toward the ceiling and your feet are flat on the floor. Tighten your lower abdominal muscles to press your lower back against the floor. This motion will tilt your pelvis so your tailbone points up toward the ceiling instead of pointing to your feet or the floor. To help with this exercise, you may place  a small towel under your lower back and try to push your back into the towel. Hold this position for __________ seconds. Let your muscles relax completely before you repeat this exercise. Repeat __________ times. Complete this exercise __________ times a day. Alternating arm and leg raises  Get on your hands and knees on a firm surface. If you are on a hard floor, you may want to use padding, such as an exercise mat, to cushion your knees. Line up your arms and legs. Your hands should be directly below your shoulders, and your knees should be directly below your hips. Lift your left leg behind you. At the same time, raise your right arm and straighten it in front of you. Do not lift your leg higher than your hip. Do not lift your arm higher than your  shoulder. Keep your abdominal and back muscles tight. Keep your hips facing the ground. Do not arch your back. Keep your balance carefully, and do not hold your breath. Hold this position for __________ seconds. Slowly return to the starting position. Repeat with your right leg and your left arm. Repeat __________ times. Complete this exercise __________ times a day. Abdominal set with straight leg raise  Lie on your back on a firm bed or the floor. Bend one of your knees and keep your other leg straight. Tense your abdominal muscles and lift your straight leg up, 4-6 inches (10-15 cm) off the ground. Keep your abdominal muscles tight and hold this position for __________ seconds. Do not hold your breath. Do not arch your back. Keep it flat against the ground. Keep your abdominal muscles tense as you slowly lower your leg back to the starting position. Repeat with your other leg. Repeat __________ times. Complete this exercise __________ times a day. Single leg lower with bent knees Lie on your back on a firm bed or the floor. Tense your abdominal muscles and lift your feet off the floor, one foot at a time, so your knees and hips are bent in 90-degree angles (right angles). Your knees should be over your hips and your lower legs should be parallel to the floor. Keeping your abdominal muscles tense and your knee bent, slowly lower one of your legs so your toe touches the ground. Lift your leg back up to return to the starting position. Do not hold your breath. Do not let your back arch. Keep your back flat against the ground. Repeat with your other leg. Repeat __________ times. Complete this exercise __________ times a day. Posture and body mechanics Good posture and healthy body mechanics can help to relieve stress in your body's tissues and joints. Body mechanics refers to the movements and positions of your body while you do your daily activities. Posture is part of body mechanics.  Good posture means: Your spine is in its natural S-curve position (neutral). Your shoulders are pulled back slightly. Your head is not tipped forward (neutral). Follow these guidelines to improve your posture and body mechanics in your everyday activities. Standing  When standing, keep your spine neutral and your feet about hip-width apart. Keep a slight bend in your knees. Your ears, shoulders, and hips should line up. When you do a task in which you stand in one place for a long time, place one foot up on a stable object that is 2-4 inches (5-10 cm) high, such as a footstool. This helps keep your spine neutral. Sitting  When sitting, keep your spine neutral and keep your feet  flat on the floor. Use a footrest, if necessary, and keep your thighs parallel to the floor. Avoid rounding your shoulders, and avoid tilting your head forward. When working at a desk or a computer, keep your desk at a height where your hands are slightly lower than your elbows. Slide your chair under your desk so you are close enough to maintain good posture. When working at a computer, place your monitor at a height where you are looking straight ahead and you do not have to tilt your head forward or downward to look at the screen. Resting When lying down and resting, avoid positions that are most painful for you. If you have pain with activities such as sitting, bending, stooping, or squatting, lie in a position in which your body does not bend very much. For example, avoid curling up on your side with your arms and knees near your chest (fetal position). If you have pain with activities such as standing for a long time or reaching with your arms, lie with your spine in a neutral position and bend your knees slightly. Try the following positions: Lying on your side with a pillow between your knees. Lying on your back with a pillow under your knees. Lifting  When lifting objects, keep your feet at least shoulder-width  apart and tighten your abdominal muscles. Bend your knees and hips and keep your spine neutral. It is important to lift using the strength of your legs, not your back. Do not lock your knees straight out. Always ask for help to lift heavy or awkward objects. This information is not intended to replace advice given to you by your health care provider. Make sure you discuss any questions you have with your health care provider. Document Revised: 11/28/2022 Document Reviewed: 10/12/2020 Elsevier Patient Education  2024 [employer].

## 2023-12-06 NOTE — Progress Notes (Signed)
 Assessment & Plan:  Diabetes mellitus without complication (HCC) Assessment & Plan: Pending A1c.  Jardiance  is expensive.  Referral to pharmacy to discuss patient assistance continue  Metformin  xr 2000mg  qd.  Orders: -     Comprehensive metabolic panel with GFR -     Hemoglobin A1c -     AMB Referral [facility] Care Management -     Microalbumin / creatinine urine ratio  Essential hypertension Assessment & Plan:  Chronic, stable. Continue losartan  25 mg daily, verapamil  180 mg twice daily and furosemide  40 mg daily as needed  . He is taking potassium chloride  twice daily only when he takes Lasix  40 mg.    Orders: -     Comprehensive metabolic panel with GFR  Chronic back pain, unspecified back location, unspecified back pain laterality Assessment & Plan: Chronic, subacute. Reassuring exam.  No alarm features at this time.  known history of anterolisthesis L4 on L5, degenerative disc disease lumbar XR 2023.  Declines PT referral.  Excercises provided Advised to schedule Tylenol  arthritis. Consider gabapentin .    Impacted cerumen of right ear Assessment & Plan: Right cerumen impaction.  Advised to DeBrox for 3 to 4 days and schedule nurse visit for irrigation      Return precautions given.   Risks, benefits, and alternatives of the medications and treatment plan prescribed today were discussed, and patient expressed understanding.   Education regarding symptom management and diagnosis given to patient on AVS either electronically or printed.  Return in about 3 months (around 03/06/2024).  Bascom Bossier, FNP  Subjective:    Patient ID: Michael Boyle, male    DOB: 1965/11/06, 58 y.o.   MRN: [id number]  CC: Michael Boyle is a 58 y.o. male who presents today for follow up.   HPI: Complains of right ear pressure x 2 months, episodic  Feels 'stopped up'   No sinus pressure, nasal congestion, sore throat, sneezing  He hasn't tried medication for this.   He uses Q  tips   FBG 122  No cp.   SOB with activity at baseline, unchanged. Walking without SOB. May get 'winded' up couple of flights of stairs. Recovers after 30 seconds.      Occasional left leg posterior numbness to the left thigh, worse after sitting Low back pain with going down the stairs and sitting. Endorses left buttocks pain.  Improves with walking.  Denies saddle anesthesia, urinary or fecal incontinence.   Allergies: Patient has no known allergies. Current Outpatient Medications on File Prior to Visit  Medication Sig Dispense Refill   albuterol  (VENTOLIN  HFA) 108 (90 Base) MCG/ACT inhaler Inhale 2 puffs into the lungs every 6 (six) hours as needed for wheezing or shortness of breath. 1 each 2   allopurinol  (ZYLOPRIM ) 100 MG tablet Take 1 tablet (100 mg total) by mouth daily.     aspirin  EC 81 MG tablet Take 1 tablet (81 mg total) by mouth daily. 90 tablet 3   DULoxetine  (CYMBALTA ) 60 MG capsule Take 1 capsule (60 mg total) by mouth daily. 90 capsule 3   empagliflozin  (JARDIANCE ) 10 MG TABS tablet Take 1 tablet (10 mg total) by mouth daily before breakfast. 90 tablet 3   fluticasone -salmeterol (ADVAIR DISKUS) 500-50 MCG/ACT AEPB Inhale 1 puff into the lungs in the morning and at bedtime. 3 each 3   furosemide  (LASIX ) 40 MG tablet Take 1 tablet (40 mg total) by mouth daily as needed. 90 tablet 3   glucose blood test strip Use  as instructed 50 each 12   KLOR-CON  M20 20 MEQ tablet TAKE 2 TABLETS DAILY (Patient taking differently: Take 20 mEq by mouth daily. TAKES A SECOND TABLET IF TAKES LASIX ) 180 tablet 0   losartan  (COZAAR ) 25 MG tablet Take 1 tablet (25 mg total) by mouth daily. 90 tablet 3   metFORMIN  (GLUCOPHAGE -XR) 500 MG 24 hr tablet Take 4 tablets (2,000 mg total) by mouth every evening. 360 tablet 3   pantoprazole  (PROTONIX ) 40 MG tablet TAKE 1 TABLET DAILY 90 tablet 3   rosuvastatin  (CRESTOR ) 10 MG tablet Take 1 tablet (10 mg total) by mouth daily. 90 tablet 3   verapamil   (CALAN -SR) 180 MG CR tablet Take 1 tablet (180 mg total) by mouth 2 (two) times daily. Do not take 2 tablets at same time, this is  BID dosing. 180 tablet 3   No current facility-administered medications on file prior to visit.    Review of Systems  Constitutional:  Negative for chills and fever.  Respiratory:  Positive for shortness of breath (at baseline). Negative for cough.   Cardiovascular:  Positive for leg swelling (chronic, BL). Negative for chest pain and palpitations.  Gastrointestinal:  Negative for nausea and vomiting.  Musculoskeletal:  Positive for back pain.  Neurological:  Positive for numbness.      Objective:    BP 124/78   Pulse 60   Temp 97.7 F (36.5 C) (Oral)   Ht 5\' 11"  (1.803 m)   Wt (!) 405 lb 12.8 oz (184.1 kg)   SpO2 96%   BMI 56.60 kg/m  BP Readings from Last 3 Encounters:  12/06/23 124/78  08/22/23 120/76  05/22/23 122/78   Wt Readings from Last 3 Encounters:  12/06/23 (!) 405 lb 12.8 oz (184.1 kg)  08/22/23 (!) 403 lb (182.8 kg)  05/22/23 (!) 393 lb 12.8 oz (178.6 kg)    Physical Exam Vitals reviewed.  Constitutional:      Appearance: He is well-developed.  HENT:     Head: Normocephalic and atraumatic.     Right Ear: Hearing, tympanic membrane, ear canal and external ear normal. No decreased hearing noted. No drainage, swelling or tenderness. No middle ear effusion. Tympanic membrane is not injected, erythematous or bulging.     Left Ear: Hearing, tympanic membrane, ear canal and external ear normal. No decreased hearing noted. No drainage, swelling or tenderness.  No middle ear effusion. Tympanic membrane is not injected, erythematous or bulging.     Nose: Nose normal.     Right Sinus: No maxillary sinus tenderness or frontal sinus tenderness.     Left Sinus: No maxillary sinus tenderness or frontal sinus tenderness.     Mouth/Throat:     Pharynx: Uvula midline. No oropharyngeal exudate or posterior oropharyngeal erythema.     Tonsils:  No tonsillar abscesses.  Eyes:     Conjunctiva/sclera: Conjunctivae normal.  Cardiovascular:     Rate and Rhythm: Regular rhythm.     Heart sounds: Normal heart sounds.  Pulmonary:     Effort: Pulmonary effort is normal. No respiratory distress.     Breath sounds: Normal breath sounds. No wheezing, rhonchi or rales.  Musculoskeletal:       Arms:     Lumbar back: No swelling, spasms or tenderness. Normal range of motion. Negative right straight leg raise test and negative left straight leg raise test.     Comments: Full range of motion with flexion, extension, lateral side bends. No pain, numbness, tingling elicited with single leg  raise bilaterally. No rash. Pain over right SI joint.   Lymphadenopathy:     Head:     Right side of head: No submental, submandibular, tonsillar, preauricular, posterior auricular or occipital adenopathy.     Left side of head: No submental, submandibular, tonsillar, preauricular, posterior auricular or occipital adenopathy.     Cervical: No cervical adenopathy.  Skin:    General: Skin is warm and dry.  Neurological:     Mental Status: He is alert.  Psychiatric:        Speech: Speech normal.        Behavior: Behavior normal.

## 2023-12-06 NOTE — Assessment & Plan Note (Signed)
 Right cerumen impaction.  Advised to DeBrox for 3 to 4 days and schedule nurse visit for irrigation

## 2023-12-12 ENCOUNTER — Telehealth: Payer: Self-pay

## 2023-12-12 NOTE — Progress Notes (Signed)
 Care Guide Pharmacy Note  12/12/2023 Name: [name] MRN: [id number] DOB: 01/22/1966  Referred By: Calista Catching, FNP Reason for referral: Complex Care Management (Outreach to schedule with Pharm d )   Michael Boyle is a 59 y.o. year old male who is a primary care patient of Calista Catching, FNP.  Read Camel was referred to the pharmacist for assistance related to: DMII  Successful contact was made with the patient to discuss pharmacy services including being ready for the pharmacist to call at least 5 minutes before the scheduled appointment time and to have medication bottles and any blood pressure readings ready for review. The patient agreed to meet with the pharmacist via telephone visit on (date/time).12/15/2023  Lenton Rail , RMA     [place]  [facility], [facility] Guide  Direct Dial: [phone]  Website: [email or web address]

## 2023-12-14 ENCOUNTER — Encounter (HOSPITAL_COMMUNITY): Payer: Self-pay

## 2023-12-15 ENCOUNTER — Other Ambulatory Visit (HOSPITAL_COMMUNITY): Payer: Self-pay

## 2023-12-15 ENCOUNTER — Telehealth: Payer: Self-pay

## 2023-12-15 ENCOUNTER — Encounter: Payer: Self-pay | Admitting: Pharmacist

## 2023-12-15 ENCOUNTER — Other Ambulatory Visit (INDEPENDENT_AMBULATORY_CARE_PROVIDER_SITE_OTHER): Admitting: Pharmacist

## 2023-12-15 DIAGNOSIS — E119 Type 2 diabetes mellitus without complications: Secondary | ICD-10-CM

## 2023-12-15 NOTE — Telephone Encounter (Signed)
 Pharmacy Patient Advocate Encounter  Insurance verification completed.   The patient is insured through [employer]   Ran test claim for Jardiance . Currently a quantity of 30 is a 30 day supply and the co-pay is $152.58 .   This test claim was processed through [facility]- copay amounts may vary at other pharmacies due to pharmacy/plan contracts, or as the patient moves through the different stages of their insurance plan.

## 2023-12-15 NOTE — Progress Notes (Unsigned)
   12/15/2023 Name: Michael Boyle MRN: [id number] DOB: 1966-05-16  Subjective  Chief Complaint  Patient presents with   Diabetes   Medicaiton Access    Care Team: Primary Care Provider: Calista Catching, FNP  Reason for visit: ?  Michael Boyle is a 58 y.o. male who presents today for a telephone visit with the pharmacist due to medication access concerns regarding their Jardiance . ?   Medication Access: ?  Reports that all medications are not affordable.  Patient notes he has been on Jardiance  for over a year. Believes his first fill of the year was ~$50 though the most recent fill was $458 for 57-month supply so he did not pick it up.  He is not sure if he has a drug deductible this year.  Is not currently using a copay card to reduce cost.   Denies cost concerns with other medications. Inhalers have remained affordable in the new year.   Prescription drug coverage: YES Payor: [employer] / Plan: [employer] [facility]  / Product Type: *No Product type* / .  Summary of Benefit: Unable to locate. Patient will check his email for 2025 Summary of Benefit letter Per [employer] [facility], Jardiance  = preferred brand   Assessment and Plan:   1. Medication Access Patient is eligible for use of copay card though this may not be enough to bring down his $400 copay. Likely high due to a deductible, though was unable to locate this information online for his unique plan.  He believes he will be able to find his "Summary of Benefits" document in his email and will reach out once located.   Current cost: $458 (3 months) = $152 (1 month) Jardiance  copay card: Should bring cost down to $10/month (max monthly benefit -$175)  Future Appointments  Date Time Provider Department Center  12/21/2023  2:30 PM [facility]  03/06/2024  8:00 AM Arnett, Hanley Lew, FNP [facility]    Daron Ellen, PharmD Clinical Pharmacist [facility] [phone]

## 2023-12-18 ENCOUNTER — Encounter: Payer: Self-pay | Admitting: Pharmacist

## 2023-12-18 NOTE — Progress Notes (Signed)
 Medication Access Cc: Jardiance  cost $152/month  Summary of Call:   Called patient's pharmacy and provided them with the savings card information which was saved to patient's profile for future use.   Pharmacy confirms successful claim for $10/month. However, they report patient must be the one to request the refill.  Pharmacy was not able to confirm patient's deductible amount though do state that he does have a deductible that has not been met yet. They are unable to disclose deductible specifics without the member.    Daron Ellen, PharmD Clinical Pharmacist [facility] [phone]

## 2023-12-21 ENCOUNTER — Ambulatory Visit (INDEPENDENT_AMBULATORY_CARE_PROVIDER_SITE_OTHER)

## 2023-12-21 DIAGNOSIS — H6121 Impacted cerumen, right ear: Secondary | ICD-10-CM | POA: Diagnosis not present

## 2023-12-21 NOTE — Progress Notes (Signed)
 Cerumen Impaction Patient presents with Wax Impaction in the right ear. There is a prior history of cerumen impaction. The patient has been using ear drops to loosen wax immediately prior to this visit. The patient denies ear pain.  Before I began I placed a few drops of peroxide in ear and had patient lie down on opposite side. This step was done on the opposite ear as well.    Before I began flushing I checked in each ear so that I can track my progress. I had pt feel the water to see if it was too hot or too cold. Pt verbalized it was okay so I proceeded; periodically asking if patient was okay.    Process was completed & pt denied any pain or dizziness.  Ear were visualized one last time using otoscope prior to provider taking over.

## 2023-12-29 ENCOUNTER — Other Ambulatory Visit (HOSPITAL_COMMUNITY): Payer: Self-pay

## 2023-12-29 ENCOUNTER — Telehealth: Payer: Self-pay

## 2023-12-29 NOTE — Telephone Encounter (Signed)
*  Primary  Pharmacy Patient Advocate Encounter   Received notification from CoverMyMeds that prior authorization for Pantoprazole  Sodium 40MG  dr tablets  is required/requested.   Insurance verification completed.   The patient is insured through [facility] .   Per test claim: PA required; PA submitted to above mentioned insurance via CoverMyMeds Key/confirmation #/EOC [id number] Status is pending

## 2024-01-03 ENCOUNTER — Other Ambulatory Visit (HOSPITAL_COMMUNITY): Payer: Self-pay

## 2024-01-03 NOTE — Telephone Encounter (Signed)
 Pharmacy Patient Advocate Encounter  Received notification from [facility] that Prior Authorization for Pantoprazole  40mg  caps has been APPROVED from 12/29/23 to 12/28/24. Unable to obtain price due to refill too soon rejection, last fill date 12/27/23 next available fill date08/01/25   PA #/Case ID/Reference #: [id number] NP

## 2024-01-03 NOTE — Telephone Encounter (Signed)
Noted. Pt notified via my chart. °

## 2024-01-30 ENCOUNTER — Ambulatory Visit: Admitting: Cardiology

## 2024-02-06 ENCOUNTER — Encounter: Payer: Self-pay | Admitting: Cardiology

## 2024-02-06 ENCOUNTER — Ambulatory Visit: Attending: Cardiology | Admitting: Cardiology

## 2024-02-06 VITALS — BP 120/72 | HR 65 | Ht 71.0 in | Wt >= 6400 oz

## 2024-02-06 DIAGNOSIS — E782 Mixed hyperlipidemia: Secondary | ICD-10-CM

## 2024-02-06 DIAGNOSIS — I471 Supraventricular tachycardia, unspecified: Secondary | ICD-10-CM

## 2024-02-06 DIAGNOSIS — I10 Essential (primary) hypertension: Secondary | ICD-10-CM | POA: Diagnosis not present

## 2024-02-06 DIAGNOSIS — E1169 Type 2 diabetes mellitus with other specified complication: Secondary | ICD-10-CM | POA: Diagnosis not present

## 2024-02-06 DIAGNOSIS — E785 Hyperlipidemia, unspecified: Secondary | ICD-10-CM

## 2024-02-06 DIAGNOSIS — E669 Obesity, unspecified: Secondary | ICD-10-CM

## 2024-02-06 DIAGNOSIS — R600 Localized edema: Secondary | ICD-10-CM

## 2024-02-06 DIAGNOSIS — I2510 Atherosclerotic heart disease of native coronary artery without angina pectoris: Secondary | ICD-10-CM

## 2024-02-06 DIAGNOSIS — G4733 Obstructive sleep apnea (adult) (pediatric): Secondary | ICD-10-CM

## 2024-02-06 NOTE — Patient Instructions (Signed)
 Medication Instructions:  Your physician recommends that you continue on your current medications as directed. Please refer to the Current Medication list given to you today.   *If you need a refill on your cardiac medications before your next appointment, please call your pharmacy*  Lab Work: No labs ordered today  If you have labs (blood work) drawn today and your tests are completely normal, you will receive your results only by: MyChart Message (if you have MyChart) OR A paper copy in the mail If you have any lab test that is abnormal or we need to change your treatment, we will call you to review the results.  Testing/Procedures: No test ordered today   Follow-Up: At [facility], you and your health needs are our priority.  As part of our continuing mission to provide you with exceptional heart care, our providers are all part of one team.  This team includes your primary Cardiologist (physician) and Advanced Practice Providers or APPs (Physician Assistants and Nurse Practitioners) who all work together to provide you with the care you need, when you need it.  Your next appointment:   12 month(s)  Provider:   Antionette Kirks, MD or Ronald Cockayne, NP

## 2024-02-06 NOTE — Progress Notes (Signed)
 Cardiology Office Note   Date:  02/06/2024  ID:  Michael Boyle, DOB Dec 30, 1965, MRN [id number] PCP: Dineen Rollene MATSU, FNP  [facility] Providers Cardiologist:  Deatrice Cage, MD     History of Present Illness Michael Boyle is a 58 y.o. 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   He had previously been followed by Dr. Bosie but transitioned to Dr.Arida for evaluation abnormal EKG.  Paroxysmal tachycardia was found to be PSVT and he was treated with adenosine for termination.  He was managed on verapamil  being followed by Dr. Bosie without recurrent symptoms.  Prior EKG in 2017 was done at his PCP office which showed an incomplete left bundle branch block.  He denied any chest pain or discomfort.  He noted mild exertional dyspnea and has taken torsemide  20 mg daily for lower extremity edema.  He underwent echocardiogram in April 2017 which showed an LVEF of 60 to 65%, unable to assess wall motion, normal LV diastolic function parameters, mildly calcified mitral annulus.  Ischemic evaluation was not felt to be indicated given the patient had no symptoms of chest pain and overall suspicion for ischemic heart disease.  He was continued on verapamil .  In 09/2017 for preprocedural cardiovascular evaluation for colonoscopy he was doing well at that time and noted exertional dyspnea walking up stairs and inclines.  Given his shortness of breath he underwent echocardiogram 10/2017 which showed an LVEF of 55 to 65%, mildly dilated LV with moderate concentric LVH, normal diastolic function, mild biatrial enlargement, mildly to moderately dilated RV with normal systolic function.   He was last seen in clinic/1/23.  At  that time he was required surgical clearance.  He stated overall with a cardiac perspective he was doing well.  Continue to have chronic shortness of breath and dyspnea on exertion stating it worsened over the last year but denied any other associated symptoms.  He was scheduled for an updated echocardiogram.  He returns to clinic today stating that he has been doing well from a cardiac perspective.  He denies any episodes of palpitations, chest pain, shortness of breath.  He occasionally has peripheral edema that he notes in the evening.  He has been compliant with his CPAP.  States that he has been compliant with his current medication regimen without any undue side effects.  Denies any hospitalizations or visits to the emergency department.  ROS: 10 point review of system has been reviewed and considered negative except what is listed in the HPI  Studies Reviewed EKG Interpretation Date/Time:  Tuesday February 06 2024 13:42:23 EDT Ventricular Rate:  65 PR Interval:  164 QRS Duration:  102 QT Interval:  400 QTC Calculation: 416 R Axis:   48  Text Interpretation: Normal sinus rhythm T wave abnormality, consider lateral ischemia Incomplete left bundle branch block When compared with ECG of 16-Jul-2022 19:32, PREVIOUS ECG IS PRESENT No significant change since last tracing Confirmed by Gerard Frederick ([id number]) on 02/06/2024 1:58:42 PM    2D echo 07/12/2022 1. Left ventricular ejection fraction, by estimation, is 60 to 65%. The  left ventricle has normal function. The left ventricle has no regional  wall motion abnormalities. There is mild left ventricular hypertrophy.  Left ventricular diastolic parameters  were normal.  2. Right ventricular systolic function is normal. The right ventricular  size is normal. Tricuspid regurgitation signal is inadequate for assessing  PA pressure.   3. Left atrial size was moderately dilated.   4. The mitral valve is normal in structure. Mild mitral valve   regurgitation. No evidence of mitral stenosis.   5. The aortic valve was not well visualized. Aortic valve regurgitation  is not visualized. No aortic stenosis is present.   6. The inferior vena cava is normal in size with greater than 50%  respiratory variability, suggesting right atrial pressure of 3 mmHg.   2D echo 01/20/2020 1. Left ventricular ejection fraction, by estimation, is 55 to 60%. The  left ventricle has normal function. The left ventricle has no regional  wall motion abnormalities. Left ventricular diastolic parameters were  normal.   2. Right ventricular systolic function is normal. The right ventricular  size is not well visualized.   3. The mitral valve is grossly normal. No evidence of mitral valve  regurgitation.   4. The aortic valve was not well visualized. Aortic valve regurgitation  is not visualized.   Risk Assessment/Calculations           Physical Exam VS:  BP 120/72 (BP Location: Left Arm, Patient Position: Sitting, Cuff Size: Large)   Pulse 65   Ht 5' 11 (1.803 m)   Wt (!) 405 lb 3.2 oz (183.8 kg)   SpO2 92%   BMI 56.51 kg/m        Wt Readings from Last 3 Encounters:  02/06/24 (!) 405 lb 3.2 oz (183.8 kg)  12/06/23 (!) 405 lb 12.8 oz (184.1 kg)  08/22/23 (!) 403 lb (182.8 kg)    GEN: Well nourished, well developed in no acute distress NECK: No JVD; No carotid bruits CARDIAC: RRR, no murmurs, rubs, gallops RESPIRATORY:  Clear to auscultation without rales, wheezing or rhonchi  ABDOMEN: Soft, non-tender, non-distended EXTREMITIES:  No edema; No deformity   ASSESSMENT AND PLAN Paroxysmal 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  He is continued on verapamil  180 mg twice daily.  Denies any reoccurrence.  Coronary atherosclerosis noted on CT scan of the lungs where he continues to deny anginal anginal equivalent symptoms.  EKG today discussed an incomplete left bundle branch block that is unchanged from prior studies.  With his lack of symptoms no  further ischemic workup is needed at this time.  He is continued on aspirin  81 mg and rosuvastatin  10 mg daily.  Chronic exertional dyspnea that he states has remained stable.  Because prior echocardiogram in 2021 revealed LVEF 55 to 60%, no RWMA, no valvular abnormalities.  Likely component of deconditioning and obesity.  Hyperlipidemia with last LDL of 37.  He continues to be on rosuvastatin  10 mg daily.  Upcoming lipid panel with PCP.  Ongoing management per PCP.  Hypertension with blood pressure 120/72.  He has been continued on furosemide  40 mg as needed, losartan  25 mg daily and verapamil  180 mg twice daily.  He has been encouraged to continue to monitor his pressures 1 to 2 hours postmedication administration at home as well.  Lower extremity swelling likely from chronic venous insufficiency where he has swelling throughout the day and when he wakes up in the morning the swelling is gone.  He has continued on furosemide  40 mg as needed without taking frequently.  He has been encouraged to participate in conservative therapy of elevating extremities, if it confirms, decrease in sodium intake, and compression stockings.  Obstructive sleep  apnea where he states that he has been compliant with CPAP therapy.  Type 2 diabetes with his last hemoglobin A1c of 7.5.  He is continued on Jardiance  10 mg daily, and metformin  5 mg 4 tablets every evening.  Ongoing management per his PCP.  Super Obesity with a BMI 56.51.  Complicates overall health and prognosis of chronic medical conditions.  Would benefit from weight loss.  Is working on continued dietary changes.       Dispo: Patient to return to clinic to see MD/APP in 11 to 12 months or sooner if needed for further evaluation.  Signed, Imogean Ciampa, NP

## 2024-03-06 ENCOUNTER — Encounter: Payer: Self-pay | Admitting: Family

## 2024-03-06 ENCOUNTER — Ambulatory Visit: Admitting: Family

## 2024-03-06 VITALS — BP 118/78 | HR 73 | Temp 97.7°F | Ht 71.0 in | Wt >= 6400 oz

## 2024-03-06 DIAGNOSIS — E1169 Type 2 diabetes mellitus with other specified complication: Secondary | ICD-10-CM

## 2024-03-06 DIAGNOSIS — E785 Hyperlipidemia, unspecified: Secondary | ICD-10-CM

## 2024-03-06 DIAGNOSIS — I10 Essential (primary) hypertension: Secondary | ICD-10-CM

## 2024-03-06 DIAGNOSIS — Z7984 Long term (current) use of oral hypoglycemic drugs: Secondary | ICD-10-CM

## 2024-03-06 DIAGNOSIS — R7309 Other abnormal glucose: Secondary | ICD-10-CM

## 2024-03-06 LAB — POCT GLYCOSYLATED HEMOGLOBIN (HGB A1C): Hemoglobin A1C: 7.3 % — AB (ref 4.0–5.6)

## 2024-03-06 NOTE — Patient Instructions (Addendum)
 Call to make an appointment for Annual Lung cancer screen  With CT Chest:  [phone]. Let me know if any issues in doing so.  Essentials for good sleep:   #1 Exercise #2 Limit Caffeine ( no caffeine after lunch) #3 No smart phones, TV prior to bed -- BLUE light is VERY activating and send the brain an 'awake message.'  #4 Go to bed at same time of night each night and get up at same time of day.  #5 Take 0.5 to 5mg  melatonin at 7pm with dinner -this is when natural melatonin will start to increase You may also trial melatonin combination over the counter supplement such as Sleep#3 ( L- theanine, camomile, lavender, valerian root, and melatonin 10mg )   or Qunol Sleep 5 in 1 (melatonin 5mg , Ashwagandha, GABA, valerian root, L-theanine)  Nice to see you!

## 2024-03-06 NOTE — Assessment & Plan Note (Signed)
 Lab Results  Component Value Date   HGBA1C 7.3 (A) 03/06/2024   Improved. Discussed dietary indiscretion.   Discussed lifestyle modification and following low glycemic diet.  We deferred escalating diabetic regimen at this time Continue Jardiance  10 mg daily, metformin  2000 mg qd

## 2024-03-06 NOTE — Assessment & Plan Note (Signed)
 Chronic, excellent control. Continue losartan  25 mg daily, verapamil  180 mg twice daily and furosemide  40 mg daily as needed  . He is taking potassium chloride  twice daily only when he takes Lasix  40 mg.

## 2024-03-06 NOTE — Progress Notes (Signed)
 Assessment & Plan:  Elevated glucose -     POCT glycosylated hemoglobin (Hb A1C)  Essential hypertension Assessment & Plan:  Chronic, excellent control. Continue losartan  25 mg daily, verapamil  180 mg twice daily and furosemide  40 mg daily as needed  . He is taking potassium chloride  twice daily only when he takes Lasix  40 mg.     Type 2 diabetes mellitus with hyperlipidemia [facility]) Assessment & Plan: Lab Results  Component Value Date   HGBA1C 7.3 (A) 03/06/2024   Improved. Discussed dietary indiscretion.   Discussed lifestyle modification and following low glycemic diet.  We deferred escalating diabetic regimen at this time Continue Jardiance  10 mg daily, metformin  2000 mg qd      Return precautions given.   Risks, benefits, and alternatives of the medications and treatment plan prescribed today were discussed, and patient expressed understanding.   Education regarding symptom management and diagnosis given to patient on AVS either electronically or printed.  Return in about 3 months (around 06/06/2024).  [name], FNP  Subjective:    Patient ID: Michael Boyle, male    DOB: 06-Jun-1966, 58 y.o.   MRN: [id number]  CC: Michael Boyle is a 58 y.o. male who presents today for follow up.   HPI: Overall feels well He endorses dietary indiscretion around his birthday.   Compliant with cipap During the work week, he wakes up from sleep after 5-6 hours without being able to fall asleep. Sometimes do to thinking about work or to urinate.   He sleeps well during the weekends.    Denies CP.  SOB improved at this time. She is staying active at work.  Previously insurance didn't cover ozempic ,  trulicity .   Due for CT lung cancer screen Follow up cardiology 02/06/24 paroxysmal SVT, chronic exertional dyspnea, HLD  No medication changes  Allergies: Patient has no known allergies. Current Outpatient Medications on File Prior to Visit  Medication Sig Dispense Refill    albuterol  (VENTOLIN  HFA) 108 (90 Base) MCG/ACT inhaler Inhale 2 puffs into the lungs every 6 (six) hours as needed for wheezing or shortness of breath. 1 each 2   allopurinol  (ZYLOPRIM ) 100 MG tablet Take 1 tablet (100 mg total) by mouth daily.     aspirin  EC 81 MG tablet Take 1 tablet (81 mg total) by mouth daily. 90 tablet 3   DULoxetine  (CYMBALTA ) 60 MG capsule Take 1 capsule (60 mg total) by mouth daily. 90 capsule 3   empagliflozin  (JARDIANCE ) 10 MG TABS tablet Take 1 tablet (10 mg total) by mouth daily before breakfast. 90 tablet 3   fluticasone -salmeterol (ADVAIR DISKUS) 500-50 MCG/ACT AEPB Inhale 1 puff into the lungs in the morning and at bedtime. 3 each 3   furosemide  (LASIX ) 40 MG tablet Take 1 tablet (40 mg total) by mouth daily as needed. 90 tablet 3   glucose blood test strip Use as instructed 50 each 12   KLOR-CON  M20 20 MEQ tablet TAKE 2 TABLETS DAILY (Patient taking differently: Take 20 mEq by mouth daily. TAKES A SECOND TABLET IF TAKES LASIX ) 180 tablet 0   losartan  (COZAAR ) 25 MG tablet Take 1 tablet (25 mg total) by mouth daily. 90 tablet 3   metFORMIN  (GLUCOPHAGE -XR) 500 MG 24 hr tablet Take 4 tablets (2,000 mg total) by mouth every evening. 360 tablet 3   pantoprazole  (PROTONIX ) 40 MG tablet TAKE 1 TABLET DAILY 90 tablet 3   rosuvastatin  (CRESTOR ) 10 MG tablet Take 1 tablet (10 mg total) by  mouth daily. 90 tablet 3   verapamil  (CALAN -SR) 180 MG CR tablet Take 1 tablet (180 mg total) by mouth 2 (two) times daily. Do not take 2 tablets at same time, this is  BID dosing. 180 tablet 3   No current facility-administered medications on file prior to visit.    Review of Systems  Constitutional:  Negative for chills and fever.  Respiratory:  Negative for cough.   Cardiovascular:  Negative for chest pain and palpitations.  Gastrointestinal:  Negative for nausea and vomiting.  Psychiatric/Behavioral:  Positive for sleep disturbance and suicidal ideas. The patient is not  nervous/anxious.       Objective:    BP 118/78   Pulse 73   Temp 97.7 F (36.5 C) (Oral)   Ht 5' 11 (1.803 m)   Wt (!) 404 lb (183.3 kg)   SpO2 94%   BMI 56.35 kg/m  BP Readings from Last 3 Encounters:  03/06/24 118/78  02/06/24 120/72  12/06/23 124/78   Wt Readings from Last 3 Encounters:  03/06/24 (!) 404 lb (183.3 kg)  02/06/24 (!) 405 lb 3.2 oz (183.8 kg)  12/06/23 (!) 405 lb 12.8 oz (184.1 kg)      03/06/2024    7:57 AM 12/06/2023    8:19 AM 08/22/2023    7:58 AM  Depression screen PHQ 2/9  Decreased Interest 0 0 0  Down, Depressed, Hopeless 0 0 0  PHQ - 2 Score 0 0 0  Altered sleeping  0   Tired, decreased energy  0   Change in appetite  0   Feeling bad or failure about yourself   0   Trouble concentrating  0   Moving slowly or fidgety/restless  0   Suicidal thoughts  0   PHQ-9 Score  0   Difficult doing work/chores  Not difficult at all      Physical Exam Vitals reviewed.  Constitutional:      Appearance: He is well-developed.  Cardiovascular:     Rate and Rhythm: Regular rhythm.     Heart sounds: Normal heart sounds.  Pulmonary:     Effort: Pulmonary effort is normal. No respiratory distress.     Breath sounds: Normal breath sounds. No wheezing, rhonchi or rales.  Skin:    General: Skin is warm and dry.  Neurological:     Mental Status: He is alert.  Psychiatric:        Speech: Speech normal.        Behavior: Behavior normal.

## 2024-03-17 ENCOUNTER — Other Ambulatory Visit: Payer: Self-pay | Admitting: Family

## 2024-03-17 DIAGNOSIS — I479 Paroxysmal tachycardia, unspecified: Secondary | ICD-10-CM

## 2024-03-17 DIAGNOSIS — M1A2720 Drug-induced chronic gout, left ankle and foot, without tophus (tophi): Secondary | ICD-10-CM

## 2024-03-17 DIAGNOSIS — I10 Essential (primary) hypertension: Secondary | ICD-10-CM

## 2024-03-17 DIAGNOSIS — E119 Type 2 diabetes mellitus without complications: Secondary | ICD-10-CM

## 2024-03-17 DIAGNOSIS — G8929 Other chronic pain: Secondary | ICD-10-CM

## 2024-04-01 ENCOUNTER — Encounter: Payer: Self-pay | Admitting: Acute Care

## 2024-04-01 ENCOUNTER — Telehealth: Payer: Self-pay | Admitting: Emergency Medicine

## 2024-04-01 NOTE — Telephone Encounter (Signed)
 Copied from CRM #[id number]. Topic: Clinical - Request for Lab/Test Order >> Apr 01, 2024 10:38 AM Corean SAUNDERS wrote: Reason for CRM: Michael Boyle from [facility] states this patient has an CT appointment with them tomorrow but the authorization has expired and is requesting this be extended before 12 PM today or they will need to cancel the patients CT. >> Apr 01, 2024 11:05 AM Charlanne KIDD wrote: Sending to [facility] to reset CT expiration and then fwd to PCC's.

## 2024-04-02 ENCOUNTER — Inpatient Hospital Stay
Admission: RE | Admit: 2024-04-02 | Discharge: 2024-04-02 | Disposition: A | Discharge status: Home / Self Care | Source: Ambulatory Visit | Attending: Acute Care | Admitting: Acute Care

## 2024-04-02 DIAGNOSIS — Z87891 Personal history of nicotine dependence: Secondary | ICD-10-CM

## 2024-04-02 DIAGNOSIS — Z122 Encounter for screening for malignant neoplasm of respiratory organs: Secondary | ICD-10-CM

## 2024-04-15 ENCOUNTER — Other Ambulatory Visit: Payer: Self-pay

## 2024-04-15 DIAGNOSIS — Z122 Encounter for screening for malignant neoplasm of respiratory organs: Secondary | ICD-10-CM

## 2024-04-15 DIAGNOSIS — Z87891 Personal history of nicotine dependence: Secondary | ICD-10-CM

## 2024-05-21 ENCOUNTER — Telehealth: Payer: Self-pay | Admitting: Family

## 2024-05-21 NOTE — Telephone Encounter (Signed)
 Call pt Michael Boyle VV Or OV with me to discuss findings on CT lung cancer screen Concerned with   Pulmonary artery enlargement suggests pulmonary arterial hypertension. Aortic Atherosclerosis  Coronary artery atherosclerosis.

## 2024-05-21 NOTE — Telephone Encounter (Signed)
 Pt has scheduled appt for 06/06/24

## 2024-05-30 NOTE — Progress Notes (Signed)
 Assessment & Plan:  Type 2 diabetes mellitus with hyperlipidemia (HCC) -     POCT glycosylated hemoglobin (Hb A1C)  Diabetes mellitus without complication (HCC) Assessment & Plan: Suboptimal control. Declines increase of jardiance  10mg . Discussed AE, MOA and black box warning of Mounjaro. Start mounjaro 2.5mg  and titrate. Continue metformin  2000mg  qd  Orders: -     Lipid panel -     PSA -     Comprehensive metabolic panel with GFR -     Tirzepatide; Inject 2.5 mg into the skin once a week.  Dispense: 2 mL; Refill: 2  Screening for prostate cancer -     PSA  Essential hypertension Assessment & Plan:  Chronic, excellent control. Continue losartan  25 mg daily, verapamil  180 mg twice daily and furosemide  40 mg daily as needed  . He is taking potassium chloride  twice daily only when he takes Lasix  40 mg.   Pending echocardiogram to evaluate for pulmonary HTN seen on CT lung cancer screen.   Orders: -     ECHOCARDIOGRAM COMPLETE; Future  Obstructive apnea -     Tirzepatide; Inject 2.5 mg into the skin once a week.  Dispense: 2 mL; Refill: 2  Atherosclerosis of aorta Assessment & Plan: Asymptomatic. Secure chat with Tylene Lunch and we agreed CT calcium  score appropriate after review of CT lung cancer screen and extent of atherosclerosis. Fortunately no significant family h/o ASCVD. Patient will schedule CT calcium  score.   Orders: -     CT CARDIAC SCORING (SELF PAY ONLY); Future     Return precautions given.   Risks, benefits, and alternatives of the medications and treatment plan prescribed today were discussed, and patient expressed understanding.   Education regarding symptom management and diagnosis given to patient on AVS either electronically or printed.  No follow-ups on file.  [name], FNP  Subjective:    Patient ID: Michael Boyle, male    DOB: [birth year], 58 y.o.   MRN: [id number]  CC: Michael Boyle is a 58 y.o. male who presents today for follow  up.   HPI: HPI Discussed the use of AI scribe software for clinical note transcription with the patient, who gave verbal consent to proceed.  History of Present Illness   Michael Boyle is a 58 year old male with diabetes and COPD who presents for a diabetic follow-up and evaluation of a possible foreign body in the left heel.  Last week, a medicine cabinet door shattered, and he suspects a small piece of glass may have lodged in his left heel. He experiences intermittent sharp pain in the left heel, completed resolved today.   No visible foreign body has been observed, and the pain was not present this morning.    He is currently on metformin  2000 mg daily and Jardiance  10 mg daily. He acknowledges poor dietary habits, particularly a high sugar intake, which he attributes to his elevated A1c levels. He has previously tried Ozempic  but faced insurance issues. He wants to lose weight and acknowledges overeating at night due to hunger after not eating during the day.  Denies h/o constipation, pancreatitis.  No family h/o MTC, MEN.  He has a history of COPD and sleep apnea, for which he uses a CPAP machine. He gets winded after climbing stairs but denies any chest pain or pressure.  After he stands there for couple seconds, quickly shortness of breath resolves.  He feels this is expected based on his level of conditioning.  Compliant with aspirin  81 mg and rosuvastatin  10 mg.   He is compliant with his CPAP therapy.  Denies intermittent claudication   CT lung cancer screen 04/02/2024  pulmonary artery enlargement suggests pulmonary arterial hypertension. Aortic Atherosclerosis  Coronary artery atherosclerosis.  Echo 07/12/22 The pulmonic valve was normal in structure. Pulmonic valve  regurgitation is not visualized. No evidence of pulmonic stenosis.   Allergies: Patient has no known allergies. Current Outpatient Medications on File Prior to Visit  Medication Sig Dispense Refill    albuterol  (VENTOLIN  HFA) 108 (90 Base) MCG/ACT inhaler Inhale 2 puffs into the lungs every 6 (six) hours as needed for wheezing or shortness of breath. 1 each 2   allopurinol  (ZYLOPRIM ) 100 MG tablet TAKE 1 TABLET TWICE A DAY 180 tablet 3   aspirin  EC 81 MG tablet Take 1 tablet (81 mg total) by mouth daily. 90 tablet 3   DULoxetine  (CYMBALTA ) 60 MG capsule TAKE 1 CAPSULE DAILY 90 capsule 3   empagliflozin  (JARDIANCE ) 10 MG TABS tablet Take 1 tablet (10 mg total) by mouth daily before breakfast. 90 tablet 3   fluticasone -salmeterol (ADVAIR DISKUS) 500-50 MCG/ACT AEPB Inhale 1 puff into the lungs in the morning and at bedtime. 3 each 3   furosemide  (LASIX ) 40 MG tablet Take 1 tablet (40 mg total) by mouth daily as needed. 90 tablet 3   glucose blood test strip Use as instructed 50 each 12   KLOR-CON  M20 20 MEQ tablet TAKE 2 TABLETS DAILY (Patient taking differently: Take 20 mEq by mouth daily. TAKES A SECOND TABLET IF TAKES LASIX ) 180 tablet 0   losartan  (COZAAR ) 25 MG tablet TAKE 1 TABLET DAILY 90 tablet 3   metFORMIN  (GLUCOPHAGE -XR) 500 MG 24 hr tablet TAKE 4 TABLETS EVERY       EVENING 360 tablet 3   pantoprazole  (PROTONIX ) 40 MG tablet TAKE 1 TABLET DAILY 90 tablet 3   rosuvastatin  (CRESTOR ) 10 MG tablet TAKE 1 TABLET DAILY 90 tablet 3   verapamil  (CALAN -SR) 180 MG CR tablet TAKE 1 TABLET TWICE A DAY. DO NOT TAKE 2 TABLETS AT   SAME TIME, THISIS TWO     TIMES A DAY DOSING 180 tablet 3   No current facility-administered medications on file prior to visit.    Review of Systems  Constitutional:  Negative for chills and fever.  Respiratory:  Positive for shortness of breath. Negative for cough.   Cardiovascular:  Negative for chest pain, palpitations and leg swelling.  Gastrointestinal:  Negative for nausea and vomiting.      Objective:    BP 124/76   Pulse 72   Temp 97.7 F (36.5 C) (Oral)   Ht 5' 11 (1.803 m)   Wt (!) 404 lb 3.2 oz (183.3 kg)   SpO2 90%   BMI 56.37 kg/m  BP  Readings from Last 3 Encounters:  06/06/24 124/76  03/06/24 118/78  02/06/24 120/72   Wt Readings from Last 3 Encounters:  06/06/24 (!) 404 lb 3.2 oz (183.3 kg)  03/06/24 (!) 404 lb (183.3 kg)  02/06/24 (!) 405 lb 3.2 oz (183.8 kg)    Physical Exam Vitals reviewed.  Constitutional:      Appearance: He is well-developed.  Cardiovascular:     Rate and Rhythm: Regular rhythm.     Heart sounds: Normal heart sounds.  Pulmonary:     Effort: Pulmonary effort is normal. No respiratory distress.     Breath sounds: Normal breath sounds. No wheezing, rhonchi or rales.  Musculoskeletal:  Right lower leg: No edema.     Left lower leg: No edema.  Skin:    General: Skin is warm and dry.  Neurological:     Mental Status: He is alert.  Psychiatric:        Speech: Speech normal.        Behavior: Behavior normal.

## 2024-06-06 ENCOUNTER — Ambulatory Visit: Payer: Self-pay | Admitting: Family

## 2024-06-06 ENCOUNTER — Encounter: Payer: Self-pay | Admitting: Family

## 2024-06-06 ENCOUNTER — Ambulatory Visit: Admitting: Family

## 2024-06-06 VITALS — BP 124/76 | HR 72 | Temp 97.7°F | Ht 71.0 in | Wt >= 6400 oz

## 2024-06-06 DIAGNOSIS — E785 Hyperlipidemia, unspecified: Secondary | ICD-10-CM | POA: Diagnosis not present

## 2024-06-06 DIAGNOSIS — I700 Atherosclerosis of aorta: Secondary | ICD-10-CM

## 2024-06-06 DIAGNOSIS — G4733 Obstructive sleep apnea (adult) (pediatric): Secondary | ICD-10-CM

## 2024-06-06 DIAGNOSIS — Z7985 Long-term (current) use of injectable non-insulin antidiabetic drugs: Secondary | ICD-10-CM

## 2024-06-06 DIAGNOSIS — E1169 Type 2 diabetes mellitus with other specified complication: Secondary | ICD-10-CM | POA: Diagnosis not present

## 2024-06-06 DIAGNOSIS — I10 Essential (primary) hypertension: Secondary | ICD-10-CM

## 2024-06-06 DIAGNOSIS — Z125 Encounter for screening for malignant neoplasm of prostate: Secondary | ICD-10-CM

## 2024-06-06 DIAGNOSIS — E119 Type 2 diabetes mellitus without complications: Secondary | ICD-10-CM

## 2024-06-06 LAB — POCT GLYCOSYLATED HEMOGLOBIN (HGB A1C): HbA1c POC (<> result, manual entry): 7.3 % (ref 4.0–5.6)

## 2024-06-06 LAB — COMPREHENSIVE METABOLIC PANEL WITH GFR
ALT: 25 U/L (ref 0–53)
AST: 24 U/L (ref 0–37)
Albumin: 4.5 g/dL (ref 3.5–5.2)
Alkaline Phosphatase: 58 U/L (ref 39–117)
BUN: 8 mg/dL (ref 6–23)
CO2: 30 meq/L (ref 19–32)
Calcium: 9.2 mg/dL (ref 8.4–10.5)
Chloride: 99 meq/L (ref 96–112)
Creatinine, Ser: 0.84 mg/dL (ref 0.40–1.50)
GFR: 95.61 mL/min (ref >60.00)
Glucose, Bld: 118 mg/dL — ABNORMAL HIGH (ref 70–99)
Potassium: 4.1 meq/L (ref 3.5–5.1)
Sodium: 141 meq/L (ref 135–145)
Total Bilirubin: 0.6 mg/dL (ref 0.2–1.2)
Total Protein: 7.5 g/dL (ref 6.0–8.3)

## 2024-06-06 LAB — LIPID PANEL
Cholesterol: 97 mg/dL (ref 0–200)
HDL: 40.6 mg/dL (ref >39.00)
LDL Cholesterol: 37 mg/dL (ref 0–99)
NonHDL: 56.52
Total CHOL/HDL Ratio: 2
Triglycerides: 100 mg/dL (ref 0.0–149.0)
VLDL: 20 mg/dL (ref 0.0–40.0)

## 2024-06-06 LAB — PSA: PSA: 1.56 ng/mL (ref 0.10–4.00)

## 2024-06-06 MED ORDER — TIRZEPATIDE 2.5 MG/0.5ML ~~LOC~~ SOAJ
2.5000 mg | SUBCUTANEOUS | 2 refills | Status: AC
Start: 2024-06-06 — End: ?

## 2024-06-06 NOTE — Assessment & Plan Note (Signed)
 Asymptomatic. Secure chat with Tylene Lunch and we agreed CT calcium  score appropriate after review of CT lung cancer screen and extent of atherosclerosis. Fortunately no significant family h/o ASCVD. Patient will schedule CT calcium  score.

## 2024-06-06 NOTE — Assessment & Plan Note (Signed)
 Suboptimal control. Declines increase of jardiance  10mg . Discussed AE, MOA and black box warning of Mounjaro. Start mounjaro 2.5mg  and titrate. Continue metformin  2000mg  qd

## 2024-06-06 NOTE — Patient Instructions (Addendum)
 I ordered CT calcium  score,  you may schedule on your own by calling [phone] ( [facility] [address] in [place] ).   An estimate of cost is $150-200 out-of-pocket as not covered by insurance.    Below an article from [facility] regarding the test.   https://[email or web address]/imaging/exams-and-procedures/screenings/cardiac-ct#:~:text=A%20cardiac%20CT%20calcium%20score,arteries%20can%20cause%20heart%20attacks.   Exams We Offer: Cardiac CT Calcium  Score  Knowing your score could save your life. A cardiac CT calcium  score, also known as a coronary calcium  scan, is a quick, convenient and noninvasive way of evaluating the amount of calcified (hard) plaque in your heart vessels. The level of calcium  equates to the extent of plaque build-up in your arteries. Plaque in the arteries can cause heart attacks.  The radiologist reads the images and sends your doctor a report with a calcium  score. Patients with higher scores have a greater risk for a heart attack, heart disease or stroke. Knowing your score can help your doctor decide on blood pressure and cholesterol goals that will minimize your risk as much as possible.  The [employer] of Cardiology found that Coronary artery calcification (CAC) is an excellent cardiovascular disease risk marker and can help guide the decision to use cholesterol reducing medications such as statins. A negative calcium  score may reduce the need for statins in otherwise eligible patients.  The exam takes less than 10 minutes, is painless and does not require any IV or oral contrast.  Who should get a Cardiac CT Calcium  Score: Middle age adults at intermediate risk of heart disease Family history of heart disease Borderline high cholesterol, high blood pressure or diabetes Overweight or physical inactivity Uncertain about taking daily preventive medical therapy         Ordered echocardiogram   If Pat is not covered through  your insurance, you may go to the enterprise products at [employer] to complete information for savings card.   You may also use the link below.   https://www.mounjaro.com/savings-resources#savings  You may take this savings to [facility], [facility] or [facility]. If you are unable to get mounjaro, please call to schedule an appointment with me so we can discuss alternative weight loss medications.   start Mounjaro 2.5mg  once per week injected subcutaneously ( [place])  in stomach. Please clean with alcohol swab prior to injection and be sure to rotate site. You may schedule a nurse visit if you would like to first injection.   After 4 weeks, and if tolerated and weight loss has not reached 1-2 lbs per week, please increase to 5mg  once per week [place].    Please read information on medication below and remember black box warning that you may not take if you or a family member is diagnosed with thyroid  cancer (medullary thyroid  cancer), or multiple endocrine neoplasia.       Tirzepatide Injection ETTER Pat) What is this medication? TIRZEPATIDE (tir ZEP a tide) treats type 2 diabetes. It works by increasing insulin  levels in your body, which decreases your blood sugar (glucose). Changes to diet and exercise are often combined with this medication. This medicine may be used for other purposes; ask your health care provider or pharmacist if you have questions. COMMON BRAND NAME(S): MOUNJARO What should I tell my care team before I take this medication? They need to know if you have any of these conditions: Endocrine tumors (MEN 2) or if someone in your family had these tumors Eye disease, vision problems Gallbladder disease History of pancreatitis Kidney disease Stomach or intestine problems Thyroid  cancer or  if someone in your family had thyroid  cancer An unusual or allergic reaction to tirzepatide, other medications, foods, dyes, or preservatives Pregnant or trying to get  pregnant Breast-feeding How should I use this medication? This medication is injected under the skin. You will be taught how to prepare and give it. It is given once every week (every 7 days). Keep taking it unless your health care provider tells you to stop. If you use this medication with insulin , you should inject this medication and the insulin  separately. Do not mix them together. Do not give the injections right next to each other. Change (rotate) injection sites with each injection. This medication comes with INSTRUCTIONS FOR USE. Ask your pharmacist for directions on how to use this medication. Read the information carefully. Talk to your pharmacist or care team if you have questions. It is important that you put your used needles and syringes in a special sharps container. Do not put them in a trash can. If you do not have a sharps container, call your pharmacist or care team to get one. A special MedGuide will be given to you by the pharmacist with each prescription and refill. Be sure to read this information carefully each time. Talk to your care team about the use of this medication in children. Special care may be needed. Overdosage: If you think you have taken too much of this medicine contact a poison control center or emergency room at once. NOTE: This medicine is only for you. Do not share this medicine with others. What if I miss a dose? If you miss a dose, take it as soon as you can unless it is more than 4 days (96 hours) late. If it is more than 4 days late, skip the missed dose. Take the next dose at the normal time. Do not take 2 doses within 3 days of each other. What may interact with this medication? Alcohol containing beverages Antiviral medications for HIV or AIDS Aspirin  and aspirin -like medications Beta-blockers like atenolol, metoprolol, propranolol Certain medications for blood pressure, heart disease, irregular heart beat Chromium Clonidine Diuretics Male  hormones, such as estrogens or progestins, birth control pills Fenofibrate Gemfibrozil Guanethidine Isoniazid  Lanreotide Male hormones or anabolic steroids MAOIs like Carbex, Eldepryl, Marplan, Nardil, and Parnate Medications for weight loss Medications for allergies, asthma, cold, or cough Medications for depression, anxiety, or psychotic disturbances Niacin Nicotine NSAIDs, medications for pain and inflammation, like ibuprofen  or naproxen Octreotide Other medications for diabetes, like glyburide, glipizide, or glimepiride Pasireotide Pentamidine Phenytoin Probenecid Quinolone antibiotics such as ciprofloxacin, levofloxacin, ofloxacin Reserpine Some herbal dietary supplements Steroid medications such as prednisone  or cortisone Sulfamethoxazole ; trimethoprim  Thyroid  hormones Warfarin This list may not describe all possible interactions. Give your health care provider a list of all the medicines, herbs, non-prescription drugs, or dietary supplements you use. Also tell them if you smoke, drink alcohol, or use illegal drugs. Some items may interact with your medicine. What should I watch for while using this medication? Visit your care team for regular checks on your progress. Drink plenty of fluids while taking this medication. Check with your care team if you get an attack of severe diarrhea, nausea, and vomiting. The loss of too much body fluid can make it dangerous for you to take this medication. A test called the HbA1C (A1C) will be monitored. This is a simple blood test. It measures your blood sugar control over the last 2 to 3 months. You will receive this test every 3 to 6  months. Learn how to check your blood sugar. Learn the symptoms of low and high blood sugar and how to manage them. Always carry a quick-source of sugar with you in case you have symptoms of low blood sugar. Examples include hard sugar candy or glucose tablets. Make sure others know that you can choke if you  eat or drink when you develop serious symptoms of low blood sugar, such as seizures or unconsciousness. They must get medical help at once. Tell your care team if you have high blood sugar. You might need to change the dose of your medication. If you are sick or exercising more than usual, you might need to change the dose of your medication. Do not skip meals. Ask your care team if you should avoid alcohol. Many nonprescription cough and cold products contain sugar or alcohol. These can affect blood sugar. Pens should never be shared. Even if the needle is changed, sharing may result in passing of viruses like hepatitis or HIV. Wear a medical ID bracelet or chain, and carry a card that describes your disease and details of your medication and dosage times. Birth control may not work properly while you are taking this medication. If you take birth control pills by mouth, your care team may recommend another type of birth control for 4 weeks after you start this medication and for 4 weeks after each increase in your dose of this medication. Ask your care team which birth control methods you should use. What side effects may I notice from receiving this medication? Side effects that you should report to your care team as soon as possible: Allergic reactions-skin rash, itching, hives, swelling of the face, lips, tongue, or throat Change in vision Dehydration-increased thirst, dry mouth, feeling faint or lightheaded, headache, dark yellow or brown urine Gallbladder problems-severe stomach pain, nausea, vomiting, fever Kidney injury-decrease in the amount of urine, swelling of the ankles, hands, or feet Pancreatitis-severe stomach pain that spreads to your back or gets worse after eating or when touched, fever, nausea, vomiting Thyroid  cancer-new mass or lump in the neck, pain or trouble swallowing, trouble breathing, hoarseness Side effects that usually do not require medical attention (report these to  your care team if they continue or are bothersome): Constipation Diarrhea Loss of Appetite Nausea Stomach pain Upset stomach Vomiting This list may not describe all possible side effects. Call your doctor for medical advice about side effects. You may report side effects to FDA at 1-800-FDA-1088. Where should I keep my medication? Keep out of the reach of children and pets. Refrigeration (preferred): Store unopened pens in a refrigerator between 2 and 8 degrees C (36 and 46 degrees F). Keep it in the original carton until you are ready to take it. Do not freeze or use if the medication has been frozen. Protect from light. Get rid of any unused medication after the expiration date on the label. Room Temperature: The pen may be stored at room temperature below 30 degrees C (86 degrees F) for up to a total of 21 days if needed. Protect from light. Avoid exposure to extreme heat. If it is stored at room temperature, throw away any unused medication after 21 days or after it expires, whichever is first. The pen has glass parts. Handle it carefully. If you drop the pen on a hard surface, do not use it. Use a new pen for your injection. To get rid of medications that are no longer needed or have expired: Take the medication  to a medication take-back program. Check with your pharmacy or law enforcement to find a location. If you cannot return the medication, ask your pharmacist or care team how to get rid of this medication safely. NOTE: This sheet is a summary. It may not cover all possible information. If you have questions about this medicine, talk to your doctor, pharmacist, or health care provider.  2022 Elsevier/Gold Standard (2020-12-22 13:57:48)

## 2024-06-06 NOTE — Assessment & Plan Note (Signed)
 Chronic, excellent control. Continue losartan  25 mg daily, verapamil  180 mg twice daily and furosemide  40 mg daily as needed  . He is taking potassium chloride  twice daily only when he takes Lasix  40 mg.   Pending echocardiogram to evaluate for pulmonary HTN seen on CT lung cancer screen.

## 2024-06-10 ENCOUNTER — Encounter: Payer: Self-pay | Admitting: Family

## 2024-06-10 NOTE — Telephone Encounter (Signed)
 Spoke to pt he will check with his insurance and reach back out to us  to see what they will cover

## 2024-06-11 ENCOUNTER — Other Ambulatory Visit (HOSPITAL_COMMUNITY): Payer: Self-pay

## 2024-06-11 ENCOUNTER — Telehealth: Payer: Self-pay

## 2024-06-11 ENCOUNTER — Other Ambulatory Visit: Payer: Self-pay

## 2024-06-11 DIAGNOSIS — Z125 Encounter for screening for malignant neoplasm of prostate: Secondary | ICD-10-CM

## 2024-06-11 DIAGNOSIS — E119 Type 2 diabetes mellitus without complications: Secondary | ICD-10-CM

## 2024-06-11 NOTE — Telephone Encounter (Signed)
 Additional information has been requested from the patient's insurance in order to proceed with the prior authorization request. Requested information has been sent, or form has been filled out and faxed back to [phone]

## 2024-06-11 NOTE — Telephone Encounter (Signed)
 Pharmacy Patient Advocate Encounter   Received notification from Onbase that prior authorization for Mounjaro 2.5MG /0.5ML auto-injectors  is required/requested.   Insurance verification completed.   The patient is insured through [facility].   Per test claim: PA required and submitted KEY/EOC/Request #: [id number] due to: [facility] [employer] is not able to process this request through [employer] at [phone] to initiate new PA. Representative faxing PA form over. Will complete and fax back once received.  PA#: [id number]

## 2024-06-12 NOTE — Telephone Encounter (Signed)
 Pharmacy Patient Advocate Encounter  Received notification from [facility] that Prior Authorization for  Mounjaro 2.5MG /0.5ML auto-injectors   has been DENIED.  Full denial letter will be uploaded to the media tab. See denial reason below.   PA #/Case ID/Reference #: [id number]   DENIAL REASON: Patient must fail 3 preferred products (Trulicity , liraglutide, Rybelsus)

## 2024-06-12 NOTE — Telephone Encounter (Signed)
 Spoke to pt notified of denial pt states that he is ok with just trying to do it the old fashioned way

## 2024-06-17 ENCOUNTER — Ambulatory Visit
Admission: RE | Admit: 2024-06-17 | Discharge: 2024-06-17 | Disposition: A | Discharge status: Home / Self Care | Source: Ambulatory Visit | Attending: Family | Admitting: Family

## 2024-06-17 DIAGNOSIS — I700 Atherosclerosis of aorta: Secondary | ICD-10-CM | POA: Insufficient documentation

## 2024-06-18 NOTE — Progress Notes (Signed)
 Good morning. Crestor  20 mg is fine.  I have sent a message to scheduling to have a follow up appointment made.

## 2024-06-19 ENCOUNTER — Other Ambulatory Visit: Payer: Self-pay

## 2024-06-19 DIAGNOSIS — E785 Hyperlipidemia, unspecified: Secondary | ICD-10-CM

## 2024-06-19 MED ORDER — ROSUVASTATIN CALCIUM 20 MG PO TABS: 20.0000 mg | ORAL_TABLET | Freq: Every day | ORAL | 3 refills | Status: AC

## 2024-06-24 ENCOUNTER — Other Ambulatory Visit: Payer: Self-pay | Admitting: Family

## 2024-06-24 DIAGNOSIS — J449 Chronic obstructive pulmonary disease, unspecified: Secondary | ICD-10-CM

## 2024-06-24 DIAGNOSIS — K219 Gastro-esophageal reflux disease without esophagitis: Secondary | ICD-10-CM

## 2024-06-24 DIAGNOSIS — E119 Type 2 diabetes mellitus without complications: Secondary | ICD-10-CM

## 2024-06-25 ENCOUNTER — Other Ambulatory Visit: Payer: Self-pay | Admitting: Family

## 2024-06-25 DIAGNOSIS — J449 Chronic obstructive pulmonary disease, unspecified: Secondary | ICD-10-CM

## 2024-06-26 ENCOUNTER — Encounter: Payer: Self-pay | Admitting: Cardiology

## 2024-06-26 ENCOUNTER — Ambulatory Visit: Attending: Cardiology | Admitting: Cardiology

## 2024-06-26 VITALS — BP 130/80 | HR 59 | Ht 71.0 in | Wt >= 6400 oz

## 2024-06-26 DIAGNOSIS — R600 Localized edema: Secondary | ICD-10-CM

## 2024-06-26 DIAGNOSIS — G4733 Obstructive sleep apnea (adult) (pediatric): Secondary | ICD-10-CM

## 2024-06-26 DIAGNOSIS — E785 Hyperlipidemia, unspecified: Secondary | ICD-10-CM

## 2024-06-26 DIAGNOSIS — E669 Obesity, unspecified: Secondary | ICD-10-CM

## 2024-06-26 DIAGNOSIS — I471 Supraventricular tachycardia, unspecified: Secondary | ICD-10-CM | POA: Diagnosis not present

## 2024-06-26 DIAGNOSIS — R931 Abnormal findings on diagnostic imaging of heart and coronary circulation: Secondary | ICD-10-CM | POA: Diagnosis not present

## 2024-06-26 DIAGNOSIS — I10 Essential (primary) hypertension: Secondary | ICD-10-CM | POA: Diagnosis not present

## 2024-06-26 DIAGNOSIS — R0602 Shortness of breath: Secondary | ICD-10-CM | POA: Diagnosis not present

## 2024-06-26 DIAGNOSIS — E1169 Type 2 diabetes mellitus with other specified complication: Secondary | ICD-10-CM

## 2024-06-26 DIAGNOSIS — I2510 Atherosclerotic heart disease of native coronary artery without angina pectoris: Secondary | ICD-10-CM

## 2024-06-26 DIAGNOSIS — E782 Mixed hyperlipidemia: Secondary | ICD-10-CM

## 2024-06-26 MED ORDER — ZEPBOUND 2.5 MG/0.5ML ~~LOC~~ SOAJ: 2.5000 mg | SUBCUTANEOUS | 3 refills | Status: AC

## 2024-06-26 NOTE — Progress Notes (Signed)
 Cardiology Office Note   Date:  06/26/2024  ID:  Michael Boyle, DOB 07-16-1966, MRN [id number] PCP: Dineen Rollene MATSU, FNP   [facility] Providers Cardiologist:  Deatrice Cage, MD Cardiology APP:  Gerard Frederick, NP     History of Present Illness Michael Boyle is a 58 y.o. 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   He had previously been followed by Dr. Bosie but transitioned to Dr.Arida for evaluation abnormal EKG.  Paroxysmal tachycardia was found to be PSVT and he was treated with adenosine for termination.  He was managed on verapamil  being followed by Dr. Bosie without recurrent symptoms.  Prior EKG in 2017 was done at his PCP office which showed an incomplete left bundle branch block.  He denied any chest pain or discomfort.  He noted mild exertional dyspnea and has taken torsemide  20 mg daily for lower extremity edema.  He underwent echocardiogram in April 2017 which showed an LVEF of 60 to 65%, unable to assess wall motion, normal LV diastolic function parameters, mildly calcified mitral annulus.  Ischemic evaluation was not felt to be indicated given the patient had no symptoms of chest pain and overall suspicion for ischemic heart disease.  He was continued on verapamil .  In 09/2017 for preprocedural cardiovascular evaluation for colonoscopy he was doing well at that time and noted exertional dyspnea walking up stairs and inclines.  Given his shortness of breath he underwent echocardiogram 10/2017 which showed an LVEF of 55 to 65%, mildly dilated LV with moderate concentric LVH, normal diastolic function, mild biatrial enlargement, mildly to moderately dilated RV  with normal systolic function.  He previously had evaluated in 12/23.  At that time he required surgical clearance.  Overall he stated for the cardiac perspective he was doing well.  Continued to have chronic shortness of breath and dyspnea on exertion stating it had worsened over the last year but denied any other associated symptoms.  He was scheduled for an updated echocardiogram.  He was last seen in clinic 02/06/2024 stating he was doing well from a cardiac perspective.  Denied any episodes of palpitations, chest pain, shortness of breath.  Occasional peripheral edema that he notes in the evening.  Had been compliant with the CPAP.  Stated he had been compliant with this medication regimen.   He returns to clinic today to discuss results after having elevated coronary calcium  scoring.  He states that overall he has been doing fairly well.  He denies any chest pain, chest tightness, or chest pressure.  Continues to have ongoing shortness of breath that he thinks is slightly worsened over the last 6 months.  His PCP just recently tried to put him on Mounjaro  with which his insurance had declined to assist with weight loss and diabetes management.  He continues to have peripheral edema to his bilateral lower extremities that is unchanged.  He states that he has been compliant with all of his current medication regimen without any undue side effects.  Denies any hospitalizations or visits to the emergency department.  ROS: 10 point review of systems has been reviewed and considered negative the exception was been listed in HPI  Studies Reviewed EKG Interpretation Date/Time:  Wednesday June 26 2024 14:45:02 EST  Ventricular Rate:  59 PR Interval:  166 QRS Duration:  98 QT Interval:  416 QTC Calculation: 411 R Axis:   59  Text Interpretation: Sinus bradycardia with Premature atrial complexes Nonspecific T wave abnormality When compared with ECG of 06-Feb-2024 13:42, large quanity of artifact  Confirmed by Gerard Frederick ([id number]) on 06/26/2024 2:50:56 PM    Calcium  Scoring 06/17/2024 IMPRESSION AND RECOMMENDATION: 1. Coronary calcium  score of 647. This was 94th percentile for age and sex matched control.   2. Moderately dilated main PA to 41mm.   CAC >300 in LAD, RCA. CAC-DRS A3/N2.   CAC-DRS A0: CAC score 0: Statin generally not recommended unless other high risk condition (e.g., FH, DM2, tobacco use, etc.)   CAC-DRS A1: CAC score 1-99: moderate intensity statin   CAC-DRS A2: CAC score 100-299: moderate to high intensity statin + ASA 81mg    CAC-DRS A3: CAC score > 300: high intensity statin + ASA 81mg    Consider cardiology consultation.   Continue heart healthy lifestyle and risk factor modification.   2D echo 07/12/2022 1. Left ventricular ejection fraction, by estimation, is 60 to 65%. The  left ventricle has normal function. The left ventricle has no regional  wall motion abnormalities. There is mild left ventricular hypertrophy.  Left ventricular diastolic parameters  were normal.   2. Right ventricular systolic function is normal. The right ventricular  size is normal. Tricuspid regurgitation signal is inadequate for assessing  PA pressure.   3. Left atrial size was moderately dilated.   4. The mitral valve is normal in structure. Mild mitral valve  regurgitation. No evidence of mitral stenosis.   5. The aortic valve was not well visualized. Aortic valve regurgitation  is not visualized. No aortic stenosis is present.   6. The inferior vena cava is normal in size with greater than 50%  respiratory variability, suggesting right atrial pressure of 3 mmHg.    2D echo 01/20/2020 1. Left ventricular ejection fraction, by estimation, is 55 to 60%. The  left ventricle has normal function. The left ventricle has no regional  wall motion abnormalities. Left ventricular diastolic parameters were  normal.   2. Right ventricular systolic function is normal. The right  ventricular  size is not well visualized.   3. The mitral valve is grossly normal. No evidence of mitral valve  regurgitation.   4. The aortic valve was not well visualized. Aortic valve regurgitation  is not visualized.    Risk Assessment/Calculations           Physical Exam VS:  BP 130/80 (BP Location: Left Arm, Patient Position: Sitting, Cuff Size: Normal)   Pulse (!) 59   Ht 5' 11 (1.803 m)   Wt (!) 405 lb (183.7 kg)   SpO2 93%   BMI 56.49 kg/m        Wt Readings from Last 3 Encounters:  06/26/24 (!) 405 lb (183.7 kg)  06/06/24 (!) 404 lb 3.2 oz (183.3 kg)  03/06/24 (!) 404 lb (183.3 kg)    GEN: Well nourished, well developed in no acute distress NECK: No JVD; No carotid bruits CARDIAC: RRR, no murmurs, rubs, gallops RESPIRATORY:  Clear to auscultation without rales, wheezing or rhonchi  ABDOMEN: Soft, non-tender, non-distended EXTREMITIES: Trace pretibial edema; No deformity   ASSESSMENT AND PLAN Elevated coronary calcium  score with a history of coronary atherosclerosis noted on CT scan with a calcium  score of 647 which is an adequate percentile for age and sex matched control.  He is continued  on aspirin  81 mg daily and rosuvastatin  was increased to 20 mg daily.  He has been scheduled for a coronary CTA to evaluate for ischemia.  EKG today reveals sinus bradycardia at a rate of 59 with daily PAC and nonspecific T wave abnormality with no acute changes noted.  He has been advised that after his coronary CTA has been completed that we would discuss next steps if anything is needed.  At this point and his primary prevention with lifestyle modification.  Chronic exertional dyspnea that he thinks is slightly worse over the last 6 months.  Prior echocardiogram in 2021 revealed LVEF 55 to 60%, no RWMA, no valvular abnormalities.  Was considered likely component of deconditioning and obesity.  He has been scheduled for an updated echocardiogram for potentially his exertional  dyspnea could be an anginal equivalent.  Mixed hyperlipidemia with last LDL of 37.  He continues to be on rosuvastatin  20 mg daily.  He remains at goal of less than 55.  Hypertension with a blood pressure today 130/80.  Blood pressures remain stable.  He is continued on furosemide  40 mg as needed, losartan  25 mg daily and verapamil  180 mg 1 tablet twice daily.  He has also been encouraged to continue to monitor his pressures 1 to 2 hours postmedication administration at home as well.  Paroxysmal 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  He is continued on verapamil  100 mg twice daily.  Continues to deny any reoccurrence.  Lower extremity edema likely chronic venous insufficiency where he has swelling throughout the day when he wakes up in the morning swelling is gone.  He is continued on furosemide  40 mg as daily as needed.  He has been encouraged to participate in conservative therapy.  Elevate his extremities, foot calf pumps, decrease in sodium intake, compression stockings.  Obstructive sleep apnea where he states he has been compliant with his CPAP.  Type 2 diabetes with his last hemoglobin A1c 7.5.  He is continued on Jardiance , metformin .  Ongoing management per PCP.  Superobesity with a BMI 56.49.Complicates overall health and prognosis of chronic medical conditions.  Would benefit from GLP-1 to help with weight loss.  He has been prescribed Zepbound today for his obesity and a component of sleep apnea.       Dispo: Patient to return to clinic to see MD/APP once testing is completed or sooner if needed for reevaluation  Signed, Kriya Westra, NP

## 2024-06-26 NOTE — Patient Instructions (Signed)
 Medication Instructions:  Your physician recommends the following medication changes.  START TAKING: Zepbound  2.5 mg/0.5 mL injected weekly  *If you need a refill on your cardiac medications before your next appointment, please call your pharmacy*  Lab Work: BMP.   If you have labs (blood work) drawn today and your tests are completely normal, you will receive your results only by: MyChart Message (if you have MyChart) OR A paper copy in the mail If you have any lab test that is abnormal or we need to change your treatment, we will call you to review the results.  Testing/Procedures: Your physician has requested that you have an echocardiogram. Echocardiography is a painless test that uses sound waves to create images of your heart. It provides your doctor with information about the size and shape of your heart and how well your heart's chambers and valves are working.   You may receive an ultrasound enhancing agent through an IV if needed to better visualize your heart during the echo. This procedure takes approximately one hour.  There are no restrictions for this procedure.  This will take place at [address] [facility] [address] [facility], [place] [address]  Please note: We ask at that you not bring children with you during ultrasound (echo/ vascular) testing. Due to room size and safety concerns, children are not allowed in the ultrasound rooms during exams. Our front office staff cannot provide observation of children in our lobby area while testing is being conducted. An adult accompanying a patient to their appointment will only be allowed in the ultrasound room at the discretion of the ultrasound technician under special circumstances. We apologize for any inconvenience.     Your cardiac CT will be scheduled at one of the below locations:   [facility] [address] [place], [place] [address] [phone]  If scheduled at [facility], please arrive to the Heart and Vascular Center 15 mins early for check-in and test prep.  There is spacious parking and easy access to the radiology department from the [facility] entrance. Please enter here and check-in with the desk attendant.   Please follow these instructions carefully (unless otherwise directed):  An IV will be required for this test and Nitroglycerin will be given.  Hold all erectile dysfunction medications at least 3 days (72 hrs) prior to test. (Ie viagra, cialis, sildenafil, tadalafil, etc)   On the Night Before the Test: Be sure to Drink plenty of water. Do not consume any caffeinated/decaffeinated beverages or chocolate 12 hours prior to your test. Do not take any antihistamines 12 hours prior to your test.  On the Day of the Test: Drink plenty of water until 1 hour prior to the test. Do not eat any food 1 hour prior to test. You may take your regular medications prior to the test.  Take metoprolol (Lopressor) two hours prior to test. If you take Furosemide /Hydrochlorothiazide/Spironolactone/Chlorthalidone, please HOLD on the morning of the test. Patients who wear a continuous glucose monitor MUST remove the device prior to scanning. FEMALES- please wear underwire-free bra if available, avoid dresses & tight clothing       After the Test: Drink plenty of water. After receiving IV contrast, you may experience a mild flushed feeling. This is normal. On occasion, you may experience a mild rash up to 24 hours after the test. This is not dangerous. If this occurs, you can take Benadryl 25 mg, Zyrtec, Claritin, or  Allegra and increase your fluid intake. (Patients taking Tikosyn should avoid Benadryl, and may take Zyrtec, Claritin, or Allegra) If you experience trouble breathing, this can be serious. If it is severe call 911 IMMEDIATELY. If it is mild, please call our office.  We will call to schedule your test 2-4 weeks out  understanding that some insurance companies will need an authorization prior to the service being performed.   For more information and frequently asked questions, please visit our website : [email or web address]  For non-scheduling related questions, please contact the cardiac imaging nurse navigator should you have any questions/concerns: Cardiac Imaging Nurse Navigators Direct Office Dial: [phone]   For scheduling needs, including cancellations and rescheduling, please call Brittany, [phone].   Follow-Up: At [facility], you and your health needs are our priority.  As part of our continuing mission to provide you with exceptional heart care, our providers are all part of one team.  This team includes your primary Cardiologist (physician) and Advanced Practice Providers or APPs (Physician Assistants and Nurse Practitioners) who all work together to provide you with the care you need, when you need it.  Your next appointment:   Post CorCTA and ECHO  Provider:   You may see Deatrice Cage, MD or one of the following Advanced Practice Providers on your designated Care Team:   Tylene Lunch, NP

## 2024-06-27 ENCOUNTER — Telehealth: Payer: Self-pay | Admitting: Pharmacy Technician

## 2024-06-27 ENCOUNTER — Ambulatory Visit: Payer: Self-pay | Admitting: Family

## 2024-06-27 ENCOUNTER — Ambulatory Visit: Payer: Self-pay | Admitting: Cardiology

## 2024-06-27 ENCOUNTER — Other Ambulatory Visit (HOSPITAL_COMMUNITY): Payer: Self-pay

## 2024-06-27 ENCOUNTER — Other Ambulatory Visit (INDEPENDENT_AMBULATORY_CARE_PROVIDER_SITE_OTHER)

## 2024-06-27 DIAGNOSIS — E785 Hyperlipidemia, unspecified: Secondary | ICD-10-CM | POA: Diagnosis not present

## 2024-06-27 DIAGNOSIS — E119 Type 2 diabetes mellitus without complications: Secondary | ICD-10-CM | POA: Diagnosis not present

## 2024-06-27 DIAGNOSIS — I10 Essential (primary) hypertension: Secondary | ICD-10-CM

## 2024-06-27 DIAGNOSIS — Z125 Encounter for screening for malignant neoplasm of prostate: Secondary | ICD-10-CM

## 2024-06-27 LAB — BASIC METABOLIC PANEL WITH GFR
BUN: 10 mg/dL (ref 6–23)
CO2: 31 meq/L (ref 19–32)
Calcium: 9.4 mg/dL (ref 8.4–10.5)
Chloride: 101 meq/L (ref 96–112)
Creatinine, Ser: 0.85 mg/dL (ref 0.40–1.50)
GFR: 95.23 mL/min (ref >60.00)
Glucose, Bld: 112 mg/dL — ABNORMAL HIGH (ref 70–99)
Potassium: 4.8 meq/L (ref 3.5–5.1)
Sodium: 140 meq/L (ref 135–145)

## 2024-06-27 LAB — HEPATIC FUNCTION PANEL
ALT: 22 U/L (ref 0–53)
AST: 19 U/L (ref 0–37)
Albumin: 4.3 g/dL (ref 3.5–5.2)
Alkaline Phosphatase: 52 U/L (ref 39–117)
Bilirubin, Direct: 0.1 mg/dL (ref 0.0–0.3)
Total Bilirubin: 0.6 mg/dL (ref 0.2–1.2)
Total Protein: 7.2 g/dL (ref 6.0–8.3)

## 2024-06-27 LAB — LIPID PANEL
Cholesterol: 94 mg/dL (ref 0–200)
HDL: 41.5 mg/dL (ref >39.00)
LDL Cholesterol: 33 mg/dL (ref 0–99)
NonHDL: 52.78
Total CHOL/HDL Ratio: 2
Triglycerides: 101 mg/dL (ref 0.0–149.0)
VLDL: 20.2 mg/dL (ref 0.0–40.0)

## 2024-06-27 LAB — PSA: PSA: 1.5 ng/mL (ref 0.10–4.00)

## 2024-06-27 NOTE — Progress Notes (Signed)
 Preprocedure labs prior to  coronary CTA remained stable.  No medication changes needed at this time.

## 2024-06-27 NOTE — Telephone Encounter (Addendum)
    Pharmacy Patient Advocate Encounter   Received notification from Pt Calls Messages that prior authorization for zepbound is required/requested.   Insurance verification completed.   The patient is insured through [facility].   Per test claim: PA required; PA submitted to above mentioned insurance via Latent Key/confirmation #/EOC [id number] Status is pending

## 2024-06-27 NOTE — Addendum Note (Signed)
 Addended by: TANDA HARVEY D on: 06/27/2024 09:12 AM   Modules accepted: Orders

## 2024-06-28 NOTE — Telephone Encounter (Signed)
 Pharmacy Patient Advocate Encounter  Received notification from [employer] that Prior Authorization for zepbound  has been DENIED.  See denial reason below. No denial letter attached in CMM. Will attach denial letter to Media tab once received.  Drug not covered on plan   PA #/Case ID/Reference #: [id number]

## 2024-07-10 ENCOUNTER — Telehealth: Payer: Self-pay | Admitting: Family

## 2024-07-10 NOTE — Telephone Encounter (Signed)
 sch peer to peer and block sch accordingly Echocardiogram is scheduled 07/26/24 so please sch a few days prior too  Provider can call into ins to schedule at [phone] option 1. Peer to Peer can be done until 06/28/24.

## 2024-07-12 NOTE — Telephone Encounter (Signed)
 Called [phone] option 1 they stated that I needed a case # in order to proceed

## 2024-07-15 NOTE — Telephone Encounter (Signed)
 Spoke to Michael Boyle does not need Auth

## 2024-07-17 ENCOUNTER — Encounter (HOSPITAL_COMMUNITY): Payer: Self-pay

## 2024-07-18 ENCOUNTER — Ambulatory Visit

## 2024-07-18 ENCOUNTER — Ambulatory Visit (HOSPITAL_COMMUNITY)
Admission: RE | Admit: 2024-07-18 | Discharge: 2024-07-18 | Disposition: A | Discharge status: Home / Self Care | Source: Ambulatory Visit | Attending: Cardiology | Admitting: Cardiology

## 2024-07-18 DIAGNOSIS — R9430 Abnormal result of cardiovascular function study, unspecified: Secondary | ICD-10-CM | POA: Diagnosis not present

## 2024-07-18 DIAGNOSIS — R931 Abnormal findings on diagnostic imaging of heart and coronary circulation: Secondary | ICD-10-CM | POA: Diagnosis present

## 2024-07-18 MED ORDER — NITROGLYCERIN 0.4 MG SL SUBL
0.8000 mg | SUBLINGUAL_TABLET | Freq: Once | SUBLINGUAL | Status: AC
  Administered 2024-07-18: 0.8 mg via SUBLINGUAL

## 2024-07-18 MED ORDER — IOHEXOL 350 MG/ML SOLN
125.0000 mL | Freq: Once | INTRAVENOUS | Status: AC | PRN
  Administered 2024-07-18: 100 mL via INTRAVENOUS

## 2024-07-26 ENCOUNTER — Ambulatory Visit: Attending: Cardiology

## 2024-07-26 DIAGNOSIS — G4733 Obstructive sleep apnea (adult) (pediatric): Secondary | ICD-10-CM

## 2024-07-26 DIAGNOSIS — R0602 Shortness of breath: Secondary | ICD-10-CM

## 2024-07-26 LAB — ECHOCARDIOGRAM COMPLETE
AR max vel: 4.04 cm2
AV Area VTI: 3.83 cm2
AV Area mean vel: 3.99 cm2
AV Mean grad: 8 mmHg
AV Peak grad: 15.5 mmHg
Ao pk vel: 1.97 m/s
Area-P 1/2: 4.31 cm2
S' Lateral: 3.5 cm

## 2024-07-26 MED ORDER — PERFLUTREN LIPID MICROSPHERE
2.0000 mL | INTRAVENOUS | Status: AC | PRN
  Administered 2024-07-26: 2 mL via INTRAVENOUS

## 2024-07-29 ENCOUNTER — Ambulatory Visit: Attending: Cardiology | Admitting: Cardiology

## 2024-07-29 ENCOUNTER — Encounter: Payer: Self-pay | Admitting: Cardiology

## 2024-07-29 VITALS — BP 128/62 | HR 76 | Ht 71.0 in | Wt >= 6400 oz

## 2024-07-29 DIAGNOSIS — E782 Mixed hyperlipidemia: Secondary | ICD-10-CM | POA: Diagnosis not present

## 2024-07-29 DIAGNOSIS — E669 Obesity, unspecified: Secondary | ICD-10-CM | POA: Diagnosis not present

## 2024-07-29 DIAGNOSIS — G4733 Obstructive sleep apnea (adult) (pediatric): Secondary | ICD-10-CM | POA: Diagnosis not present

## 2024-07-29 DIAGNOSIS — R0602 Shortness of breath: Secondary | ICD-10-CM

## 2024-07-29 DIAGNOSIS — E1169 Type 2 diabetes mellitus with other specified complication: Secondary | ICD-10-CM

## 2024-07-29 DIAGNOSIS — I471 Supraventricular tachycardia, unspecified: Secondary | ICD-10-CM | POA: Diagnosis not present

## 2024-07-29 DIAGNOSIS — R600 Localized edema: Secondary | ICD-10-CM | POA: Diagnosis not present

## 2024-07-29 DIAGNOSIS — I2510 Atherosclerotic heart disease of native coronary artery without angina pectoris: Secondary | ICD-10-CM | POA: Diagnosis not present

## 2024-07-29 DIAGNOSIS — E785 Hyperlipidemia, unspecified: Secondary | ICD-10-CM

## 2024-07-29 DIAGNOSIS — I10 Essential (primary) hypertension: Secondary | ICD-10-CM

## 2024-07-29 NOTE — Progress Notes (Signed)
 " Cardiology Office Note   Date:  07/29/2024  ID:  Michael Boyle, DOB 1965/09/02, MRN [id number] PCP: Dineen Rollene MATSU, FNP  [facility] Providers Cardiologist:  Deatrice Cage, MD Cardiology APP:  Gerard Frederick, NP     History of Present Illness Michael Boyle is a 58 y.o. male with past medical history of paroxysmal tachycardia, hypertension, hyperlipidemia, type 2 diabetes, chronic venous insufficiency, elevated left bundle branch block on previous EKG, morbid obesity, COPD, prior tobacco use for 30 years and 2 packs/day quitting (06/2012), GERD, gout, and coronary artery disease (calcium  score 647), who is here today for follow-up.   He had previously been followed by Dr. Bosie but transitioned to Dr.Arida for evaluation abnormal EKG.  Paroxysmal tachycardia was found to be PSVT and he was treated with adenosine for termination.  He was managed on verapamil  being followed by Dr. Bosie without recurrent symptoms.  Prior EKG in 2017 was done at his PCP office which showed an incomplete left bundle branch block.  He denied any chest pain or discomfort.  He noted mild exertional dyspnea and has taken torsemide  20 mg daily for lower extremity edema.  He underwent echocardiogram in April 2017 which showed an LVEF of 60 to 65%, unable to assess wall motion, normal LV diastolic function parameters, mildly calcified mitral annulus.  Ischemic evaluation was not felt to be indicated given the patient had no symptoms of chest pain and overall suspicion for ischemic heart disease.  He was continued on verapamil .  In 09/2017 for preprocedural cardiovascular evaluation for colonoscopy he was doing well at that time and noted exertional dyspnea walking up stairs and inclines.  Given his shortness of breath he underwent echocardiogram 10/2017 which showed an LVEF of 55 to 65%, mildly dilated LV with moderate concentric LVH, normal diastolic function, mild biatrial enlargement, mildly to moderately dilated  RV with normal systolic function.  He previously had evaluated in 12/23.  At that time he required surgical clearance.  Overall he stated for the cardiac perspective he was doing well.  Continued to have chronic shortness of breath and dyspnea on exertion stating it had worsened over the last year but denied any other associated symptoms.  He was scheduled for an updated echocardiogram.  He was last seen in clinic 02/06/2024 stating he was doing well from a cardiac perspective.  Denied any episodes of palpitations, chest pain, shortness of breath.  Occasional peripheral edema that he notes in the evening.  Had been compliant with the CPAP.  Stated he had been compliant with this medication regimen.  He was last seen in clinic 06/26/2024 after having elevated coronary calcium  scoring.  Overall he was doing fairly well.  Denied any chest pain chest tightness or chest pressure.  Continues to have ongoing shortness of breath he thinks is slightly worsened over the last 6 months.  He was scheduled for coronary CTA and an updated echocardiogram.    He returns to clinic today stating that he has been doing well from the cardiac perspective.  He denies any chest pain, chest tightness, or palpitations.  Continues to have chronic shortness of breath that is unchanged.  Continues to have peripheral edema that is unchanged.  States that he has been compliant with his current medication regimen without any undue side effects.  Denies any hospitalizations or visits to the emergency department.  ROS: 10 point review of system has been reviewed and considered negative except ones been listed in HPI  Studies Reviewed  2d echo 07/26/2024 1. Left ventricular ejection fraction, by estimation, is 60 to 65%. The  left ventricle has normal function. The left ventricle has no regional  wall motion abnormalities. There is mild left ventricular hypertrophy.  Left ventricular diastolic parameters  are consistent with Grade  I diastolic dysfunction (impaired relaxation).   2. Right ventricular systolic function is normal. The right ventricular  size is normal. Tricuspid regurgitation signal is inadequate for assessing  PA pressure.   3. The mitral valve is normal in structure. No evidence of mitral valve  regurgitation. No evidence of mitral stenosis.   4. The aortic valve is normal in structure. Aortic valve regurgitation is  not visualized. No aortic stenosis is present.   5. The inferior vena cava is normal in size with greater than 50%  respiratory variability, suggesting right atrial pressure of 3 mmHg.   cCTA 07/18/2024 IMPRESSION: 1. Coronary calcium  score of 513. This was 92nd percentile for age-, sex, and race-matched controls.   2. Normal coronary origin with right dominance.   3. Mild atherosclerosis: 25-49% prox LAD/mid D1. Noise artifact limits ability to detect significant soft plaque in mid to distal LAD.   4. Consider non atherosclerotic causes of chest pain.  Calcium  Scoring 06/17/2024 IMPRESSION AND RECOMMENDATION: 1. Coronary calcium  score of 647. This was 94th percentile for age and sex matched control.   2. Moderately dilated main PA to 41mm.   CAC >300 in LAD, RCA. CAC-DRS A3/N2.   CAC-DRS A0: CAC score 0: Statin generally not recommended unless other high risk condition (e.g., FH, DM2, tobacco use, etc.)   CAC-DRS A1: CAC score 1-99: moderate intensity statin   CAC-DRS A2: CAC score 100-299: moderate to high intensity statin + ASA 81mg    CAC-DRS A3: CAC score > 300: high intensity statin + ASA 81mg    Consider cardiology consultation.   Continue heart healthy lifestyle and risk factor modification.   2D echo 07/12/2022 1. Left ventricular ejection fraction, by estimation, is 60 to 65%. The  left ventricle has normal function. The left ventricle has no regional  wall motion abnormalities. There is mild left ventricular hypertrophy.  Left ventricular diastolic  parameters  were normal.   2. Right ventricular systolic function is normal. The right ventricular  size is normal. Tricuspid regurgitation signal is inadequate for assessing  PA pressure.   3. Left atrial size was moderately dilated.   4. The mitral valve is normal in structure. Mild mitral valve  regurgitation. No evidence of mitral stenosis.   5. The aortic valve was not well visualized. Aortic valve regurgitation  is not visualized. No aortic stenosis is present.   6. The inferior vena cava is normal in size with greater than 50%  respiratory variability, suggesting right atrial pressure of 3 mmHg.    2D echo 01/20/2020 1. Left ventricular ejection fraction, by estimation, is 55 to 60%. The  left ventricle has normal function. The left ventricle has no regional  wall motion abnormalities. Left ventricular diastolic parameters were  normal.   2. Right ventricular systolic function is normal. The right ventricular  size is not well visualized.   3. The mitral valve is grossly normal. No evidence of mitral valve  regurgitation.   4. The aortic valve was not well visualized. Aortic valve regurgitation  is not visualized.   Risk Assessment/Calculations           Physical Exam VS:  BP 128/62 (BP Location: Left Arm, Patient Position: Sitting, Cuff Size: Normal)  Pulse 76   Ht 5' 11 (1.803 m)   Wt (!) 406 lb 9.6 oz (184.4 kg)   SpO2 95%   BMI 56.71 kg/m        Wt Readings from Last 3 Encounters:  07/29/24 (!) 406 lb 9.6 oz (184.4 kg)  06/26/24 (!) 405 lb (183.7 kg)  06/06/24 (!) 404 lb 3.2 oz (183.3 kg)    GEN: Well nourished, well developed in no acute distress NECK: No JVD; No carotid bruits CARDIAC: RRR, no murmurs, rubs, gallops RESPIRATORY:  Clear to auscultation without rales, wheezing or rhonchi  ABDOMEN: Soft, non-tender, non-distended EXTREMITIES: Trace pretibial edema; No deformity   ASSESSMENT AND PLAN Nonobstructive coronary artery disease noted on  coronary CTA.  Coronary calcium  score of 513 which is 96 percentile for age and sex matched control.  Mild atherosclerosis 25 to 49% proximal LAD/mid D1 recommendation was to consider nonatherosclerotic causes of chest pain.  He is continued on aspirin  81 mg daily and rosuvastatin  20 mg daily.  Encouraged to continue with lifestyle modification for secondary prevention and aggressive risk factor modification.  Chronic exertional dyspnea that is unchanged.  Previous echocardiogram was completed and revealed an LVEF of 60 to 65%, no RWMA, mild LVH, G1 DD, and no valvular abnormalities were noted.  It is likely considered a component of deconditioning and obesity.  Mixed hyperlipidemia with his last LDL 37.  He continues to be on rosuvastatin  20 mg daily remains a goal of less than 55.  Primary hypertension with blood pressure today 128/62.  Blood pressure remained stable.  He is continued on furosemide  40 mg as needed, losartan  25 mg daily and verapamil  100 mg 1 tablet twice daily.  He has been encouraged to continue to monitor his pressures 1 to 2 hours postmedication administration at home as well.  Paroxysmal 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  He has been continued on verapamil  180 mg twice daily.  Lower extremity edema likely from chronic venous insufficiency.  States that he has swelling throughout the day but once he goes to bed and gets up in the morning it has resolved.  He has been encouraged to participate in conservative therapy of elevating his extremities, foot complaints, decrease edema intake, knee-high compression socks.  Obstructive sleep apnea where he states has been compliant with CPAP.  Type 2 diabetes with his last hemoglobin A1c of 7.5.  He has been continued on Jardiance  and metformin .  Ongoing management per PCP.  Superobesity with a BMI of 56.71.  Overall complicates prognosis.  He would benefit from GLP-1 to assist with weight loss.  Previously prescribed Zepbound  due to his  obesity and his obstructive sleep apnea which was denied by his insurance.       Dispo: Patient to return to clinic see MD/APP in 6 months or sooner if needed for further evaluation.  Signed, Vennesa Bastedo, NP   "

## 2024-07-29 NOTE — Patient Instructions (Signed)
 Medication Instructions:  Your physician recommends that you continue on your current medications as directed. Please refer to the Current Medication list given to you today.   *If you need a refill on your cardiac medications before your next appointment, please call your pharmacy*  Lab Work: No labs ordered today  If you have labs (blood work) drawn today and your tests are completely normal, you will receive your results only by: MyChart Message (if you have MyChart) OR A paper copy in the mail If you have any lab test that is abnormal or we need to change your treatment, we will call you to review the results.  Testing/Procedures: No test ordered today   Follow-Up: At [facility], you and your health needs are our priority.  As part of our continuing mission to provide you with exceptional heart care, our providers are all part of one team.  This team includes your primary Cardiologist (physician) and Advanced Practice Providers or APPs (Physician Assistants and Nurse Practitioners) who all work together to provide you with the care you need, when you need it.  Your next appointment:   6 month(s)  Provider:   You may see Deatrice Cage, MD or one of the following Advanced Practice Providers on your designated Care Team:   Tylene Lunch, NP

## 2024-07-29 NOTE — Progress Notes (Signed)
 Discussed during visit today.

## 2024-08-02 ENCOUNTER — Encounter: Payer: Self-pay | Admitting: *Deleted

## 2024-08-06 ENCOUNTER — Other Ambulatory Visit

## 2024-09-05 ENCOUNTER — Other Ambulatory Visit (HOSPITAL_COMMUNITY): Payer: Self-pay

## 2024-09-10 ENCOUNTER — Ambulatory Visit: Admitting: Family

## 2024-09-11 ENCOUNTER — Other Ambulatory Visit (HOSPITAL_COMMUNITY): Payer: Self-pay

## 2024-09-12 ENCOUNTER — Telehealth: Payer: Self-pay

## 2024-09-12 ENCOUNTER — Other Ambulatory Visit (HOSPITAL_COMMUNITY): Payer: Self-pay

## 2024-09-12 NOTE — Telephone Encounter (Signed)
 Pharmacy Patient Advocate Encounter   Received notification from Onbase CMM KEY that prior authorization for Mounjaro  2.5MG /0.5ML auto-injectors  is required/requested.   Insurance verification completed.   The patient is insured through [employer].   Per test claim: PA required; PA submitted to above mentioned insurance via Latent Key/confirmation #/EOC [id number] Status is pending

## 2024-10-08 ENCOUNTER — Ambulatory Visit: Admitting: Family

## 2025-01-28 ENCOUNTER — Ambulatory Visit: Payer: Self-pay | Admitting: Cardiology
